# Patient Record
Sex: Male | Born: 1965
Health system: Southern US, Community
[De-identification: ages and names within clinical notes are randomized; demographics above are authoritative.]

## PROBLEM LIST (undated history)

## (undated) DIAGNOSIS — E669 Obesity, unspecified: Secondary | ICD-10-CM

## (undated) DIAGNOSIS — Z720 Tobacco use: Secondary | ICD-10-CM

## (undated) DIAGNOSIS — M1990 Unspecified osteoarthritis, unspecified site: Secondary | ICD-10-CM

## (undated) DIAGNOSIS — E785 Hyperlipidemia, unspecified: Secondary | ICD-10-CM

## (undated) HISTORY — DX: Unspecified osteoarthritis, unspecified site: M19.90

## (undated) HISTORY — PX: BACK SURGERY: SHX140

## (undated) HISTORY — DX: Tobacco use: Z72.0

## (undated) HISTORY — DX: Hyperlipidemia, unspecified: E78.5

## (undated) HISTORY — PX: FINGER SURGERY: SHX640

## (undated) HISTORY — DX: Obesity, unspecified: E66.9

## (undated) HISTORY — PX: KNEE SURGERY: SHX244

---

## 2004-03-27 ENCOUNTER — Other Ambulatory Visit: Payer: Self-pay

## 2004-11-22 ENCOUNTER — Emergency Department: Payer: Self-pay | Admitting: Emergency Medicine

## 2005-02-21 ENCOUNTER — Emergency Department: Payer: Self-pay | Admitting: Emergency Medicine

## 2005-02-21 ENCOUNTER — Other Ambulatory Visit: Payer: Self-pay

## 2006-08-05 ENCOUNTER — Emergency Department: Payer: Self-pay | Admitting: Emergency Medicine

## 2006-09-22 ENCOUNTER — Emergency Department: Payer: Self-pay | Admitting: Emergency Medicine

## 2007-07-27 ENCOUNTER — Emergency Department: Payer: Self-pay | Admitting: Emergency Medicine

## 2010-08-05 ENCOUNTER — Emergency Department: Payer: Self-pay | Admitting: Emergency Medicine

## 2014-12-22 DIAGNOSIS — E669 Obesity, unspecified: Secondary | ICD-10-CM | POA: Insufficient documentation

## 2014-12-22 DIAGNOSIS — E785 Hyperlipidemia, unspecified: Secondary | ICD-10-CM | POA: Insufficient documentation

## 2014-12-22 DIAGNOSIS — Z720 Tobacco use: Secondary | ICD-10-CM | POA: Insufficient documentation

## 2015-08-01 ENCOUNTER — Encounter: Payer: Self-pay | Admitting: Family Medicine

## 2015-08-01 ENCOUNTER — Ambulatory Visit (INDEPENDENT_AMBULATORY_CARE_PROVIDER_SITE_OTHER): Payer: 59 | Admitting: Family Medicine

## 2015-08-01 VITALS — BP 119/78 | HR 73 | Temp 96.7°F | Ht 71.1 in | Wt 269.0 lb

## 2015-08-01 DIAGNOSIS — M545 Low back pain, unspecified: Secondary | ICD-10-CM

## 2015-08-01 MED ORDER — IBUPROFEN 600 MG PO TABS: 600.0000 mg | ORAL_TABLET | Freq: Three times a day (TID) | ORAL | Status: DC | PRN

## 2015-08-01 MED ORDER — CYCLOBENZAPRINE HCL 5 MG PO TABS: 5.0000 mg | ORAL_TABLET | Freq: Three times a day (TID) | ORAL | Status: DC | PRN

## 2015-08-01 NOTE — Patient Instructions (Signed)

## 2015-08-01 NOTE — Progress Notes (Signed)
BP 119/78 mmHg  Pulse 73  Temp(Src) 96.7 F (35.9 C)  Ht 5' 11.1" (1.806 m)  Wt 269 lb (122.018 kg)  BMI 37.41 kg/m2  SpO2 98%   Subjective:    Patient ID: Fred Tran, male    DOB: November 22, 1965, 49 y.o.   MRN: [id number]  HPI: Fred RogueDonald R Nygaard is a 49 y.o. male  Chief Complaint  Patient presents with  . Back Pain    No injury that patient is aware of, it has been hurting for about a month off and on now   BACK PAIN Duration: 3+ weeks Mechanism of injury: unknown Location: midline and low back Onset: sudden Severity: moderate Quality: dull and aching Frequency: waxes and wanes Radiation: none Aggravating factors: lifting, laying and bending Alleviating factors: nothing Status: stable Treatments attempted: rest, heat, APAP, ibuprofen and aleve  Relief with NSAIDs?: moderate Nighttime pain:  no Paresthesias / decreased sensation:  no Bowel / bladder incontinence:  no Fevers:  no Dysuria / urinary frequency:  no  Relevant past medical, surgical, family and social history reviewed and updated as indicated. Interim medical history since our last visit reviewed. Allergies and medications reviewed and updated.  Review of Systems  Constitutional: Negative.   Respiratory: Negative.   Cardiovascular: Negative.   Gastrointestinal: Negative.   Genitourinary: Negative.   Musculoskeletal: Positive for myalgias and back pain. Negative for joint swelling, arthralgias, gait problem, neck pain and neck stiffness.  Neurological: Negative.   Psychiatric/Behavioral: Negative.    Per HPI unless specifically indicated above     Objective:    BP 119/78 mmHg  Pulse 73  Temp(Src) 96.7 F (35.9 C)  Ht 5' 11.1" (1.806 m)  Wt 269 lb (122.018 kg)  BMI 37.41 kg/m2  SpO2 98%  Wt Readings from Last 3 Encounters:  08/01/15 269 lb (122.018 kg)  12/03/14 279 lb (126.554 kg)    Physical Exam  Constitutional: He is oriented to person, place, and time. He appears well-developed  and well-nourished. No distress.  HENT:  Head: Normocephalic and atraumatic.  Right Ear: Hearing normal.  Left Ear: Hearing normal.  Nose: Nose normal.  Eyes: Conjunctivae and lids are normal. Right eye exhibits no discharge. Left eye exhibits no discharge. No scleral icterus.  Cardiovascular: Normal rate, regular rhythm, normal heart sounds and intact distal pulses.  Exam reveals no gallop and no friction rub.   No murmur heard. Pulmonary/Chest: Effort normal and breath sounds normal. No respiratory distress. He has no wheezes. He has no rales. He exhibits no tenderness.  Neurological: He is alert and oriented to person, place, and time.  Skin: Skin is warm, dry and intact. No rash noted. No erythema. No pallor.  Psychiatric: He has a normal mood and affect. His speech is normal and behavior is normal. Judgment and thought content normal. Cognition and memory are normal.  Nursing note and vitals reviewed. Back Exam:    Inspection:  Normal spinal curvature.  No deformity, ecchymosis, erythema, or lesions     Palpation:     Midline spinal tenderness: no none      Paralumbar tenderness: yes Left     Parathoracic tenderness: no      Buttocks tenderness: no     Range of Motion:      Flexion: Fingers to Knees     Extension:Decreased     Lateral bending:Decreased to the L, normal to the R    Rotation:Decreased to the L, normal to the R    Neuro Exam:Lower  extremity DTRs normal & symmetric.  Strength and sensation intact.    Special Tests:      Straight leg raise:negative   No results found for this or any previous visit.    Assessment & Plan:   Problem List Items Addressed This Visit    None    Visit Diagnoses    Left-sided low back pain without sciatica    -  Primary    Flexeril and ibuprofen. Exercises and heat. Check in in 2 weeks if not better.     Relevant Medications    ibuprofen (ADVIL,MOTRIN) 600 MG tablet    cyclobenzaprine (FLEXERIL) 5 MG tablet        Follow up  plan: Return in about 2 weeks (around 08/15/2015) for Follow up back pain.

## 2015-08-16 ENCOUNTER — Ambulatory Visit: Payer: 59 | Admitting: Family Medicine

## 2016-03-05 ENCOUNTER — Encounter: Payer: Self-pay | Admitting: Family Medicine

## 2016-03-05 ENCOUNTER — Ambulatory Visit (INDEPENDENT_AMBULATORY_CARE_PROVIDER_SITE_OTHER): Payer: 59 | Admitting: Family Medicine

## 2016-03-05 VITALS — BP 132/68 | HR 59 | Temp 97.8°F | Ht 71.1 in | Wt 274.0 lb

## 2016-03-05 DIAGNOSIS — Z23 Encounter for immunization: Secondary | ICD-10-CM | POA: Diagnosis not present

## 2016-03-05 DIAGNOSIS — Z1211 Encounter for screening for malignant neoplasm of colon: Secondary | ICD-10-CM

## 2016-03-05 DIAGNOSIS — H66001 Acute suppurative otitis media without spontaneous rupture of ear drum, right ear: Secondary | ICD-10-CM

## 2016-03-05 MED ORDER — AMOXICILLIN 875 MG PO TABS: 875.0000 mg | ORAL_TABLET | Freq: Two times a day (BID) | ORAL | Status: DC

## 2016-03-05 MED ORDER — IBUPROFEN 600 MG PO TABS: 600.0000 mg | ORAL_TABLET | Freq: Three times a day (TID) | ORAL | Status: DC | PRN

## 2016-03-05 NOTE — Progress Notes (Signed)
BP 132/68 mmHg  Pulse 59  Temp(Src) 97.8 F (36.6 C)  Ht 5' 11.1" (1.806 m)  Wt 274 lb (124.286 kg)  BMI 38.11 kg/m2  SpO2 97%   Subjective:    Patient ID: Fred Tran, male    DOB: 05-25-1966, 50 y.o.   MRN: [id number]  HPI: Fred Tran is a 50 y.o. male  Chief Complaint  Patient presents with  . URI   UPPER RESPIRATORY TRACT INFECTION Duration: about a week Worst symptom: clogged ears Fever: no Cough: no Shortness of breath: no Wheezing: no Chest pain: no Chest tightness: no Chest congestion: no Nasal congestion: yes Runny nose: yes Post nasal drip: yes Sneezing: yes Sore throat: no Swollen glands: no Sinus pressure: yes Headache: no Face pain: no Toothache: no Ear pain: yes left Ear pressure: yes bilateral Eyes red/itching:no Eye drainage/crusting: no  Vomiting: no Rash: no Fatigue: yes Sick contacts: yes Strep contacts: no  Context: better Recurrent sinusitis: no Relief with OTC cold/cough medications: no  Treatments attempted: none   Relevant past medical, surgical, family and social history reviewed and updated as indicated. Interim medical history since our last visit reviewed. Allergies and medications reviewed and updated.  Review of Systems  Constitutional: Negative.   HENT: Positive for ear pain. Negative for congestion, dental problem, drooling, ear discharge, facial swelling, hearing loss, mouth sores, nosebleeds, postnasal drip, rhinorrhea, sinus pressure, sneezing, sore throat, tinnitus, trouble swallowing and voice change.   Respiratory: Negative.   Cardiovascular: Negative.   Psychiatric/Behavioral: Negative.     Per HPI unless specifically indicated above     Objective:    BP 132/68 mmHg  Pulse 59  Temp(Src) 97.8 F (36.6 C)  Ht 5' 11.1" (1.806 m)  Wt 274 lb (124.286 kg)  BMI 38.11 kg/m2  SpO2 97%  Wt Readings from Last 3 Encounters:  03/05/16 274 lb (124.286 kg)  08/01/15 269 lb (122.018 kg)  12/03/14 279 lb  (126.554 kg)    Physical Exam  Constitutional: He is oriented to person, place, and time. He appears well-developed and well-nourished. No distress.  HENT:  Head: Normocephalic and atraumatic.  Right Ear: Hearing and external ear normal. Tympanic membrane is erythematous and bulging. A middle ear effusion is present.  Left Ear: Hearing, tympanic membrane, external ear and ear canal normal.  Nose: Nose normal.  Mouth/Throat: Uvula is midline, oropharynx is clear and moist and mucous membranes are normal. No oropharyngeal exudate.  Eyes: Conjunctivae, EOM and lids are normal. Pupils are equal, round, and reactive to light. Right eye exhibits no discharge. Left eye exhibits no discharge. No scleral icterus.  Neck: Normal range of motion. Neck supple. No JVD present. No tracheal deviation present. No thyromegaly present.  Cardiovascular: Normal rate, regular rhythm, normal heart sounds and intact distal pulses.  Exam reveals no gallop and no friction rub.   No murmur heard. Pulmonary/Chest: Effort normal and breath sounds normal. No stridor. No respiratory distress. He has no wheezes. He has no rales. He exhibits no tenderness.  Musculoskeletal: Normal range of motion.  Lymphadenopathy:    He has no cervical adenopathy.  Neurological: He is alert and oriented to person, place, and time.  Skin: Skin is warm, dry and intact. No rash noted. He is not diaphoretic. No erythema. No pallor.  Psychiatric: He has a normal mood and affect. His speech is normal and behavior is normal. Judgment and thought content normal. Cognition and memory are normal.  Nursing note and vitals reviewed.   No  results found for this or any previous visit.    Assessment & Plan:   Problem List Items Addressed This Visit    None    Visit Diagnoses    Acute suppurative otitis media of right ear without spontaneous rupture of tympanic membrane, recurrence not specified    -  Primary    Will treat with amoxicillin. Call  if not getting better or getting worse.     Relevant Medications    amoxicillin (AMOXIL) 875 MG tablet    Immunization due        Tdap given today.    Relevant Orders    Tdap vaccine greater than or equal to 7yo IM (Completed)    Screening for colon cancer        Referral given today.    Relevant Orders    Ambulatory referral to General Surgery        Follow up plan: Return if symptoms worsen or fail to improve.

## 2016-03-05 NOTE — Patient Instructions (Signed)
Tdap Vaccine (Tetanus, Diphtheria and Pertussis): What You Need to Know 1. Why get vaccinated? Tetanus, diphtheria and pertussis are very serious diseases. Tdap vaccine can protect us from these diseases. And, Tdap vaccine given to pregnant women can protect newborn babies against pertussis. TETANUS (Lockjaw) is rare in the [place] today. It causes painful muscle tightening and stiffness, usually all over the body.  It can lead to tightening of muscles in the head and neck so you can't open your mouth, swallow, or sometimes even breathe. Tetanus kills about 1 out of 10 people who are infected even after receiving the best medical care. DIPHTHERIA is also rare in the [place] today. It can cause a thick coating to form in the back of the throat.  It can lead to breathing problems, heart failure, paralysis, and death. PERTUSSIS (Whooping Cough) causes severe coughing spells, which can cause difficulty breathing, vomiting and disturbed sleep.  It can also lead to weight loss, incontinence, and rib fractures. Up to 2 in 100 adolescents and 5 in 100 adults with pertussis are hospitalized or have complications, which could include pneumonia or death. These diseases are caused by bacteria. Diphtheria and pertussis are spread from person to person through secretions from coughing or sneezing. Tetanus enters the body through cuts, scratches, or wounds. Before vaccines, as many as 200,000 cases of diphtheria, 200,000 cases of pertussis, and hundreds of cases of tetanus, were reported in the [place] each year. Since vaccination began, reports of cases for tetanus and diphtheria have dropped by about 99% and for pertussis by about 80%. 2. Tdap vaccine Tdap vaccine can protect adolescents and adults from tetanus, diphtheria, and pertussis. One dose of Tdap is routinely given at age 11 or 12. People who did not get Tdap at that age should get it as soon as possible. Tdap is especially important  for healthcare professionals and anyone having close contact with a baby younger than 12 months. Pregnant women should get a dose of Tdap during every pregnancy, to protect the newborn from pertussis. Infants are most at risk for severe, life-threatening complications from pertussis. Another vaccine, called Td, protects against tetanus and diphtheria, but not pertussis. A Td booster should be given every 10 years. Tdap may be given as one of these boosters if you have never gotten Tdap before. Tdap may also be given after a severe cut or burn to prevent tetanus infection. Your doctor or the person giving you the vaccine can give you more information. Tdap may safely be given at the same time as other vaccines. 3. Some people should not get this vaccine  A person who has ever had a life-threatening allergic reaction after a previous dose of any diphtheria, tetanus or pertussis containing vaccine, OR has a severe allergy to any part of this vaccine, should not get Tdap vaccine. Tell the person giving the vaccine about any severe allergies.  Anyone who had coma or long repeated seizures within 7 days after a childhood dose of DTP or DTaP, or a previous dose of Tdap, should not get Tdap, unless a cause other than the vaccine was found. They can still get Td.  Talk to your doctor if you:  have seizures or another nervous system problem,  had severe pain or swelling after any vaccine containing diphtheria, tetanus or pertussis,  ever had a condition called Guillain-Barr Syndrome (GBS),  aren't feeling well on the day the shot is scheduled. 4. Risks With any medicine, including vaccines, there is   a chance of side effects. These are usually mild and go away on their own. Serious reactions are also possible but are rare. Most people who get Tdap vaccine do not have any problems with it. Mild problems following Tdap (Did not interfere with activities)  Pain where the shot was given (about 3 in 4  adolescents or 2 in 3 adults)  Redness or swelling where the shot was given (about 1 person in 5)  Mild fever of at least 100.4F (up to about 1 in 25 adolescents or 1 in 100 adults)  Headache (about 3 or 4 people in 10)  Tiredness (about 1 person in 3 or 4)  Nausea, vomiting, diarrhea, stomach ache (up to 1 in 4 adolescents or 1 in 10 adults)  Chills, sore joints (about 1 person in 10)  Body aches (about 1 person in 3 or 4)  Rash, swollen glands (uncommon) Moderate problems following Tdap (Interfered with activities, but did not require medical attention)  Pain where the shot was given (up to 1 in 5 or 6)  Redness or swelling where the shot was given (up to about 1 in 16 adolescents or 1 in 12 adults)  Fever over 102F (about 1 in 100 adolescents or 1 in 250 adults)  Headache (about 1 in 7 adolescents or 1 in 10 adults)  Nausea, vomiting, diarrhea, stomach ache (up to 1 or 3 people in 100)  Swelling of the entire arm where the shot was given (up to about 1 in 500). Severe problems following Tdap (Unable to perform usual activities; required medical attention)  Swelling, severe pain, bleeding and redness in the arm where the shot was given (rare). Problems that could happen after any vaccine:  People sometimes faint after a medical procedure, including vaccination. Sitting or lying down for about 15 minutes can help prevent fainting, and injuries caused by a fall. Tell your doctor if you feel dizzy, or have vision changes or ringing in the ears.  Some people get severe pain in the shoulder and have difficulty moving the arm where a shot was given. This happens very rarely.  Any medication can cause a severe allergic reaction. Such reactions from a vaccine are very rare, estimated at fewer than 1 in a million doses, and would happen within a few minutes to a few hours after the vaccination. As with any medicine, there is a very remote chance of a vaccine causing a serious  injury or death. The safety of vaccines is always being monitored. For more information, visit: www.cdc.gov/vaccinesafety/ 5. What if there is a serious problem? What should I look for?  Look for anything that concerns you, such as signs of a severe allergic reaction, very high fever, or unusual behavior.  Signs of a severe allergic reaction can include hives, swelling of the face and throat, difficulty breathing, a fast heartbeat, dizziness, and weakness. These would usually start a few minutes to a few hours after the vaccination. What should I do?  If you think it is a severe allergic reaction or other emergency that can't wait, call 9-1-1 or get the person to the nearest hospital. Otherwise, call your doctor.  Afterward, the reaction should be reported to the Vaccine Adverse Event Reporting System (VAERS). Your doctor might file this report, or you can do it yourself through the VAERS web site at www.vaers.hhs.gov, or by calling 1-800-822-7967. VAERS does not give medical advice.  6. The National Vaccine Injury Compensation Program The National Vaccine Injury Compensation Program (  VICP) is a federal program that was created to compensate people who may have been injured by certain vaccines. Persons who believe they may have been injured by a vaccine can learn about the program and about filing a claim by calling 1-430-786-4900 or visiting the VICP website at SpiritualWord.atwww.hrsa.gov/vaccinecompensation. There is a time limit to file a claim for compensation. 7. How can I learn more?  Ask your doctor. He or she can give you the vaccine package insert or suggest other sources of information.  Call your local or state health department.  Contact the Centers for Disease Control and Prevention (CDC):  Call (825)574-13591-810 551 4897 (1-800-CDC-INFO) or  Visit CDC's website at PicCapture.uywww.cdc.gov/vaccines CDC Tdap Vaccine VIS (11/03/13)   This information is not intended to replace advice given to you by your health care  provider. Make sure you discuss any questions you have with your health care provider.   Document Released: 02/26/2012 Document Revised: 09/17/2014 Document Reviewed: 12/09/2013 Elsevier Interactive Patient Education 2016 Elsevier Inc. Otitis Media, Adult Otitis media is redness, soreness, and puffiness (swelling) in the space just behind your eardrum (middle ear). It may be caused by allergies or infection. It often happens along with a cold. HOME CARE  Take your medicine as told. Finish it even if you start to feel better.  Only take over-the-counter or prescription medicines for pain, discomfort, or fever as told by your doctor.  Follow up with your doctor as told. GET HELP IF:  You have otitis media only in one ear, or bleeding from your nose, or both.  You notice a lump on your neck.  You are not getting better in 3-5 days.  You feel worse instead of better. GET HELP RIGHT AWAY IF:   You have pain that is not helped with medicine.  You have puffiness, redness, or pain around your ear.  You get a stiff neck.  You cannot move part of your face (paralysis).  You notice that the bone behind your ear hurts when you touch it. MAKE SURE YOU:   Understand these instructions.  Will watch your condition.  Will get help right away if you are not doing well or get worse.   This information is not intended to replace advice given to you by your health care provider. Make sure you discuss any questions you have with your health care provider.   Document Released: 02/13/2008 Document Revised: 09/17/2014 Document Reviewed: 03/24/2013 Elsevier Interactive Patient Education [employer]2016 Elsevier Inc.

## 2016-06-19 ENCOUNTER — Other Ambulatory Visit: Payer: Self-pay | Admitting: Family Medicine

## 2016-08-08 ENCOUNTER — Ambulatory Visit (INDEPENDENT_AMBULATORY_CARE_PROVIDER_SITE_OTHER): Payer: 59

## 2016-08-08 DIAGNOSIS — Z23 Encounter for immunization: Secondary | ICD-10-CM | POA: Diagnosis not present

## 2016-10-02 ENCOUNTER — Ambulatory Visit (INDEPENDENT_AMBULATORY_CARE_PROVIDER_SITE_OTHER): Payer: 59 | Admitting: Family Medicine

## 2016-10-02 ENCOUNTER — Encounter: Payer: Self-pay | Admitting: Family Medicine

## 2016-10-02 ENCOUNTER — Other Ambulatory Visit: Payer: Self-pay

## 2016-10-02 VITALS — BP 138/70 | HR 75 | Temp 97.2°F | Wt 281.0 lb

## 2016-10-02 DIAGNOSIS — H1031 Unspecified acute conjunctivitis, right eye: Secondary | ICD-10-CM

## 2016-10-02 MED ORDER — ERYTHROMYCIN 5 MG/GM OP OINT: 1.0000 "application " | TOPICAL_OINTMENT | Freq: Every day | OPHTHALMIC | 0 refills | Status: DC

## 2016-10-02 NOTE — Patient Instructions (Signed)
Follow up as needed

## 2016-10-02 NOTE — Progress Notes (Signed)
   BP 138/70   Pulse 75   Temp 97.2 F (36.2 C)   Wt 281 lb (127.5 kg)   SpO2 97%   BMI 39.08 kg/m    Subjective:    Patient ID: Fred Tran, male    DOB: [birth year], 51 y.o.   MRN: [id number]  HPI: Fred RogueDonald R Bouldin is a 51 y.o. male  Chief Complaint  Patient presents with  . Eye Pain    x 3 days, right eye. swollen, matted up with drainage, no burning, no pain.    Patient presents with 3 day history of right eye swelling, redness, and thick drainage. States eye is crusted over in the mornings. Denies visual disturbance, HA, N/V. Has not been trying anything OTC for sxs. No sick contacts that he is aware of.   Relevant past medical, surgical, family and social history reviewed and updated as indicated. Interim medical history since our last visit reviewed. Allergies and medications reviewed and updated.  Review of Systems  Constitutional: Negative.   HENT: Negative.   Eyes: Positive for discharge and redness.  Respiratory: Negative.   Cardiovascular: Negative.   Gastrointestinal: Negative.   Genitourinary: Negative.   Musculoskeletal: Negative.   Neurological: Negative.   Psychiatric/Behavioral: Negative.     Per HPI unless specifically indicated above     Objective:    BP 138/70   Pulse 75   Temp 97.2 F (36.2 C)   Wt 281 lb (127.5 kg)   SpO2 97%   BMI 39.08 kg/m   Wt Readings from Last 3 Encounters:  10/02/16 281 lb (127.5 kg)  03/05/16 274 lb (124.3 kg)  08/01/15 269 lb (122 kg)    Physical Exam  Constitutional: He is oriented to person, place, and time. He appears well-developed and well-nourished. No distress.  HENT:  Head: Atraumatic.  Right Ear: External ear normal.  Left Ear: External ear normal.  Mouth/Throat: Oropharynx is clear and moist.  Eyes: EOM are normal. Pupils are equal, round, and reactive to light. No scleral icterus.  Right conjunctiva erythematous Some crusting on lashes of right eye Mild lower right lid edema    Cardiovascular: Normal rate.   Pulmonary/Chest: Effort normal. No respiratory distress.  Musculoskeletal: Normal range of motion.  Neurological: He is alert and oriented to person, place, and time.  Skin: Skin is warm and dry.  Psychiatric: He has a normal mood and affect. His behavior is normal.  Nursing note and vitals reviewed.   Visual Acuity: without correction R - 20/20 L - 20/20  No results found for this or any previous visit.    Assessment & Plan:   Problem List Items Addressed This Visit    None    Visit Diagnoses    Acute bacterial conjunctivitis of right eye    -  Primary   Will treat with erythromycin ointment. Discussed compresses prn for comfort. Follow up if worsening or no improvement. Return precautions discussed       Follow up plan: Return if symptoms worsen or fail to improve.

## 2016-10-22 ENCOUNTER — Other Ambulatory Visit: Payer: Self-pay | Admitting: Family Medicine

## 2016-10-30 ENCOUNTER — Other Ambulatory Visit: Payer: Self-pay | Admitting: Family Medicine

## 2016-10-30 ENCOUNTER — Telehealth: Payer: Self-pay | Admitting: Family Medicine

## 2016-10-30 MED ORDER — VARENICLINE TARTRATE 1 MG PO TABS: 1.0000 mg | ORAL_TABLET | Freq: Two times a day (BID) | ORAL | 4 refills | Status: DC

## 2016-10-30 MED ORDER — VARENICLINE TARTRATE 0.5 MG X 11 & 1 MG X 42 PO MISC: ORAL | 0 refills | Status: DC

## 2016-10-30 NOTE — Telephone Encounter (Signed)
Patient would like a script  for Chantix (unsure of sp).  Thanks   [phone]

## 2016-10-31 NOTE — Telephone Encounter (Signed)
Left message on patients designated phone chantix was sent in to [facility].

## 2016-11-08 ENCOUNTER — Ambulatory Visit (INDEPENDENT_AMBULATORY_CARE_PROVIDER_SITE_OTHER): Payer: 59 | Admitting: Family Medicine

## 2016-11-08 ENCOUNTER — Encounter: Payer: Self-pay | Admitting: Family Medicine

## 2016-11-08 ENCOUNTER — Telehealth: Payer: Self-pay

## 2016-11-08 VITALS — BP 146/76 | HR 88 | Temp 99.2°F | Resp 17 | Ht 71.1 in | Wt 278.0 lb

## 2016-11-08 DIAGNOSIS — J0100 Acute maxillary sinusitis, unspecified: Secondary | ICD-10-CM

## 2016-11-08 MED ORDER — AMOXICILLIN-POT CLAVULANATE 875-125 MG PO TABS: 1.0000 | ORAL_TABLET | Freq: Two times a day (BID) | ORAL | 0 refills | Status: DC

## 2016-11-08 NOTE — Telephone Encounter (Signed)
[facility] fax from [facility] about Chantix starting month needing P.A. Rochel BromeP.A. Was initiated via [email or web address] Case ID: [id number]

## 2016-11-08 NOTE — Progress Notes (Signed)
BP (!) 146/76 (BP Location: Right Arm, Patient Position: Sitting, Cuff Size: Large)   Pulse 88   Temp 99.2 F (37.3 C) (Oral)   Resp 17   Ht 5' 11.1" (1.806 m)   Wt 278 lb (126.1 kg)   SpO2 98%   BMI 38.66 kg/m    Subjective:    Patient ID: Fred Tran, male    DOB: 04/23/1966, 51 y.o.   MRN: [id number]  HPI: Fred Tran is a 51 y.o. male  Chief Complaint  Patient presents with  . Sinusitis    Left side onset 3 days   UPPER RESPIRATORY TRACT INFECTION Duration: 3 days Worst symptom: congestion, ear pain- all on the L Fever: yes- subjective Cough: yes Shortness of breath: no Wheezing: no Chest pain: no Chest tightness: no Chest congestion: no Nasal congestion: yes Runny nose: yes Post nasal drip: yes Sneezing: yes Sore throat: no Swollen glands: no Sinus pressure: yes Headache: no Face pain: yes Toothache: no Ear pain: yes left Ear pressure: yes left Eyes red/itching:no Eye drainage/crusting: no  Vomiting: no Rash: no Fatigue: yes Sick contacts: no Strep contacts: no  Context: worse Recurrent sinusitis: no Relief with OTC cold/cough medications: no  Treatments attempted: tylenol   Relevant past medical, surgical, family and social history reviewed and updated as indicated. Interim medical history since our last visit reviewed. Allergies and medications reviewed and updated.  Review of Systems  Constitutional: Positive for fatigue and fever. Negative for activity change, appetite change, chills, diaphoresis and unexpected weight change.  HENT: Positive for congestion, ear pain, postnasal drip, rhinorrhea, sinus pain, sinus pressure and sneezing. Negative for dental problem, drooling, ear discharge, facial swelling, hearing loss, mouth sores, nosebleeds, sore throat, tinnitus, trouble swallowing and voice change.   Respiratory: Negative.   Cardiovascular: Negative.   Psychiatric/Behavioral: Negative.     Per HPI unless specifically  indicated above     Objective:    BP (!) 146/76 (BP Location: Right Arm, Patient Position: Sitting, Cuff Size: Large)   Pulse 88   Temp 99.2 F (37.3 C) (Oral)   Resp 17   Ht 5' 11.1" (1.806 m)   Wt 278 lb (126.1 kg)   SpO2 98%   BMI 38.66 kg/m   Wt Readings from Last 3 Encounters:  11/08/16 278 lb (126.1 kg)  10/02/16 281 lb (127.5 kg)  03/05/16 274 lb (124.3 kg)    Physical Exam  Constitutional: He is oriented to person, place, and time. He appears well-developed and well-nourished. No distress.  HENT:  Head: Normocephalic and atraumatic.  Right Ear: Hearing and external ear normal.  Left Ear: Hearing and external ear normal.  Nose: Mucosal edema, rhinorrhea and sinus tenderness present. No nose lacerations or nasal deformity. Right sinus exhibits no maxillary sinus tenderness. Left sinus exhibits maxillary sinus tenderness. Left sinus exhibits no frontal sinus tenderness.  Mouth/Throat: Oropharynx is clear and moist. No oropharyngeal exudate.  Pus coming out of meatus on the L maxillary sinus  Eyes: Conjunctivae, EOM and lids are normal. Pupils are equal, round, and reactive to light. Right eye exhibits no discharge. Left eye exhibits no discharge. No scleral icterus.  Neck: Normal range of motion. Neck supple. No JVD present. No tracheal deviation present. No thyromegaly present.  Cardiovascular: Normal rate, regular rhythm, normal heart sounds and intact distal pulses.  Exam reveals no gallop and no friction rub.   No murmur heard. Pulmonary/Chest: Effort normal. No stridor. No respiratory distress. He has wheezes. He has  no rales. He exhibits no tenderness.  Musculoskeletal: Normal range of motion.  Lymphadenopathy:    He has cervical adenopathy.  Neurological: He is alert and oriented to person, place, and time.  Skin: Skin is warm, dry and intact. No rash noted. He is not diaphoretic. No erythema. No pallor.  Psychiatric: He has a normal mood and affect. His speech is  normal and behavior is normal. Judgment and thought content normal. Cognition and memory are normal.  Vitals reviewed.   No results found for this or any previous visit.    Assessment & Plan:   Problem List Items Addressed This Visit    None    Visit Diagnoses    Acute non-recurrent maxillary sinusitis    -  Primary   Will treat with augmentin. Call with any concerns or if not getting better or getting worse.    Relevant Medications   amoxicillin-clavulanate (AUGMENTIN) 875-125 MG tablet       Follow up plan: Return if symptoms worsen or fail to improve.

## 2016-11-22 ENCOUNTER — Other Ambulatory Visit: Payer: Self-pay | Admitting: Family Medicine

## 2017-04-08 ENCOUNTER — Other Ambulatory Visit: Payer: Self-pay | Admitting: Family Medicine

## 2017-07-25 ENCOUNTER — Ambulatory Visit (INDEPENDENT_AMBULATORY_CARE_PROVIDER_SITE_OTHER): Payer: Self-pay

## 2017-07-25 DIAGNOSIS — Z23 Encounter for immunization: Secondary | ICD-10-CM

## 2017-11-14 ENCOUNTER — Ambulatory Visit (INDEPENDENT_AMBULATORY_CARE_PROVIDER_SITE_OTHER): Payer: BLUE CROSS/BLUE SHIELD | Admitting: Family Medicine

## 2017-11-14 ENCOUNTER — Encounter: Payer: Self-pay | Admitting: Family Medicine

## 2017-11-14 VITALS — BP 162/75 | HR 78 | Temp 97.8°F | Wt 289.2 lb

## 2017-11-14 DIAGNOSIS — J0120 Acute ethmoidal sinusitis, unspecified: Secondary | ICD-10-CM

## 2017-11-14 MED ORDER — AMOXICILLIN-POT CLAVULANATE 875-125 MG PO TABS: 1.0000 | ORAL_TABLET | Freq: Two times a day (BID) | ORAL | 0 refills | Status: DC

## 2017-11-14 NOTE — Progress Notes (Signed)
   BP (!) 162/75   Pulse 78   Temp 97.8 F (36.6 C) (Oral)   Wt 289 lb 3.2 oz (131.2 kg)   SpO2 97%   BMI 40.22 kg/m    Subjective:    Patient ID: Fred Tran, male    DOB: 06-09-1966, 52 y.o.   MRN: [id number]  HPI: Fred Tran is a 52 y.o. male  Chief Complaint  Patient presents with  . URI    pt states he has had a cough, congestion, and sinus pressure for a few weeks   Congestion, facial pain and pressure worst on the left, headaches, ear pain x 2-3 weeks. Denies fever, chills, CP, SOB, body aches. Not taking anything OTC for sxs. Lots of sick family members lately.   Relevant past medical, surgical, family and social history reviewed and updated as indicated. Interim medical history since our last visit reviewed. Allergies and medications reviewed and updated.  Review of Systems  Per HPI unless specifically indicated above     Objective:    BP (!) 162/75   Pulse 78   Temp 97.8 F (36.6 C) (Oral)   Wt 289 lb 3.2 oz (131.2 kg)   SpO2 97%   BMI 40.22 kg/m   Wt Readings from Last 3 Encounters:  11/14/17 289 lb 3.2 oz (131.2 kg)  11/08/16 278 lb (126.1 kg)  10/02/16 281 lb (127.5 kg)    Physical Exam  Constitutional: He is oriented to person, place, and time. He appears well-developed and well-nourished. No distress.  HENT:  Head: Atraumatic.  B/l middle ear effusion Ethmoidal sinus ttp Oropharynx and nasal mucosa erythematous and edematous  Eyes: Conjunctivae are normal. Pupils are equal, round, and reactive to light. No scleral icterus.  Neck: Normal range of motion. Neck supple.  Cardiovascular: Normal rate and normal heart sounds.  Pulmonary/Chest: Effort normal and breath sounds normal. He has no wheezes.  Musculoskeletal: Normal range of motion.  Neurological: He is alert and oriented to person, place, and time.  Skin: Skin is warm and dry.  Psychiatric: He has a normal mood and affect. His behavior is normal.  Nursing note and vitals  reviewed.   No results found for this or any previous visit.    Assessment & Plan:   Problem List Items Addressed This Visit    None    Visit Diagnoses    Acute ethmoidal sinusitis, recurrence not specified    -  Primary   Will tx with augmentin, take mucinex BID OTC and use humidifier, sinus rinses, flonase. F/u if worsening or no improvement   Relevant Medications   amoxicillin-clavulanate (AUGMENTIN) 875-125 MG tablet       Follow up plan: Return if symptoms worsen or fail to improve.

## 2017-11-17 NOTE — Patient Instructions (Signed)
Follow up as needed

## 2018-02-19 ENCOUNTER — Other Ambulatory Visit: Payer: Self-pay | Admitting: Family Medicine

## 2018-05-02 ENCOUNTER — Encounter: Payer: BLUE CROSS/BLUE SHIELD | Admitting: Unknown Physician Specialty

## 2018-06-19 ENCOUNTER — Encounter: Payer: Self-pay | Admitting: Family Medicine

## 2018-06-19 ENCOUNTER — Ambulatory Visit (INDEPENDENT_AMBULATORY_CARE_PROVIDER_SITE_OTHER): Payer: BLUE CROSS/BLUE SHIELD | Admitting: Family Medicine

## 2018-06-19 ENCOUNTER — Ambulatory Visit
Admission: RE | Admit: 2018-06-19 | Discharge: 2018-06-19 | Disposition: A | Discharge status: Home / Self Care | Payer: BLUE CROSS/BLUE SHIELD | Source: Ambulatory Visit | Attending: Family Medicine | Admitting: Family Medicine

## 2018-06-19 VITALS — BP 167/73 | HR 65 | Temp 98.6°F | Wt 286.2 lb

## 2018-06-19 DIAGNOSIS — M5441 Lumbago with sciatica, right side: Secondary | ICD-10-CM

## 2018-06-19 DIAGNOSIS — M25551 Pain in right hip: Secondary | ICD-10-CM | POA: Diagnosis not present

## 2018-06-19 DIAGNOSIS — Z23 Encounter for immunization: Secondary | ICD-10-CM | POA: Diagnosis not present

## 2018-06-19 DIAGNOSIS — M47816 Spondylosis without myelopathy or radiculopathy, lumbar region: Secondary | ICD-10-CM | POA: Diagnosis not present

## 2018-06-19 DIAGNOSIS — Z1211 Encounter for screening for malignant neoplasm of colon: Secondary | ICD-10-CM | POA: Diagnosis not present

## 2018-06-19 DIAGNOSIS — M419 Scoliosis, unspecified: Secondary | ICD-10-CM | POA: Insufficient documentation

## 2018-06-19 DIAGNOSIS — M48061 Spinal stenosis, lumbar region without neurogenic claudication: Secondary | ICD-10-CM | POA: Diagnosis not present

## 2018-06-19 MED ORDER — TRAMADOL HCL 50 MG PO TABS: 50.0000 mg | ORAL_TABLET | Freq: Three times a day (TID) | ORAL | 0 refills | Status: AC | PRN

## 2018-06-19 NOTE — Progress Notes (Signed)
BP (!) 167/73 (BP Location: Left Arm, Patient Position: Sitting, Cuff Size: Large)   Pulse 65   Temp 98.6 F (37 C)   Wt 286 lb 3 oz (129.8 kg)   SpO2 98%   BMI 39.80 kg/m    Subjective:    Patient ID: Fred Tran, male    DOB: 24-Oct-1965, 52 y.o.   MRN: [id number]  HPI: Fred Tran is a 52 y.o. male  Chief Complaint  Patient presents with  . Hip Pain   HIP PAIN Duration: 2 weeks Involved hip: right  Mechanism of injury: unknown Location: in the joint Onset: sudden  Severity: sitting 3/10, walking is > than that  Quality: burning Frequency: constant Radiation: yes Aggravating factors: weight bearing   Alleviating factors: staying of it   Status: stable Treatments attempted: rest, heat and ibuprofen   Relief with NSAIDs?: no Weakness with weight bearing: yes Weakness with walking: yes Paresthesias / decreased sensation: no Swelling: no Redness:no Fevers: no  Relevant past medical, surgical, family and social history reviewed and updated as indicated. Interim medical history since our last visit reviewed. Allergies and medications reviewed and updated.  Review of Systems  Constitutional: Negative.   Respiratory: Negative.   Cardiovascular: Negative.   Musculoskeletal: Positive for arthralgias, gait problem and myalgias. Negative for back pain, joint swelling, neck pain and neck stiffness.  Skin: Negative.   Psychiatric/Behavioral: Negative.     Per HPI unless specifically indicated above     Objective:    BP (!) 167/73 (BP Location: Left Arm, Patient Position: Sitting, Cuff Size: Large)   Pulse 65   Temp 98.6 F (37 C)   Wt 286 lb 3 oz (129.8 kg)   SpO2 98%   BMI 39.80 kg/m   Wt Readings from Last 3 Encounters:  06/19/18 286 lb 3 oz (129.8 kg)  11/14/17 289 lb 3.2 oz (131.2 kg)  11/08/16 278 lb (126.1 kg)    Physical Exam  Constitutional: He is oriented to person, place, and time. He appears well-developed and well-nourished. No  distress.  HENT:  Head: Normocephalic and atraumatic.  Right Ear: Hearing normal.  Left Ear: Hearing normal.  Nose: Nose normal.  Eyes: Conjunctivae and lids are normal. Right eye exhibits no discharge. Left eye exhibits no discharge. No scleral icterus.  Cardiovascular: Normal rate, regular rhythm, normal heart sounds and intact distal pulses. Exam reveals no gallop and no friction rub.  No murmur heard. Pulmonary/Chest: Effort normal and breath sounds normal. No stridor. No respiratory distress. He has no wheezes. He has no rales. He exhibits no tenderness.  Musculoskeletal: He exhibits tenderness. He exhibits no edema or deformity.  Neurological: He is alert and oriented to person, place, and time.  Skin: Skin is warm, dry and intact. Capillary refill takes less than 2 seconds. No rash noted. He is not diaphoretic. No erythema. No pallor.  Psychiatric: He has a normal mood and affect. His speech is normal and behavior is normal. Judgment and thought content normal. Cognition and memory are normal.  Nursing note and vitals reviewed. Hip Exam: Right     Tenderness to palpation: yes     Greater trochanter: no      Anterior superior iliac spine: no     Anterior hip: yes     Iliac crest: no     Iliac tubercle: no     Pubic tubercle: no     SI joint: yes      Range of Motion:  Flexion: Decreased    Extension: Decreased    Abduction: Decreased    Adduction: Decreased    Internal rotation: Decreased    External rotation: Decreased     Muscle Strength:  5/5 bilaterally  Back Exam:    Inspection:  Normal spinal curvature.  No deformity, ecchymosis, erythema, or lesions      Palpation:     Midline spinal tenderness: no      Paralumbar tenderness: no      Parathoracic tenderness: no      Buttocks tenderness: no     Range of Motion:      Flexion: Fingers to Knees     Extension:Decreased     Lateral bending:Decreased    Rotation:Decreased    Neuro Exam:Lower extremity DTRs  normal & symmetric.  Strength and sensation intact.    Special Tests:      Straight leg raise:negative   No results found for this or any previous visit.    Assessment & Plan:   Problem List Items Addressed This Visit    None    Visit Diagnoses    Right hip pain    -  Primary   In significant discomfort. Will obtain x-rays. Await results. Out of work for the next few days. Rx for tramadol provided. Call with concerns.    Relevant Orders   DG HIP UNILAT WITH PELVIS 2-3 VIEWS RIGHT   Screening for colon cancer       Referral to GI made today.   Relevant Orders   Ambulatory referral to Gastroenterology   Immunization due       Flu shot given today.   Relevant Orders   Flu Vaccine QUAD 6+ mos PF IM (Fluarix Quad PF) (Completed)   Acute right-sided low back pain with right-sided sciatica       n significant discomfort. Will obtain x-rays. Await results. Out of work for the next few days. Rx for tramadol provided. Call with concerns.    Relevant Medications   traMADol (ULTRAM) 50 MG tablet   Other Relevant Orders   DG Lumbar Spine Complete       Follow up plan: Return in about 2 weeks (around 07/03/2018) for Follow up pain.

## 2018-06-20 ENCOUNTER — Telehealth: Payer: Self-pay | Admitting: Family Medicine

## 2018-06-20 DIAGNOSIS — M5440 Lumbago with sciatica, unspecified side: Principal | ICD-10-CM

## 2018-06-20 DIAGNOSIS — M545 Low back pain, unspecified: Secondary | ICD-10-CM

## 2018-06-20 NOTE — Telephone Encounter (Signed)
Please let him know that his x-rays of his hip were normal, but his back shows arthritis. I'd like to get him into some PT to see if we can help with the pain.

## 2018-06-20 NOTE — Telephone Encounter (Signed)
Left message on machine for pt to return call to the office. CRM created.  

## 2018-06-20 NOTE — Telephone Encounter (Signed)
Patient notified of results- he would like to have the referral to PT to see if it would help.

## 2018-06-20 NOTE — Telephone Encounter (Signed)
Referral placed.

## 2018-06-23 ENCOUNTER — Other Ambulatory Visit: Payer: Self-pay

## 2018-06-23 DIAGNOSIS — Z1211 Encounter for screening for malignant neoplasm of colon: Secondary | ICD-10-CM

## 2018-07-07 ENCOUNTER — Ambulatory Visit
Admission: RE | Admit: 2018-07-07 | Payer: BLUE CROSS/BLUE SHIELD | Source: Ambulatory Visit | Admitting: Gastroenterology

## 2018-07-07 ENCOUNTER — Encounter: Payer: Self-pay | Admitting: Registered Nurse

## 2018-07-07 ENCOUNTER — Encounter: Admission: RE | Payer: Self-pay | Source: Ambulatory Visit

## 2018-07-07 DIAGNOSIS — Z1211 Encounter for screening for malignant neoplasm of colon: Secondary | ICD-10-CM

## 2018-07-07 SURGERY — COLONOSCOPY WITH PROPOFOL
Anesthesia: General

## 2018-11-11 ENCOUNTER — Other Ambulatory Visit: Payer: Self-pay | Admitting: Family Medicine

## 2018-11-11 MED ORDER — OSELTAMIVIR PHOSPHATE 75 MG PO CAPS: 75.0000 mg | ORAL_CAPSULE | Freq: Every day | ORAL | 0 refills | Status: DC

## 2019-04-03 ENCOUNTER — Ambulatory Visit (INDEPENDENT_AMBULATORY_CARE_PROVIDER_SITE_OTHER): Payer: Self-pay | Admitting: Family Medicine

## 2019-04-03 ENCOUNTER — Encounter: Payer: Self-pay | Admitting: Family Medicine

## 2019-04-03 ENCOUNTER — Other Ambulatory Visit: Payer: Self-pay

## 2019-04-03 VITALS — Temp 98.5°F

## 2019-04-03 DIAGNOSIS — F4323 Adjustment disorder with mixed anxiety and depressed mood: Secondary | ICD-10-CM | POA: Insufficient documentation

## 2019-04-03 MED ORDER — HYDROXYZINE HCL 25 MG PO TABS: 25.0000 mg | ORAL_TABLET | Freq: Three times a day (TID) | ORAL | 3 refills | Status: DC | PRN

## 2019-04-03 MED ORDER — SERTRALINE HCL 50 MG PO TABS: ORAL_TABLET | ORAL | 3 refills | Status: DC

## 2019-04-03 NOTE — Progress Notes (Signed)
Temp 98.5 F (36.9 C) (Oral) Comment: pt reported   Subjective:    Patient ID: Fred Tran, male    DOB: Nov 30, 1965, 53 y.o.   MRN: [id number]  HPI: Fred Tran is a 53 y.o. male  Chief Complaint  Patient presents with  . Anxiety   ANXIETY/STRESS- new, was changing jobs in February and with everything that's been going on, has been having a lot more anxiety due to the COVID pandemic Duration:5 months Anxious mood: yes  Excessive worrying: yes Irritability: yes  Sweating: no Nausea: no Palpitations:yes Hyperventilation: no Panic attacks: no Agoraphobia: yes  Obscessions/compulsions: yes Depressed mood: yes [facility] 04/03/2019 03/05/2016  Decreased Interest 2 0  Down, Depressed, Hopeless 2 0  PHQ - 2 Score 4 0  Altered sleeping 1 -  Tired, decreased energy 1 -  Change in appetite 2 -  Feeling bad or failure about yourself  2 -  Trouble concentrating 1 -  Moving slowly or fidgety/restless 0 -  Suicidal thoughts 0 -  PHQ-9 Score 11 -  Difficult doing work/chores Extremely dIfficult -   GAD 7 : Generalized Anxiety Score 04/03/2019  Nervous, Anxious, on Edge 1  Control/stop worrying 3  Worry too much - different things 3  Trouble relaxing 1  Restless 1  Easily annoyed or irritable 1  Afraid - awful might happen 3  Total GAD 7 Score 13  Anxiety Difficulty Extremely difficult   Anhedonia: no Weight changes: no Insomnia: yes hard to fall asleep  Hypersomnia: no Fatigue/loss of energy: yes Feelings of worthlessness: no Feelings of guilt: yes Impaired concentration/indecisiveness: no Suicidal ideations: no  Crying spells: yes Recent Stressors/Life Changes: yes   Relationship problems: no   Family stress: yes     Financial stress: yes    Job stress: yes    Recent death/loss: no  Relevant past medical, surgical, family and social history reviewed and updated as indicated. Interim medical history since our last visit reviewed.  Allergies and medications reviewed and updated.  Review of Systems  Constitutional: Negative.   Respiratory: Negative.   Cardiovascular: Negative.   Gastrointestinal: Negative.   Musculoskeletal: Negative.   Skin: Negative.   Neurological: Negative.   Psychiatric/Behavioral: Positive for dysphoric mood and sleep disturbance. Negative for agitation, behavioral problems, confusion, decreased concentration, hallucinations, self-injury and suicidal ideas. The patient is nervous/anxious. The patient is not hyperactive.     Per HPI unless specifically indicated above     Objective:    Temp 98.5 F (36.9 C) (Oral) Comment: pt reported  Wt Readings from Last 3 Encounters:  06/19/18 286 lb 3 oz (129.8 kg)  11/14/17 289 lb 3.2 oz (131.2 kg)  11/08/16 278 lb (126.1 kg)    Physical Exam Vitals signs and nursing note reviewed.  Constitutional:      General: He is not in acute distress.    Appearance: Normal appearance. He is not ill-appearing, toxic-appearing or diaphoretic.  HENT:     Head: Normocephalic and atraumatic.     Right Ear: External ear normal.     Left Ear: External ear normal.     Nose: Nose normal.     Mouth/Throat:     Mouth: Mucous membranes are moist.     Pharynx: Oropharynx is clear.  Eyes:     General: No scleral icterus.       Right eye: No discharge.        Left eye: No discharge.     Conjunctiva/sclera: Conjunctivae  normal.     Pupils: Pupils are equal, round, and reactive to light.  Neck:     Musculoskeletal: Normal range of motion.  Pulmonary:     Effort: Pulmonary effort is normal. No respiratory distress.     Comments: Speaking in full sentences Musculoskeletal: Normal range of motion.  Skin:    Coloration: Skin is not jaundiced or pale.     Findings: No bruising, erythema, lesion or rash.  Neurological:     Mental Status: He is alert and oriented to person, place, and time. Mental status is at baseline.  Psychiatric:        Mood and Affect: Mood  is anxious and depressed.        Behavior: Behavior normal.        Thought Content: Thought content normal.        Judgment: Judgment normal.     No results found for this or any previous visit.    Assessment & Plan:   Problem List Items Addressed This Visit      Other   Adjustment disorder with mixed anxiety and depressed mood - Primary    Due to the COVID pandemic- will treat with zoloft and hydroxyzine. Await results. Call with any concerns. Continue to monitor. Recheck 1 month          Follow up plan: Return in about 4 weeks (around 05/01/2019) for follow up mood.   . This visit was completed via FaceTime due to the restrictions of the COVID-19 pandemic. All issues as above were discussed and addressed. Physical exam was done as above through visual confirmation on FaceTime. If it was felt that the patient should be evaluated in the office, they were directed there. The patient verbally consented to this visit. . Location of the patient: home . Location of the provider: work . Those involved with this call:  . Provider: Park Liter, DO . CMA: Yvonna Alanis, [place] . Front Desk/Registration: Don Perking  . Time spent on call: 15 minutes with patient face to face via video conference. More than 50% of this time was spent in counseling and coordination of care. 23 minutes total spent in review of patient's record and preparation of their chart.

## 2019-04-03 NOTE — Assessment & Plan Note (Signed)
Due to the COVID pandemic- will treat with zoloft and hydroxyzine. Await results. Call with any concerns. Continue to monitor. Recheck 1 month

## 2019-05-11 ENCOUNTER — Encounter: Payer: Self-pay | Admitting: Family Medicine

## 2019-05-11 ENCOUNTER — Ambulatory Visit (INDEPENDENT_AMBULATORY_CARE_PROVIDER_SITE_OTHER): Payer: Self-pay | Admitting: Family Medicine

## 2019-05-11 ENCOUNTER — Other Ambulatory Visit: Payer: Self-pay

## 2019-05-11 VITALS — Temp 98.6°F | Wt 290.0 lb

## 2019-05-11 DIAGNOSIS — F4323 Adjustment disorder with mixed anxiety and depressed mood: Secondary | ICD-10-CM

## 2019-05-11 MED ORDER — SERTRALINE HCL 100 MG PO TABS: 100.0000 mg | ORAL_TABLET | Freq: Every day | ORAL | 3 refills | Status: DC

## 2019-05-11 NOTE — Assessment & Plan Note (Signed)
Not doing great. Less tearful today. Will increase his medicine and recheck 2-4 weeks. Call with any concerns.

## 2019-05-11 NOTE — Progress Notes (Signed)
Temp 98.6 F (37 C) (Oral)   Wt 290 lb (131.5 kg)   BMI 40.33 kg/m    Subjective:    Patient ID: Fred Tran, male    DOB: 10-25-1965, 53 y.o.   MRN: [id number]  HPI: Fred Tran is a 53 y.o. male  Chief Complaint  Patient presents with  . Anxiety  . Depression   ANXIETY/DEPRESSION Duration:uncontrolled Anxious mood: yes  Excessive worrying: yes Irritability: no  Sweating: no Nausea: no Palpitations:no Hyperventilation: no Panic attacks: no Agoraphobia: yes  Obscessions/compulsions: no Depressed mood: no Depression screen [facility] 2/9 05/11/2019 04/03/2019 03/05/2016  Decreased Interest 1 2 0  Down, Depressed, Hopeless 2 2 0  PHQ - 2 Score 3 4 0  Altered sleeping 1 1 -  Tired, decreased energy 1 1 -  Change in appetite 2 2 -  Feeling bad or failure about yourself  1 2 -  Trouble concentrating 1 1 -  Moving slowly or fidgety/restless 0 0 -  Suicidal thoughts 0 0 -  PHQ-9 Score 9 11 -  Difficult doing work/chores Extremely dIfficult Extremely dIfficult -   GAD 7 : Generalized Anxiety Score 05/11/2019 04/03/2019  Nervous, Anxious, on Edge 3 1  Control/stop worrying 0 3  Worry too much - different things 3 3  Trouble relaxing 1 1  Restless 2 1  Easily annoyed or irritable 1 1  Afraid - awful might happen 3 3  Total GAD 7 Score 13 13  Anxiety Difficulty Somewhat difficult Extremely difficult   Anhedonia: no Weight changes: no Insomnia: no   Hypersomnia: no Fatigue/loss of energy: yes Feelings of worthlessness: no Feelings of guilt: no Impaired concentration/indecisiveness: no Suicidal ideations: no  Crying spells: no Recent Stressors/Life Changes: yes   Relationship problems: no   Family stress: yes     Financial stress: no    Job stress: yes    Recent death/loss: no   Relevant past medical, surgical, family and social history reviewed and updated as indicated. Interim medical history since our last visit reviewed. Allergies and medications  reviewed and updated.  Review of Systems  Constitutional: Negative.   HENT: Negative.   Respiratory: Negative.   Cardiovascular: Negative.   Skin: Negative.   Psychiatric/Behavioral: Positive for dysphoric mood. Negative for agitation, behavioral problems, confusion, decreased concentration, hallucinations, self-injury, sleep disturbance and suicidal ideas. The patient is nervous/anxious. The patient is not hyperactive.     Per HPI unless specifically indicated above     Objective:    Temp 98.6 F (37 C) (Oral)   Wt 290 lb (131.5 kg)   BMI 40.33 kg/m   Wt Readings from Last 3 Encounters:  05/11/19 290 lb (131.5 kg)  06/19/18 286 lb 3 oz (129.8 kg)  11/14/17 289 lb 3.2 oz (131.2 kg)    Physical Exam Vitals signs and nursing note reviewed.  Constitutional:      General: He is not in acute distress.    Appearance: Normal appearance. He is not ill-appearing, toxic-appearing or diaphoretic.  HENT:     Head: Normocephalic and atraumatic.     Right Ear: External ear normal.     Left Ear: External ear normal.     Nose: Nose normal.     Mouth/Throat:     Mouth: Mucous membranes are moist.     Pharynx: Oropharynx is clear.  Eyes:     General: No scleral icterus.       Right eye: No discharge.  Left eye: No discharge.     Conjunctiva/sclera: Conjunctivae normal.     Pupils: Pupils are equal, round, and reactive to light.  Neck:     Musculoskeletal: Normal range of motion.  Pulmonary:     Effort: Pulmonary effort is normal. No respiratory distress.     Comments: Speaking in full sentences Musculoskeletal: Normal range of motion.  Skin:    Coloration: Skin is not jaundiced or pale.     Findings: No bruising, erythema, lesion or rash.  Neurological:     Mental Status: He is alert and oriented to person, place, and time. Mental status is at baseline.  Psychiatric:        Mood and Affect: Mood normal.        Behavior: Behavior normal.        Thought Content: Thought  content normal.        Judgment: Judgment normal.     No results found for this or any previous visit.    Assessment & Plan:   Problem List Items Addressed This Visit      Other   Adjustment disorder with mixed anxiety and depressed mood - Primary    Not doing great. Less tearful today. Will increase his medicine and recheck 2-4 weeks. Call with any concerns.           Follow up plan: Return 2-4 weeks follow up mood.   . This visit was completed via FaceTime due to the restrictions of the COVID-19 pandemic. All issues as above were discussed and addressed. Physical exam was done as above through visual confirmation on FaceTime. If it was felt that the patient should be evaluated in the office, they were directed there. The patient verbally consented to this visit. . Location of the patient: home . Location of the provider: work . Those involved with this call:  . Provider: Park Liter, DO . CMA: Lesle Chris, [place] . Front Desk/Registration: Don Perking  . Time spent on call: 15 minutes with patient face to face via video conference. More than 50% of this time was spent in counseling and coordination of care. 23 minutes total spent in review of patient's record and preparation of their chart.

## 2019-09-02 DIAGNOSIS — Z20828 Contact with and (suspected) exposure to other viral communicable diseases: Secondary | ICD-10-CM | POA: Diagnosis not present

## 2019-09-26 DIAGNOSIS — Z20828 Contact with and (suspected) exposure to other viral communicable diseases: Secondary | ICD-10-CM | POA: Diagnosis not present

## 2019-09-29 ENCOUNTER — Telehealth: Payer: Self-pay | Admitting: Family Medicine

## 2019-09-29 ENCOUNTER — Ambulatory Visit: Payer: BC Managed Care – PPO | Attending: Internal Medicine

## 2019-09-29 ENCOUNTER — Other Ambulatory Visit: Payer: Self-pay

## 2019-09-29 DIAGNOSIS — Z20822 Contact with and (suspected) exposure to covid-19: Secondary | ICD-10-CM

## 2019-09-29 NOTE — Telephone Encounter (Signed)
Pt stated that he does not want to schedule an appt at the time.    Copied from CRM [id number]. Topic: General - Other >> Sep 29, 2019 10:18 AM Herby Abraham C wrote: Reason for CRM: pt called in to update provider that he tested positive for covid. Pt isn't currently having any symptoms

## 2019-09-29 NOTE — Telephone Encounter (Signed)
Noted  

## 2019-09-30 LAB — NOVEL CORONAVIRUS, NAA: SARS-CoV-2, NAA: NOT DETECTED

## 2019-10-01 ENCOUNTER — Ambulatory Visit: Payer: BC Managed Care – PPO | Attending: Internal Medicine

## 2019-10-01 DIAGNOSIS — Z20822 Contact with and (suspected) exposure to covid-19: Secondary | ICD-10-CM | POA: Insufficient documentation

## 2019-10-02 LAB — NOVEL CORONAVIRUS, NAA: SARS-CoV-2, NAA: NOT DETECTED

## 2019-10-15 ENCOUNTER — Other Ambulatory Visit: Payer: Self-pay

## 2019-10-17 DIAGNOSIS — Z20828 Contact with and (suspected) exposure to other viral communicable diseases: Secondary | ICD-10-CM | POA: Diagnosis not present

## 2020-01-28 ENCOUNTER — Other Ambulatory Visit: Payer: Self-pay

## 2020-01-28 ENCOUNTER — Emergency Department: Payer: BC Managed Care – PPO

## 2020-01-28 ENCOUNTER — Emergency Department
Admission: EM | Admit: 2020-01-28 | Discharge: 2020-01-28 | Disposition: A | Discharge status: Home / Self Care | Payer: BC Managed Care – PPO | Attending: Emergency Medicine | Admitting: Emergency Medicine

## 2020-01-28 ENCOUNTER — Encounter: Payer: Self-pay | Admitting: Emergency Medicine

## 2020-01-28 DIAGNOSIS — Y9389 Activity, other specified: Secondary | ICD-10-CM | POA: Insufficient documentation

## 2020-01-28 DIAGNOSIS — S99921A Unspecified injury of right foot, initial encounter: Secondary | ICD-10-CM | POA: Diagnosis present

## 2020-01-28 DIAGNOSIS — S93601A Unspecified sprain of right foot, initial encounter: Secondary | ICD-10-CM

## 2020-01-28 DIAGNOSIS — F17210 Nicotine dependence, cigarettes, uncomplicated: Secondary | ICD-10-CM | POA: Diagnosis not present

## 2020-01-28 DIAGNOSIS — M7731 Calcaneal spur, right foot: Secondary | ICD-10-CM | POA: Insufficient documentation

## 2020-01-28 DIAGNOSIS — Y929 Unspecified place or not applicable: Secondary | ICD-10-CM | POA: Diagnosis not present

## 2020-01-28 DIAGNOSIS — X501XXA Overexertion from prolonged static or awkward postures, initial encounter: Secondary | ICD-10-CM | POA: Insufficient documentation

## 2020-01-28 DIAGNOSIS — Z79899 Other long term (current) drug therapy: Secondary | ICD-10-CM | POA: Diagnosis not present

## 2020-01-28 DIAGNOSIS — Y999 Unspecified external cause status: Secondary | ICD-10-CM | POA: Insufficient documentation

## 2020-01-28 MED ORDER — IBUPROFEN 600 MG PO TABS
600.0000 mg | ORAL_TABLET | Freq: Once | ORAL | Status: AC
  Administered 2020-01-28: 600 mg via ORAL
  Filled 2020-01-28: qty 1

## 2020-01-28 MED ORDER — IBUPROFEN 600 MG PO TABS: 600.0000 mg | ORAL_TABLET | Freq: Three times a day (TID) | ORAL | 0 refills | Status: DC | PRN

## 2020-01-28 MED ORDER — TRAMADOL HCL 50 MG PO TABS: 50.0000 mg | ORAL_TABLET | Freq: Four times a day (QID) | ORAL | 0 refills | Status: DC | PRN

## 2020-01-28 MED ORDER — OXYCODONE-ACETAMINOPHEN 5-325 MG PO TABS
1.0000 | ORAL_TABLET | Freq: Once | ORAL | Status: AC
  Administered 2020-01-28: 1 via ORAL
  Filled 2020-01-28: qty 1

## 2020-01-28 NOTE — Discharge Instructions (Signed)
Follow discharge care instruction take medication as directed.  Be advised medication will cause drowsiness.  Ambulate with support for 3 to 5 days.

## 2020-01-28 NOTE — ED Triage Notes (Signed)
Pt to triage with lmping gait, mask in place; Pt reports" turning his right foot" over coming down the steps

## 2020-01-28 NOTE — ED Provider Notes (Signed)
[facility] Emergency Department Provider Note   ____________________________________________   First MD Initiated Contact with Patient 01/28/20 0730     (approximate)  I have reviewed the triage vital signs and the nursing notes.   HISTORY  Chief Complaint Foot Pain    HPI Fred Tran is a 54 y.o. male patient complain of right foot pain secondary to a twisting incident coming downstairs.  Patient denies loss sensation but decreased range of motion with dorsal and plantar flexion.  Patient rates pain 7/10.  Patient described pain as "achy".  States pain increased with weightbearing.  No palliative measure for complaint.         Past Medical History:  Diagnosis Date  . Hyperlipidemia   . Obesity (BMI 35.0-39.9 without comorbidity)   . Tobacco abuse     Patient Active Problem List   Diagnosis Date Noted  . Adjustment disorder with mixed anxiety and depressed mood 04/03/2019  . Obesity (BMI 35.0-39.9 without comorbidity)   . Tobacco abuse   . Hyperlipidemia     Past Surgical History:  Procedure Laterality Date  . FINGER SURGERY Left    ring finger  . KNEE SURGERY Right     Prior to Admission medications   Medication Sig Start Date End Date Taking? Authorizing Provider  hydrOXYzine (ATARAX/VISTARIL) 25 MG tablet Take 1 tablet (25 mg total) by mouth 3 (three) times daily as needed. 04/03/19   Johnson, Megan P, DO  ibuprofen (ADVIL) 600 MG tablet Take 1 tablet (600 mg total) by mouth every 8 (eight) hours as needed. 01/28/20   Joni Reining, PA-C  sertraline (ZOLOFT) 100 MG tablet Take 1 tablet (100 mg total) by mouth daily. Take 1/2 tab for 1 week, then increase 1 tab daily 05/11/19   Johnson, Megan P, DO  traMADol (ULTRAM) 50 MG tablet Take 1 tablet (50 mg total) by mouth every 6 (six) hours as needed. 01/28/20 01/27/21  Joni Reining, PA-C    Allergies Patient has no known allergies.  Family History  Problem Relation Age of Onset   . Arthritis Mother   . Hyperlipidemia Mother   . Hypertension Mother   . Mental illness Mother   . Thyroid disease Mother   . Alcohol abuse Father   . Drug abuse Father     Social History Social History   Tobacco Use  . Smoking status: Current Every Day Smoker    Packs/day: 1.50    Years: 35.00    Pack years: 52.50    Types: Cigarettes  . Smokeless tobacco: Never Used  Substance Use Topics  . Alcohol use: No  . Drug use: No    Review of Systems Constitutional: No fever/chills Eyes: No visual changes. ENT: No sore throat. Cardiovascular: Denies chest pain. Respiratory: Denies shortness of breath. Gastrointestinal: No abdominal pain.  No nausea, no vomiting.  No diarrhea.  No constipation. Genitourinary: Negative for dysuria. Musculoskeletal: Right foot pain  skin: Negative for rash. Neurological: Negative for headaches, focal weakness or numbness. Endocrine: Hyperlipidemia  Hematological/Lymphatic:  Allergic/Immunilogical: **}  ____________________________________________   PHYSICAL EXAM:  VITAL SIGNS: ED Triage Vitals  Enc Vitals Group     BP 01/28/20 0558 138/70     Pulse Rate 01/28/20 0558 80     Resp 01/28/20 0558 18     Temp 01/28/20 0558 98 F (36.7 C)     Temp Source 01/28/20 0558 Oral     SpO2 01/28/20 0558 100 %  Weight 01/28/20 0552 275 lb (124.7 kg)     Height 01/28/20 0552 6' (1.829 m)     Head Circumference --      Peak Flow --      Pain Score 01/28/20 0552 7     Pain Loc --      Pain Edu? --      Excl. in Industry? --     Constitutional: Alert and oriented. Well appearing and in no acute distress. Eyes: Conjunctivae are normal. PERRL. EOMI. Cardiovascular: Normal rate, regular rhythm. Grossly normal heart sounds.  Good peripheral circulation.  Elevated blood pressure Respiratory: Normal respiratory effort.  No retractions. Lungs CTAB. Musculoskeletal: No obvious deformity to the right foot.  Patient has moderate guarding palpation dorsal  aspect of the foot.  Patient has decreased range of motion with lateral movements and dorsal flexion.   Neurologic:  Normal speech and language. No gross focal neurologic deficits are appreciated. No gait instability. Skin:  Skin is warm, dry and intact. No rash noted. Psychiatric: Mood and affect are normal. Speech and behavior are normal.  ____________________________________________   LABS (all labs ordered are listed, but only abnormal results are displayed)  Labs Reviewed - No data to display ____________________________________________  EKG   ____________________________________________  RADIOLOGY  ED MD interpretation:    Official radiology report(s): DG Foot Complete Right  Result Date: 01/28/2020 CLINICAL DATA:  Foot injury and pain. EXAM: RIGHT FOOT COMPLETE - 3+ VIEW COMPARISON:  None. FINDINGS: There is no evidence of fracture or dislocation. Incidental plantar heel spur. Sub fibular ossicle and mild spurring about the ankle. IMPRESSION: No acute fracture. Electronically Signed   By: Monte Fantasia M.D.   On: 01/28/2020 06:27    ____________________________________________   PROCEDURES  Procedure(s) performed (including Critical Care):  Procedures   ____________________________________________   INITIAL IMPRESSION / ASSESSMENT AND PLAN / ED COURSE  As part of my medical decision making, I reviewed the following data within the [place]     Right foot pain secondary to sprain.  Discussed neck x-ray findings with patient.  Patient given discharge care instructions.  Patient placed in a splint and given crutches to assist ambulation.  Advised to draw infection medication.  Patient advised follow-up orthopedic if no improvement in a week.    Fred Tran was evaluated in Emergency Department on 01/28/2020 for the symptoms described in the history of present illness. He was evaluated in the context of the global COVID-19 pandemic, which  necessitated consideration that the patient might be at risk for infection with the SARS-CoV-2 virus that causes COVID-19. Institutional protocols and algorithms that pertain to the evaluation of patients at risk for COVID-19 are in a state of rapid change based on information released by regulatory bodies including the CDC and federal and state organizations. These policies and algorithms were followed during the patient's care in the ED.       ____________________________________________   FINAL CLINICAL IMPRESSION(S) / ED DIAGNOSES  Final diagnoses:  Sprain of right foot, initial encounter  Heel spur, right     ED Discharge Orders         Ordered    traMADol (ULTRAM) 50 MG tablet  Every 6 hours PRN     01/28/20 0738    ibuprofen (ADVIL) 600 MG tablet  Every 8 hours PRN     01/28/20 0738           Note:  This document was prepared using Dragon voice recognition  software and may include unintentional dictation errors.    Joni Reining, PA-C 01/28/20 0745    Shaune Pollack, MD 01/28/20 2038

## 2020-01-28 NOTE — ED Notes (Signed)
Se triage note   States he turned his right foot while going down steps  Min swelling ambulates with a slim limp  Good pulses

## 2020-08-02 ENCOUNTER — Ambulatory Visit (INDEPENDENT_AMBULATORY_CARE_PROVIDER_SITE_OTHER): Payer: BC Managed Care – PPO | Admitting: Family Medicine

## 2020-08-02 ENCOUNTER — Other Ambulatory Visit: Payer: Self-pay

## 2020-08-02 ENCOUNTER — Encounter: Payer: Self-pay | Admitting: Family Medicine

## 2020-08-02 VITALS — BP 135/79 | HR 67 | Temp 97.8°F | Wt 271.4 lb

## 2020-08-02 DIAGNOSIS — M4726 Other spondylosis with radiculopathy, lumbar region: Secondary | ICD-10-CM | POA: Diagnosis not present

## 2020-08-02 MED ORDER — PREDNISONE 10 MG PO TABS: ORAL_TABLET | ORAL | 0 refills | Status: DC

## 2020-08-02 MED ORDER — BACLOFEN 10 MG PO TABS: 10.0000 mg | ORAL_TABLET | Freq: Every day | ORAL | 0 refills | Status: DC

## 2020-08-02 NOTE — Progress Notes (Signed)
BP 135/79   Pulse 67   Temp 97.8 F (36.6 C)   Wt 271 lb 6.4 oz (123.1 kg)   SpO2 98%   BMI 36.81 kg/m    Subjective:    Patient ID: Fred Tran, male    DOB: 1965-10-29, 54 y.o.   MRN: [id number]  HPI: Fred Tran is a 54 y.o. male  Chief Complaint  Patient presents with  . Hip Pain    pt states he is having left hip pain, pt states pain is every day all day. Patient states pain radiates to his knee and back    BACK PAIN Duration: 3 months ago Mechanism of injury: standing on concrete Location: Left and low back Onset: gradual Severity: severe Quality: sharp and aching pain Frequency: intermittent Radiation: into L hip, down L leg to the knee Aggravating factors: walking, working Alleviating factors: sitting, laying down Status: worse Treatments attempted: ice, heat and ibuprofen  Relief with NSAIDs?: mild Nighttime pain:  no Paresthesias / decreased sensation:  no Bowel / bladder incontinence:  no Fevers:  no Dysuria / urinary frequency:  no  Relevant past medical, surgical, family and social history reviewed and updated as indicated. Interim medical history since our last visit reviewed. Allergies and medications reviewed and updated.  Review of Systems  Constitutional: Negative.   Respiratory: Negative.   Cardiovascular: Negative.   Gastrointestinal: Negative.   Musculoskeletal: Positive for arthralgias, back pain and myalgias. Negative for gait problem, joint swelling, neck pain and neck stiffness.  Skin: Negative.   Neurological: Negative.   Psychiatric/Behavioral: Negative.     Per HPI unless specifically indicated above     Objective:    BP 135/79   Pulse 67   Temp 97.8 F (36.6 C)   Wt 271 lb 6.4 oz (123.1 kg)   SpO2 98%   BMI 36.81 kg/m   Wt Readings from Last 3 Encounters:  08/02/20 271 lb 6.4 oz (123.1 kg)  01/28/20 275 lb (124.7 kg)  05/11/19 290 lb (131.5 kg)    Physical Exam Vitals and nursing note reviewed.   Constitutional:      General: He is not in acute distress.    Appearance: Normal appearance. He is not ill-appearing, toxic-appearing or diaphoretic.  HENT:     Head: Normocephalic and atraumatic.     Right Ear: External ear normal.     Left Ear: External ear normal.     Nose: Nose normal.     Mouth/Throat:     Mouth: Mucous membranes are moist.     Pharynx: Oropharynx is clear.  Eyes:     General: No scleral icterus.       Right eye: No discharge.        Left eye: No discharge.     Extraocular Movements: Extraocular movements intact.     Conjunctiva/sclera: Conjunctivae normal.     Pupils: Pupils are equal, round, and reactive to light.  Cardiovascular:     Rate and Rhythm: Normal rate and regular rhythm.     Pulses: Normal pulses.     Heart sounds: Normal heart sounds. No murmur heard.  No friction rub. No gallop.   Pulmonary:     Effort: Pulmonary effort is normal. No respiratory distress.     Breath sounds: Normal breath sounds. No stridor. No wheezing, rhonchi or rales.  Chest:     Chest wall: No tenderness.  Musculoskeletal:        General: Normal range of motion.  Cervical back: Normal range of motion and neck supple.  Skin:    General: Skin is warm and dry.     Capillary Refill: Capillary refill takes less than 2 seconds.     Coloration: Skin is not jaundiced or pale.     Findings: No bruising, erythema, lesion or rash.  Neurological:     General: No focal deficit present.     Mental Status: He is alert and oriented to person, place, and time. Mental status is at baseline.  Psychiatric:        Mood and Affect: Mood normal.        Behavior: Behavior normal.        Thought Content: Thought content normal.        Judgment: Judgment normal.     Results for orders placed or performed in visit on 10/01/19  Novel Coronavirus, NAA ([facility])   Specimen: Nasopharyngeal(NP) swabs in vial transport medium   NASOPHARYNGE  TESTING  Result Value Ref Range    SARS-CoV-2, NAA Not Detected Not Detected      Assessment & Plan:   Problem List Items Addressed This Visit    None    Visit Diagnoses    Osteoarthritis of spine with radiculopathy, lumbar region    -  Primary   Acting up again. Will treat with baclofen and prednisone and stretches. Call with any concerns. If not improving consider PT vs MRI.    Relevant Medications   predniSONE (DELTASONE) 10 MG tablet   baclofen (LIORESAL) 10 MG tablet       Follow up plan: Return 3-4 weeks.

## 2020-08-02 NOTE — Patient Instructions (Signed)
Sciatica Rehab Ask your health care provider which exercises are safe for you. Do exercises exactly as told by your health care provider and adjust them as directed. It is normal to feel mild stretching, pulling, tightness, or discomfort as you do these exercises. Stop right away if you feel sudden pain or your pain gets worse. Do not begin these exercises until told by your health care provider. Stretching and range-of-motion exercises These exercises warm up your muscles and joints and improve the movement and flexibility of your hips and back. These exercises also help to relieve pain, numbness, and tingling. Sciatic nerve glide 1. Sit in a chair with your head facing down toward your chest. Place your hands behind your back. Let your shoulders slump forward. 2. Slowly straighten one of your legs while you tilt your head back as if you are looking toward the ceiling. Only straighten your leg as far as you can without making your symptoms worse. 3. Hold this position for __________ seconds. 4. Slowly return to the starting position. 5. Repeat with your other leg. Repeat __________ times. Complete this exercise __________ times a day. Knee to chest with hip adduction and internal rotation  1. Lie on your back on a firm surface with both legs straight. 2. Bend one of your knees and move it up toward your chest until you feel a gentle stretch in your lower back and buttock. Then, move your knee toward the shoulder that is on the opposite side from your leg. This is hip adduction and internal rotation. ? Hold your leg in this position by holding on to the front of your knee. 3. Hold this position for __________ seconds. 4. Slowly return to the starting position. 5. Repeat with your other leg. Repeat __________ times. Complete this exercise __________ times a day. Prone extension on elbows  1. Lie on your abdomen on a firm surface. A bed may be too soft for this exercise. 2. Prop yourself up on  your elbows. 3. Use your arms to help lift your chest up until you feel a gentle stretch in your abdomen and your lower back. ? This will place some of your body weight on your elbows. If this is uncomfortable, try stacking pillows under your chest. ? Your hips should stay down, against the surface that you are lying on. Keep your hip and back muscles relaxed. 4. Hold this position for __________ seconds. 5. Slowly relax your upper body and return to the starting position. Repeat __________ times. Complete this exercise __________ times a day. Strengthening exercises These exercises build strength and endurance in your back. Endurance is the ability to use your muscles for a long time, even after they get tired. Pelvic tilt This exercise strengthens the muscles that lie deep in the abdomen. 1. Lie on your back on a firm surface. Bend your knees and keep your feet flat on the floor. 2. Tense your abdominal muscles. Tip your pelvis up toward the ceiling and flatten your lower back into the floor. ? To help with this exercise, you may place a small towel under your lower back and try to push your back into the towel. 3. Hold this position for __________ seconds. 4. Let your muscles relax completely before you repeat this exercise. Repeat __________ times. Complete this exercise __________ times a day. Alternating arm and leg raises  1. Get on your hands and knees on a firm surface. If you are on a hard floor, you may want to use   padding, such as an exercise mat, to cushion your knees. 2. Line up your arms and legs. Your hands should be directly below your shoulders, and your knees should be directly below your hips. 3. Lift your left leg behind you. At the same time, raise your right arm and straighten it in front of you. ? Do not lift your leg higher than your hip. ? Do not lift your arm higher than your shoulder. ? Keep your abdominal and back muscles tight. ? Keep your hips facing the  ground. ? Do not arch your back. ? Keep your balance carefully, and do not hold your breath. 4. Hold this position for __________ seconds. 5. Slowly return to the starting position. 6. Repeat with your right leg and your left arm. Repeat __________ times. Complete this exercise __________ times a day. Posture and body mechanics Good posture and healthy body mechanics can help to relieve stress in your body's tissues and joints. Body mechanics refers to the movements and positions of your body while you do your daily activities. Posture is part of body mechanics. Good posture means:  Your spine is in its natural S-curve position (neutral).  Your shoulders are pulled back slightly.  Your head is not tipped forward. Follow these guidelines to improve your posture and body mechanics in your everyday activities. Standing   When standing, keep your spine neutral and your feet about hip width apart. Keep a slight bend in your knees. Your ears, shoulders, and hips should line up.  When you do a task in which you stand in one place for a long time, place one foot up on a stable object that is 2-4 inches (5-10 cm) high, such as a footstool. This helps keep your spine neutral. Sitting   When sitting, keep your spine neutral and keep your feet flat on the floor. Use a footrest, if necessary, and keep your thighs parallel to the floor. Avoid rounding your shoulders, and avoid tilting your head forward.  When working at a desk or a computer, keep your desk at a height where your hands are slightly lower than your elbows. Slide your chair under your desk so you are close enough to maintain good posture.  When working at a computer, place your monitor at a height where you are looking straight ahead and you do not have to tilt your head forward or downward to look at the screen. Resting  When lying down and resting, avoid positions that are most painful for you.  If you have pain with activities  such as sitting, bending, stooping, or squatting, lie in a position in which your body does not bend very much. For example, avoid curling up on your side with your arms and knees near your chest (fetal position).  If you have pain with activities such as standing for a long time or reaching with your arms, lie with your spine in a neutral position and bend your knees slightly. Try the following positions: ? Lying on your side with a pillow between your knees. ? Lying on your back with a pillow under your knees. Lifting   When lifting objects, keep your feet at least shoulder width apart and tighten your abdominal muscles.  Bend your knees and hips and keep your spine neutral. It is important to lift using the strength of your legs, not your back. Do not lock your knees straight out.  Always ask for help to lift heavy or awkward objects. This information is not   intended to replace advice given to you by your health care provider. Make sure you discuss any questions you have with your health care provider. Document Revised: 12/19/2018 Document Reviewed: 09/18/2018 Elsevier Patient Education  2020 [employer]  

## 2020-09-01 ENCOUNTER — Other Ambulatory Visit: Payer: Self-pay | Admitting: Family Medicine

## 2020-09-01 NOTE — Telephone Encounter (Signed)
Requested medication (s) are due for refill today: yes   Requested medication (s) are on the active medication list:yes   Last refill:  08/02/2020  Future visit scheduled: no  Notes to clinic:  this refill cannot be delegated    Requested Prescriptions  Pending Prescriptions Disp Refills   baclofen (LIORESAL) 10 MG tablet [Pharmacy Med Name: BACLOFEN 10MG  TABLETS] 30 tablet     Sig: TAKE 1 TABLET(10 MG) BY MOUTH AT BEDTIME      Not Delegated - Analgesics:  Muscle Relaxants Failed - 09/01/2020 11:25 AM      Failed - This refill cannot be delegated      Passed - Valid encounter within last 6 months    Recent Outpatient Visits           1 month ago Osteoarthritis of spine with radiculopathy, lumbar region   [facility], Megan P, DO   1 year ago Adjustment disorder with mixed anxiety and depressed mood   [facility], Megan P, DO   1 year ago Adjustment disorder with mixed anxiety and depressed mood   [facility] [place], [place], DO   2 years ago Right hip pain   [facility] [place], Megan P, DO   2 years ago Acute ethmoidal sinusitis, recurrence not specified   [facility] [place], [place]

## 2020-09-01 NOTE — Telephone Encounter (Signed)
Can this be refilled? 

## 2020-10-19 ENCOUNTER — Encounter: Payer: Self-pay | Admitting: Family Medicine

## 2020-10-26 ENCOUNTER — Ambulatory Visit: Payer: BC Managed Care – PPO | Admitting: Family Medicine

## 2020-10-26 ENCOUNTER — Other Ambulatory Visit: Payer: Self-pay

## 2020-10-26 ENCOUNTER — Encounter: Payer: Self-pay | Admitting: Family Medicine

## 2020-10-26 DIAGNOSIS — M5136 Other intervertebral disc degeneration, lumbar region: Secondary | ICD-10-CM

## 2020-10-26 DIAGNOSIS — M51369 Other intervertebral disc degeneration, lumbar region without mention of lumbar back pain or lower extremity pain: Secondary | ICD-10-CM | POA: Insufficient documentation

## 2020-10-26 MED ORDER — PREDNISONE 10 MG PO TABS: ORAL_TABLET | ORAL | 0 refills | Status: DC

## 2020-10-26 MED ORDER — BACLOFEN 10 MG PO TABS: ORAL_TABLET | ORAL | 1 refills | Status: DC

## 2020-10-26 NOTE — Progress Notes (Signed)
BP 138/73   Pulse 69   Temp 97.8 F (36.6 C) (Oral)   Wt 274 lb 6.4 oz (124.5 kg)   SpO2 98%   BMI 37.22 kg/m    Subjective:    Patient ID: Fred Tran, male    DOB: 1966/07/30, 55 y.o.   MRN: [id number]  HPI: Fred Tran is a 55 y.o. male  Chief Complaint  Patient presents with  . Arthritis    Pt states he his pain is the same and has not gotten better, but it has worsen since it first started years ago. Pt states his leg pain is where it seems to burn when he touches or rubs his leg. Pt states the leg pain is a come and go type of pain, the pain varies from sharp, stabbing and burning pains.    BACK PAIN Duration: chronic, worse in the past couple of weeks Mechanism of injury: lifting Location: Left and low back Onset: gradual Severity: severe Quality: aching and sore Frequency: intermittent Radiation: none, buttocks and L leg below the knee Aggravating factors: work, movement, going up and down stairs Alleviating factors: sitting, laying down Status: worse Treatments attempted: rest, ice, heat, APAP, ibuprofen, aleve and HEP  Relief with NSAIDs?: mild Nighttime pain:  no Paresthesias / decreased sensation:  no Bowel / bladder incontinence:  no Fevers:  no Dysuria / urinary frequency:  no  Relevant past medical, surgical, family and social history reviewed and updated as indicated. Interim medical history since our last visit reviewed. Allergies and medications reviewed and updated.  Review of Systems  Constitutional: Negative.   Respiratory: Negative.   Cardiovascular: Negative.   Gastrointestinal: Negative.   Musculoskeletal: Positive for arthralgias, back pain and myalgias. Negative for gait problem, joint swelling, neck pain and neck stiffness.  Skin: Negative.   Neurological: Positive for weakness and numbness. Negative for dizziness, tremors, seizures, syncope, facial asymmetry, speech difficulty, light-headedness and headaches.   Psychiatric/Behavioral: Negative.     Per HPI unless specifically indicated above     Objective:    BP 138/73   Pulse 69   Temp 97.8 F (36.6 C) (Oral)   Wt 274 lb 6.4 oz (124.5 kg)   SpO2 98%   BMI 37.22 kg/m   Wt Readings from Last 3 Encounters:  10/26/20 274 lb 6.4 oz (124.5 kg)  08/02/20 271 lb 6.4 oz (123.1 kg)  01/28/20 275 lb (124.7 kg)    Physical Exam Vitals and nursing note reviewed.  Constitutional:      General: He is not in acute distress.    Appearance: Normal appearance. He is not ill-appearing, toxic-appearing or diaphoretic.  HENT:     Head: Normocephalic and atraumatic.     Right Ear: External ear normal.     Left Ear: External ear normal.     Nose: Nose normal.     Mouth/Throat:     Mouth: Mucous membranes are moist.     Pharynx: Oropharynx is clear.  Eyes:     General: No scleral icterus.       Right eye: No discharge.        Left eye: No discharge.     Extraocular Movements: Extraocular movements intact.     Conjunctiva/sclera: Conjunctivae normal.     Pupils: Pupils are equal, round, and reactive to light.  Cardiovascular:     Rate and Rhythm: Normal rate and regular rhythm.     Pulses: Normal pulses.     Heart sounds: Normal heart  sounds. No murmur heard. No friction rub. No gallop.   Pulmonary:     Effort: Pulmonary effort is normal. No respiratory distress.     Breath sounds: Normal breath sounds. No stridor. No wheezing, rhonchi or rales.  Chest:     Chest wall: No tenderness.  Musculoskeletal:        General: Normal range of motion.     Cervical back: Normal range of motion and neck supple.  Skin:    General: Skin is warm and dry.     Capillary Refill: Capillary refill takes less than 2 seconds.     Coloration: Skin is not jaundiced or pale.     Findings: No bruising, erythema, lesion or rash.  Neurological:     General: No focal deficit present.     Mental Status: He is alert and oriented to person, place, and time. Mental  status is at baseline.  Psychiatric:        Mood and Affect: Mood normal.        Behavior: Behavior normal.        Thought Content: Thought content normal.        Judgment: Judgment normal.     Results for orders placed or performed in visit on 10/01/19  Novel Coronavirus, NAA ([facility])   Specimen: Nasopharyngeal(NP) swabs in vial transport medium   NASOPHARYNGE  TESTING  Result Value Ref Range   SARS-CoV-2, NAA Not Detected Not Detected      Assessment & Plan:   Problem List Items Addressed This Visit      Musculoskeletal and Integument   Other intervertebral disc degeneration, lumbar region    In acute exacerbation. Will get him set up for MRI and PT and treat with prednisone and baclofen. Call with any concerns. Continue to monitor.       Relevant Medications   baclofen (LIORESAL) 10 MG tablet   predniSONE (DELTASONE) 10 MG tablet   Other Relevant Orders   MR Lumbar Spine Wo Contrast   Ambulatory referral to Physical Therapy       Follow up plan: Return in about 2 weeks (around 11/09/2020) for physical .

## 2020-10-26 NOTE — Assessment & Plan Note (Signed)
In acute exacerbation. Will get him set up for MRI and PT and treat with prednisone and baclofen. Call with any concerns. Continue to monitor.

## 2020-10-26 NOTE — Patient Instructions (Addendum)
[facility] is located in the [place]. [address] [place], [place] [address] [phone] 

## 2020-10-28 DIAGNOSIS — M5136 Other intervertebral disc degeneration, lumbar region: Secondary | ICD-10-CM | POA: Diagnosis not present

## 2020-11-09 ENCOUNTER — Other Ambulatory Visit: Payer: Self-pay

## 2020-11-09 ENCOUNTER — Ambulatory Visit (INDEPENDENT_AMBULATORY_CARE_PROVIDER_SITE_OTHER): Payer: BC Managed Care – PPO | Admitting: Family Medicine

## 2020-11-09 ENCOUNTER — Encounter: Payer: Self-pay | Admitting: Family Medicine

## 2020-11-09 VITALS — BP 137/74 | HR 64 | Temp 97.6°F | Ht 72.0 in | Wt 277.0 lb

## 2020-11-09 DIAGNOSIS — Z0000 Encounter for general adult medical examination without abnormal findings: Secondary | ICD-10-CM

## 2020-11-09 DIAGNOSIS — R030 Elevated blood-pressure reading, without diagnosis of hypertension: Secondary | ICD-10-CM

## 2020-11-09 DIAGNOSIS — Z136 Encounter for screening for cardiovascular disorders: Secondary | ICD-10-CM | POA: Diagnosis not present

## 2020-11-09 DIAGNOSIS — H538 Other visual disturbances: Secondary | ICD-10-CM | POA: Diagnosis not present

## 2020-11-09 DIAGNOSIS — Z1211 Encounter for screening for malignant neoplasm of colon: Secondary | ICD-10-CM | POA: Diagnosis not present

## 2020-11-09 LAB — URINALYSIS, ROUTINE W REFLEX MICROSCOPIC
Bilirubin, UA: NEGATIVE
Glucose, UA: NEGATIVE
Ketones, UA: NEGATIVE
Leukocytes,UA: NEGATIVE
Nitrite, UA: NEGATIVE
Protein,UA: NEGATIVE
RBC, UA: NEGATIVE
Specific Gravity, UA: 1.015 (ref 1.005–1.030)
Urobilinogen, Ur: 0.2 mg/dL (ref 0.2–1.0)
pH, UA: 7 (ref 5.0–7.5)

## 2020-11-09 LAB — MICROALBUMIN, URINE WAIVED
Creatinine, Urine Waived: 100 mg/dL (ref 10–300)
Microalb, Ur Waived: 10 mg/L (ref 0–19)
Microalb/Creat Ratio: <30 mg/g (ref <30)

## 2020-11-09 LAB — BAYER DCA HB A1C WAIVED: HB A1C (BAYER DCA - WAIVED): 5.7 % (ref <7.0)

## 2020-11-09 NOTE — Progress Notes (Signed)
BP 137/74   Pulse 64   Temp 97.6 F (36.4 C)   Ht 6' (1.829 m)   Wt 277 lb (125.6 kg)   SpO2 97%   BMI 37.57 kg/m    Subjective:    Patient ID: Fred Tran, male    DOB: 1966/07/10, 55 y.o.   MRN: [id number]  HPI: Fred Tran is a 55 y.o. male presenting on 11/09/2020 for comprehensive medical examination. Current medical complaints include:  Back has been continuing to hurt. He has started PT but has not gone more than 1x. His MRI was denied.   He currently lives with: wife Interim Problems from his last visit: no  Depression Screen done today and results listed below:  Depression screen [facility] 2/9 11/09/2020 08/02/2020 05/11/2019 04/03/2019 03/05/2016  Decreased Interest 0 0 1 2 0  Down, Depressed, Hopeless 0 0 2 2 0  PHQ - 2 Score 0 0 3 4 0  Altered sleeping - - 1 1 -  Tired, decreased energy - - 1 1 -  Change in appetite - - 2 2 -  Feeling bad or failure about yourself  - - 1 2 -  Trouble concentrating - - 1 1 -  Moving slowly or fidgety/restless - - 0 0 -  Suicidal thoughts - - 0 0 -  PHQ-9 Score - - 9 11 -  Difficult doing work/chores - - Extremely dIfficult Extremely dIfficult -     Past Medical History:  Past Medical History:  Diagnosis Date  . Arthritis   . Hyperlipidemia   . Obesity (BMI 35.0-39.9 without comorbidity)   . Tobacco abuse     Surgical History:  Past Surgical History:  Procedure Laterality Date  . FINGER SURGERY Left    ring finger  . KNEE SURGERY Right     Medications:  Current Outpatient Medications on File Prior to Visit  Medication Sig  . baclofen (LIORESAL) 10 MG tablet TAKE 1 TABLET(10 MG) BY MOUTH AT BEDTIME   No current facility-administered medications on file prior to visit.    Allergies:  No Known Allergies  Social History:  Social History   Socioeconomic History  . Marital status: Married    Spouse name: Not on file  . Number of children: Not on file  . Years of education: Not on file  . Highest  education level: Not on file  Occupational History  . Not on file  Tobacco Use  . Smoking status: Current Every Day Smoker    Packs/day: 1.50    Years: 35.00    Pack years: 52.50    Types: Cigarettes  . Smokeless tobacco: Never Used  Vaping Use  . Vaping Use: Never used  Substance and Sexual Activity  . Alcohol use: No  . Drug use: No  . Sexual activity: Yes  Other Topics Concern  . Not on file  Social History Narrative  . Not on file   Social Determinants of Health   Financial Resource Strain: Not on file  Food Insecurity: Not on file  Transportation Needs: Not on file  Physical Activity: Not on file  Stress: Not on file  Social Connections: Not on file  Intimate Partner Violence: Not on file   Social History   Tobacco Use  Smoking Status Current Every Day Smoker  . Packs/day: 1.50  . Years: 35.00  . Pack years: 52.50  . Types: Cigarettes  Smokeless Tobacco Never Used   Social History   Substance and Sexual Activity  Alcohol Use No    Family History:  Family History  Problem Relation Age of Onset  . Arthritis Mother   . Hyperlipidemia Mother   . Hypertension Mother   . Mental illness Mother   . Thyroid disease Mother   . Alcohol abuse Father   . Drug abuse Father     Past medical history, surgical history, medications, allergies, family history and social history reviewed with patient today and changes made to appropriate areas of the chart.   Review of Systems  Constitutional: Negative.   HENT: Negative.   Eyes: Positive for blurred vision. Negative for double vision, photophobia, pain, discharge and redness.  Respiratory: Positive for cough. Negative for hemoptysis, sputum production, shortness of breath and wheezing.   Cardiovascular: Negative.   Gastrointestinal: Negative.   Genitourinary: Negative.   Musculoskeletal: Negative.   Skin: Negative.   Neurological: Negative.   Endo/Heme/Allergies: Negative.   Psychiatric/Behavioral: Negative.     All other ROS negative except what is listed above and in the HPI.      Objective:    BP 137/74   Pulse 64   Temp 97.6 F (36.4 C)   Ht 6' (1.829 m)   Wt 277 lb (125.6 kg)   SpO2 97%   BMI 37.57 kg/m   Wt Readings from Last 3 Encounters:  11/09/20 277 lb (125.6 kg)  10/26/20 274 lb 6.4 oz (124.5 kg)  08/02/20 271 lb 6.4 oz (123.1 kg)    Physical Exam Vitals and nursing note reviewed.  Constitutional:      General: He is not in acute distress.    Appearance: Normal appearance. He is obese. He is not ill-appearing, toxic-appearing or diaphoretic.  HENT:     Head: Normocephalic and atraumatic.     Right Ear: Tympanic membrane, ear canal and external ear normal. There is no impacted cerumen.     Left Ear: Tympanic membrane, ear canal and external ear normal. There is no impacted cerumen.     Nose: Nose normal. No congestion or rhinorrhea.     Mouth/Throat:     Mouth: Mucous membranes are moist.     Pharynx: Oropharynx is clear. No oropharyngeal exudate or posterior oropharyngeal erythema.  Eyes:     General: No scleral icterus.       Right eye: No discharge.        Left eye: No discharge.     Extraocular Movements: Extraocular movements intact.     Conjunctiva/sclera: Conjunctivae normal.     Pupils: Pupils are equal, round, and reactive to light.  Neck:     Vascular: No carotid bruit.  Cardiovascular:     Rate and Rhythm: Normal rate and regular rhythm.     Pulses: Normal pulses.     Heart sounds: No murmur heard. No friction rub. No gallop.   Pulmonary:     Effort: Pulmonary effort is normal. No respiratory distress.     Breath sounds: Normal breath sounds. No stridor. No wheezing, rhonchi or rales.  Chest:     Chest wall: No tenderness.  Abdominal:     General: Abdomen is flat. Bowel sounds are normal. There is no distension.     Palpations: Abdomen is soft. There is no mass.     Tenderness: There is no abdominal tenderness. There is no right CVA tenderness,  left CVA tenderness, guarding or rebound.     Hernia: No hernia is present.  Genitourinary:    Comments: Genital exam deferred with shared decision making Musculoskeletal:  General: No swelling, tenderness, deformity or signs of injury.     Cervical back: Normal range of motion and neck supple. No rigidity. No muscular tenderness.     Right lower leg: No edema.     Left lower leg: No edema.  Lymphadenopathy:     Cervical: No cervical adenopathy.  Skin:    General: Skin is warm and dry.     Capillary Refill: Capillary refill takes less than 2 seconds.     Coloration: Skin is not jaundiced or pale.     Findings: No bruising, erythema, lesion or rash.  Neurological:     General: No focal deficit present.     Mental Status: He is alert and oriented to person, place, and time.     Cranial Nerves: No cranial nerve deficit.     Sensory: No sensory deficit.     Motor: No weakness.     Coordination: Coordination normal.     Gait: Gait normal.     Deep Tendon Reflexes: Reflexes normal.  Psychiatric:        Mood and Affect: Mood normal.        Behavior: Behavior normal.        Thought Content: Thought content normal.        Judgment: Judgment normal.     Results for orders placed or performed in visit on 11/09/20  Microalbumin, Urine Waived  Result Value Ref Range   Microalb, Ur Waived 10 0 - 19 mg/L   Creatinine, Urine Waived 100 10 - 300 mg/dL   Microalb/Creat Ratio <30 <30 mg/g  Urinalysis, Routine w reflex microscopic  Result Value Ref Range   Specific Gravity, UA 1.015 1.005 - 1.030   pH, UA 7.0 5.0 - 7.5   Color, UA Yellow Yellow   Appearance Ur Clear Clear   Leukocytes,UA Negative Negative   Protein,UA Negative Negative/Trace   Glucose, UA Negative Negative   Ketones, UA Negative Negative   RBC, UA Negative Negative   Bilirubin, UA Negative Negative   Urobilinogen, Ur 0.2 0.2 - 1.0 mg/dL   Nitrite, UA Negative Negative  Bayer DCA Hb A1c Waived  Result Value Ref  Range   HB A1C (BAYER DCA - WAIVED) 5.7 <7.0 %      Assessment & Plan:   Problem List Items Addressed This Visit   None   Visit Diagnoses    Routine general medical examination at a health care facility    -  Primary   Vaccines up to date. Screening labs checked today. Cologuard ordered today. Continue diet and exercise. Call with any concerns. Continue to monitor.    Relevant Orders   Comprehensive metabolic panel   CBC with Differential/Platelet   Lipid Panel w/o Chol/HDL Ratio   PSA   TSH   Urinalysis, Routine w reflex microscopic (Completed)   Hepatitis C Antibody   HIV Antibody (routine testing w rflx)   Elevated blood pressure reading       Better on recheck. Continue to monitor. Call with any concerns.    Relevant Orders   Microalbumin, Urine Waived (Completed)   Blurred vision       Will see his eye doctor. Checking labs today. Await results.    Relevant Orders   Bayer DCA Hb A1c Waived (Completed)   Screening for colon cancer       Cologuard ordered today.    Relevant Orders   Cologuard       Discussed aspirin prophylaxis for myocardial infarction prevention  and decision was it was not indicated  LABORATORY TESTING:  Health maintenance labs ordered today as discussed above.   The natural history of prostate cancer and ongoing controversy regarding screening and potential treatment outcomes of prostate cancer has been discussed with the patient. The meaning of a false positive PSA and a false negative PSA has been discussed. He indicates understanding of the limitations of this screening test and wishes to proceed with screening PSA testing.   IMMUNIZATIONS:   - Tdap: Tetanus vaccination status reviewed: last tetanus booster within 10 years. - Influenza: Up to date - Pneumovax: Up to date - COVID: Up to date  SCREENING: - Colonoscopy: Cologuard ordered today.   Discussed with patient purpose of the colonoscopy is to detect colon cancer at curable  precancerous or early stages   PATIENT COUNSELING:    Sexuality: Discussed sexually transmitted diseases, partner selection, use of condoms, avoidance of unintended pregnancy  and contraceptive alternatives.   Advised to avoid cigarette smoking.  I discussed with the patient that most people either abstain from alcohol or drink within safe limits (<=14/week and <=4 drinks/occasion for males, <=7/weeks and <= 3 drinks/occasion for females) and that the risk for alcohol disorders and other health effects rises proportionally with the number of drinks per week and how often a drinker exceeds daily limits.  Discussed cessation/primary prevention of drug use and availability of treatment for abuse.   Diet: Encouraged to adjust caloric intake to maintain  or achieve ideal body weight, to reduce intake of dietary saturated fat and total fat, to limit sodium intake by avoiding high sodium foods and not adding table salt, and to maintain adequate dietary potassium and calcium preferably from fresh fruits, vegetables, and low-fat dairy products.    stressed the importance of regular exercise  Injury prevention: Discussed safety belts, safety helmets, smoke detector, smoking near bedding or upholstery.   Dental health: Discussed importance of regular tooth brushing, flossing, and dental visits.   Follow up plan: NEXT PREVENTATIVE PHYSICAL DUE IN 1 YEAR. Return in about 6 weeks (around 12/21/2020).

## 2020-11-10 LAB — COMPREHENSIVE METABOLIC PANEL
Albumin: 4.6 g/dL (ref 3.8–4.9)
Alkaline Phosphatase: 86 IU/L (ref 44–121)
BUN: 9 mg/dL (ref 6–24)
Bilirubin Total: <0.2 mg/dL (ref 0.0–1.2)
Calcium: 9.5 mg/dL (ref 8.7–10.2)
Chloride: 104 mmol/L (ref 96–106)
Globulin, Total: 2.5 g/dL (ref 1.5–4.5)
Total Protein: 7.1 g/dL (ref 6.0–8.5)

## 2020-11-10 LAB — LIPID PANEL W/O CHOL/HDL RATIO
Cholesterol, Total: 218 mg/dL — ABNORMAL HIGH (ref 100–199)
Triglycerides: 71 mg/dL (ref 0–149)

## 2020-11-10 LAB — CBC WITH DIFFERENTIAL/PLATELET
Basophils Absolute: 0 10*3/uL (ref 0.0–0.2)
MCHC: 33.9 g/dL (ref 31.5–35.7)
MCV: 94 fL (ref 79–97)
Neutrophils: 55 %

## 2020-11-11 LAB — CBC WITH DIFFERENTIAL/PLATELET
Basos: 1 %
EOS (ABSOLUTE): 0.3 10*3/uL (ref 0.0–0.4)
Eos: 3 %
Hematocrit: 47.8 % (ref 37.5–51.0)
Hemoglobin: 16.2 g/dL (ref 13.0–17.7)
Immature Grans (Abs): 0 10*3/uL (ref 0.0–0.1)
Immature Granulocytes: 0 %
Lymphocytes Absolute: 2.4 10*3/uL (ref 0.7–3.1)
Lymphs: 31 %
MCH: 31.8 pg (ref 26.6–33.0)
Monocytes Absolute: 0.8 10*3/uL (ref 0.1–0.9)
Monocytes: 10 %
Neutrophils Absolute: 4.4 10*3/uL (ref 1.4–7.0)
Platelets: 272 10*3/uL (ref 150–450)
RBC: 5.1 x10E6/uL (ref 4.14–5.80)
RDW: 12.6 % (ref 11.6–15.4)
WBC: 7.9 10*3/uL (ref 3.4–10.8)

## 2020-11-11 LAB — COMPREHENSIVE METABOLIC PANEL
ALT: 25 IU/L (ref 0–44)
AST: 26 IU/L (ref 0–40)
Albumin/Globulin Ratio: 1.8 (ref 1.2–2.2)
BUN/Creatinine Ratio: 12 (ref 9–20)
CO2: 19 mmol/L — ABNORMAL LOW (ref 20–29)
Creatinine, Ser: 0.73 mg/dL — ABNORMAL LOW (ref 0.76–1.27)
Glucose: 98 mg/dL (ref 65–99)
Potassium: 4.5 mmol/L (ref 3.5–5.2)
Sodium: 142 mmol/L (ref 134–144)
eGFR: 107 mL/min/{1.73_m2} (ref >59)

## 2020-11-11 LAB — PSA: Prostate Specific Ag, Serum: 0.7 ng/mL (ref 0.0–4.0)

## 2020-11-11 LAB — TSH: TSH: 2.93 u[IU]/mL (ref 0.450–4.500)

## 2020-11-11 LAB — LIPID PANEL W/O CHOL/HDL RATIO
HDL: 51 mg/dL (ref >39)
LDL Chol Calc (NIH): 154 mg/dL — ABNORMAL HIGH (ref 0–99)
VLDL Cholesterol Cal: 13 mg/dL (ref 5–40)

## 2020-11-11 LAB — HIV ANTIBODY (ROUTINE TESTING W REFLEX): HIV Screen 4th Generation wRfx: NONREACTIVE

## 2020-11-11 LAB — HEPATITIS C ANTIBODY: Hep C Virus Ab: <0.1 s/co ratio (ref 0.0–0.9)

## 2020-11-30 DIAGNOSIS — M5136 Other intervertebral disc degeneration, lumbar region: Secondary | ICD-10-CM | POA: Diagnosis not present

## 2020-12-02 DIAGNOSIS — M5136 Other intervertebral disc degeneration, lumbar region: Secondary | ICD-10-CM | POA: Diagnosis not present

## 2020-12-13 DIAGNOSIS — H5213 Myopia, bilateral: Secondary | ICD-10-CM | POA: Diagnosis not present

## 2020-12-21 DIAGNOSIS — M5116 Intervertebral disc disorders with radiculopathy, lumbar region: Secondary | ICD-10-CM | POA: Diagnosis not present

## 2020-12-21 DIAGNOSIS — M2578 Osteophyte, vertebrae: Secondary | ICD-10-CM | POA: Diagnosis not present

## 2020-12-21 DIAGNOSIS — M47816 Spondylosis without myelopathy or radiculopathy, lumbar region: Secondary | ICD-10-CM | POA: Diagnosis not present

## 2020-12-21 DIAGNOSIS — M438X6 Other specified deforming dorsopathies, lumbar region: Secondary | ICD-10-CM | POA: Diagnosis not present

## 2020-12-21 DIAGNOSIS — M5136 Other intervertebral disc degeneration, lumbar region: Secondary | ICD-10-CM | POA: Diagnosis not present

## 2020-12-21 DIAGNOSIS — M5416 Radiculopathy, lumbar region: Secondary | ICD-10-CM | POA: Diagnosis not present

## 2021-01-02 DIAGNOSIS — M5416 Radiculopathy, lumbar region: Secondary | ICD-10-CM | POA: Diagnosis not present

## 2021-01-02 DIAGNOSIS — M4807 Spinal stenosis, lumbosacral region: Secondary | ICD-10-CM | POA: Diagnosis not present

## 2021-01-02 DIAGNOSIS — M48061 Spinal stenosis, lumbar region without neurogenic claudication: Secondary | ICD-10-CM | POA: Diagnosis not present

## 2021-01-02 DIAGNOSIS — M5116 Intervertebral disc disorders with radiculopathy, lumbar region: Secondary | ICD-10-CM | POA: Diagnosis not present

## 2021-01-03 DIAGNOSIS — M5416 Radiculopathy, lumbar region: Secondary | ICD-10-CM | POA: Diagnosis not present

## 2021-01-12 DIAGNOSIS — M5416 Radiculopathy, lumbar region: Secondary | ICD-10-CM | POA: Diagnosis not present

## 2021-01-12 DIAGNOSIS — M4726 Other spondylosis with radiculopathy, lumbar region: Secondary | ICD-10-CM | POA: Diagnosis not present

## 2021-01-12 DIAGNOSIS — F17210 Nicotine dependence, cigarettes, uncomplicated: Secondary | ICD-10-CM | POA: Diagnosis not present

## 2021-01-12 DIAGNOSIS — F17220 Nicotine dependence, chewing tobacco, uncomplicated: Secondary | ICD-10-CM | POA: Diagnosis not present

## 2021-01-12 DIAGNOSIS — M48061 Spinal stenosis, lumbar region without neurogenic claudication: Secondary | ICD-10-CM | POA: Diagnosis not present

## 2021-01-12 DIAGNOSIS — M5126 Other intervertebral disc displacement, lumbar region: Secondary | ICD-10-CM | POA: Diagnosis not present

## 2021-01-24 DIAGNOSIS — M5116 Intervertebral disc disorders with radiculopathy, lumbar region: Secondary | ICD-10-CM | POA: Diagnosis not present

## 2021-01-24 DIAGNOSIS — F17210 Nicotine dependence, cigarettes, uncomplicated: Secondary | ICD-10-CM | POA: Diagnosis not present

## 2021-01-24 DIAGNOSIS — M5416 Radiculopathy, lumbar region: Secondary | ICD-10-CM | POA: Diagnosis not present

## 2021-02-21 DIAGNOSIS — M5416 Radiculopathy, lumbar region: Secondary | ICD-10-CM | POA: Diagnosis not present

## 2021-02-28 ENCOUNTER — Ambulatory Visit: Payer: BC Managed Care – PPO | Admitting: Family Medicine

## 2021-02-28 ENCOUNTER — Other Ambulatory Visit: Payer: Self-pay

## 2021-02-28 ENCOUNTER — Telehealth: Payer: Self-pay

## 2021-02-28 ENCOUNTER — Encounter: Payer: Self-pay | Admitting: Family Medicine

## 2021-02-28 VITALS — BP 130/74 | HR 74 | Temp 97.9°F | Ht 72.5 in | Wt 279.6 lb

## 2021-02-28 DIAGNOSIS — Z01818 Encounter for other preprocedural examination: Secondary | ICD-10-CM | POA: Diagnosis not present

## 2021-02-28 LAB — COAGUCHEK XS/INR WAIVED
INR: 1 (ref 0.9–1.1)
Prothrombin Time: 11.8 s

## 2021-02-28 NOTE — Telephone Encounter (Signed)
Surgery clearance form in signature folder to be completed.

## 2021-02-28 NOTE — Progress Notes (Signed)
Interpreted by me on 02/28/21. Bradycardic at 59bpm, No ST segment changes.

## 2021-02-28 NOTE — Progress Notes (Signed)
BP 130/74   Pulse 74   Temp 97.9 F (36.6 C)   Ht 6' 0.5" (1.842 m)   Wt 279 lb 9.6 oz (126.8 kg)   SpO2 97%   BMI 37.40 kg/m    Subjective:    Patient ID: Fred Tran, male    DOB: 06-12-1966, 55 y.o.   MRN: [id number]  HPI: Fred Tran is a 55 y.o. male  Chief Complaint  Patient presents with   surgery clearance   Bruno presents today for surgical clearance for L2-3 laminectomy fusion to be done once clearance is received. Michel has had surgery in the past. His last surgery on his knee was a few years ago and he did well with it. He has been under anesthesia before. He has never had any problems with anesthesia. No family history of problems with anesthesia- brother was resistant to anesthesia with a colonoscopy. No family history of problems with anesthesia. No problems with post-op n/v. No family history of malignant hyperthermia. He notes that he always went home when he was supposed to. No problems with extubation. He notes that besides his back really hurting, he has been feeling well. He has no CP, No SOB. Able to walk up 3 flights of stairs without SOB- but can't because of his back. No other concerns or complaints at this time.   Active Ambulatory Problems    Diagnosis Date Noted   Obesity (BMI 35.0-39.9 without comorbidity)    Tobacco abuse    Hyperlipidemia    Adjustment disorder with mixed anxiety and depressed mood 04/03/2019   Other intervertebral disc degeneration, lumbar region 10/26/2020   Resolved Ambulatory Problems    Diagnosis Date Noted   No Resolved Ambulatory Problems   Past Medical History:  Diagnosis Date   Arthritis    Past Surgical History:  Procedure Laterality Date   FINGER SURGERY Left    ring finger   KNEE SURGERY Right    Outpatient Encounter Medications as of 02/28/2021  Medication Sig   baclofen (LIORESAL) 10 MG tablet TAKE 1 TABLET(10 MG) BY MOUTH AT BEDTIME   No facility-administered encounter medications on file as  of 02/28/2021.   No Known Allergies Social History   Socioeconomic History   Marital status: Married    Spouse name: Not on file   Number of children: Not on file   Years of education: Not on file   Highest education level: Not on file  Occupational History   Not on file  Tobacco Use   Smoking status: Every Day    Packs/day: 1.50    Years: 35.00    Pack years: 52.50    Types: Cigarettes   Smokeless tobacco: Never  Vaping Use   Vaping Use: Never used  Substance and Sexual Activity   Alcohol use: No   Drug use: No   Sexual activity: Yes  Other Topics Concern   Not on file  Social History Narrative   Not on file   Social Determinants of Health   Financial Resource Strain: Not on file  Food Insecurity: Not on file  Transportation Needs: Not on file  Physical Activity: Not on file  Stress: Not on file  Social Connections: Not on file   Family History  Problem Relation Age of Onset   Arthritis Mother    Hyperlipidemia Mother    Hypertension Mother    Mental illness Mother    Thyroid disease Mother    Alcohol abuse Father    Drug  abuse Father     Review of Systems  Constitutional: Negative.   HENT: Negative.    Eyes: Negative.   Respiratory: Negative.    Cardiovascular: Negative.   Gastrointestinal: Negative.   Endocrine: Negative.   Genitourinary: Negative.   Musculoskeletal:  Positive for back pain. Negative for arthralgias, gait problem, joint swelling, myalgias, neck pain and neck stiffness.  Skin: Negative.   Allergic/Immunologic: Negative.   Neurological: Negative.   Psychiatric/Behavioral: Negative.     Per HPI unless specifically indicated above     Objective:    BP 130/74   Pulse 74   Temp 97.9 F (36.6 C)   Ht 6' 0.5" (1.842 m)   Wt 279 lb 9.6 oz (126.8 kg)   SpO2 97%   BMI 37.40 kg/m   Wt Readings from Last 3 Encounters:  02/28/21 279 lb 9.6 oz (126.8 kg)  11/09/20 277 lb (125.6 kg)  10/26/20 274 lb 6.4 oz (124.5 kg)    Physical  Exam Vitals and nursing note reviewed.  Constitutional:      General: He is not in acute distress.    Appearance: Normal appearance. He is obese. He is not ill-appearing, toxic-appearing or diaphoretic.  HENT:     Head: Normocephalic and atraumatic.     Comments: Poor dentition    Right Ear: External ear normal.     Left Ear: External ear normal.     Nose: Nose normal.     Mouth/Throat:     Mouth: Mucous membranes are moist.     Pharynx: Oropharynx is clear.  Eyes:     General: No scleral icterus.       Right eye: No discharge.        Left eye: No discharge.     Extraocular Movements: Extraocular movements intact.     Conjunctiva/sclera: Conjunctivae normal.     Pupils: Pupils are equal, round, and reactive to light.  Cardiovascular:     Rate and Rhythm: Normal rate and regular rhythm.     Pulses: Normal pulses.     Heart sounds: Normal heart sounds. No murmur heard.   No friction rub. No gallop.  Pulmonary:     Effort: Pulmonary effort is normal. No respiratory distress.     Breath sounds: Normal breath sounds. No stridor. No wheezing, rhonchi or rales.  Chest:     Chest wall: No tenderness.  Musculoskeletal:        General: Normal range of motion.     Cervical back: Normal range of motion and neck supple.  Skin:    General: Skin is warm and dry.     Capillary Refill: Capillary refill takes less than 2 seconds.     Coloration: Skin is not jaundiced or pale.     Findings: No bruising, erythema, lesion or rash.  Neurological:     General: No focal deficit present.     Mental Status: He is alert and oriented to person, place, and time. Mental status is at baseline.  Psychiatric:        Mood and Affect: Mood normal.        Behavior: Behavior normal.        Thought Content: Thought content normal.        Judgment: Judgment normal.    Results for orders placed or performed in visit on 11/09/20  Microalbumin, Urine Waived  Result Value Ref Range   Microalb, Ur Waived 10  0 - 19 mg/L   Creatinine, Urine Waived 100 10 -  300 mg/dL   Microalb/Creat Ratio <30 <30 mg/g  Comprehensive metabolic panel  Result Value Ref Range   Glucose 98 65 - 99 mg/dL   BUN 9 6 - 24 mg/dL   Creatinine, Ser 0.73 (L) 0.76 - 1.27 mg/dL   eGFR 107 >59 mL/min/1.73   BUN/Creatinine Ratio 12 9 - 20   Sodium 142 134 - 144 mmol/L   Potassium 4.5 3.5 - 5.2 mmol/L   Chloride 104 96 - 106 mmol/L   CO2 19 (L) 20 - 29 mmol/L   Calcium 9.5 8.7 - 10.2 mg/dL   Total Protein 7.1 6.0 - 8.5 g/dL   Albumin 4.6 3.8 - 4.9 g/dL   Globulin, Total 2.5 1.5 - 4.5 g/dL   Albumin/Globulin Ratio 1.8 1.2 - 2.2   Bilirubin Total <0.2 0.0 - 1.2 mg/dL   Alkaline Phosphatase 86 44 - 121 IU/L   AST 26 0 - 40 IU/L   ALT 25 0 - 44 IU/L  CBC with Differential/Platelet  Result Value Ref Range   WBC 7.9 3.4 - 10.8 x10E3/uL   RBC 5.10 4.14 - 5.80 x10E6/uL   Hemoglobin 16.2 13.0 - 17.7 g/dL   Hematocrit 47.8 37.5 - 51.0 %   MCV 94 79 - 97 fL   MCH 31.8 26.6 - 33.0 pg   MCHC 33.9 31.5 - 35.7 g/dL   RDW 12.6 11.6 - 15.4 %   Platelets 272 150 - 450 x10E3/uL   Neutrophils 55 Not Estab. %   Lymphs 31 Not Estab. %   Monocytes 10 Not Estab. %   Eos 3 Not Estab. %   Basos 1 Not Estab. %   Neutrophils Absolute 4.4 1.4 - 7.0 x10E3/uL   Lymphocytes Absolute 2.4 0.7 - 3.1 x10E3/uL   Monocytes Absolute 0.8 0.1 - 0.9 x10E3/uL   EOS (ABSOLUTE) 0.3 0.0 - 0.4 x10E3/uL   Basophils Absolute 0.0 0.0 - 0.2 x10E3/uL   Immature Granulocytes 0 Not Estab. %   Immature Grans (Abs) 0.0 0.0 - 0.1 x10E3/uL  Lipid Panel w/o Chol/HDL Ratio  Result Value Ref Range   Cholesterol, Total 218 (H) 100 - 199 mg/dL   Triglycerides 71 0 - 149 mg/dL   HDL 51 >39 mg/dL   VLDL Cholesterol Cal 13 5 - 40 mg/dL   LDL Chol Calc (NIH) 154 (H) 0 - 99 mg/dL  PSA  Result Value Ref Range   Prostate Specific Ag, Serum 0.7 0.0 - 4.0 ng/mL  TSH  Result Value Ref Range   TSH 2.930 0.450 - 4.500 uIU/mL  Urinalysis, Routine w reflex microscopic   Result Value Ref Range   Specific Gravity, UA 1.015 1.005 - 1.030   pH, UA 7.0 5.0 - 7.5   Color, UA Yellow Yellow   Appearance Ur Clear Clear   Leukocytes,UA Negative Negative   Protein,UA Negative Negative/Trace   Glucose, UA Negative Negative   Ketones, UA Negative Negative   RBC, UA Negative Negative   Bilirubin, UA Negative Negative   Urobilinogen, Ur 0.2 0.2 - 1.0 mg/dL   Nitrite, UA Negative Negative  Bayer DCA Hb A1c Waived  Result Value Ref Range   HB A1C (BAYER DCA - WAIVED) 5.7 <7.0 %  HIV Antibody (routine testing w rflx)  Result Value Ref Range   HIV Screen 4th Generation wRfx Non Reactive Non Reactive  Hepatitis C antibody  Result Value Ref Range   Hep C Virus Ab <0.1 0.0 - 0.9 s/co ratio      Assessment & Plan:  Problem List Items Addressed This Visit   None Visit Diagnoses     Pre-op examination    -  Primary   Feeling well. EKG normal. Will check labs. Assuming labs are normal, should be cleared for surgery.   Relevant Orders   Comprehensive metabolic panel   CBC with Differential/Platelet   CoaguChek XS/INR Waived   EKG 12-Lead (Completed)        Follow up plan: Return if symptoms worsen or fail to improve.

## 2021-03-01 LAB — CBC WITH DIFFERENTIAL/PLATELET
Basophils Absolute: 0.1 10*3/uL (ref 0.0–0.2)
Basos: 1 %
EOS (ABSOLUTE): 0.3 10*3/uL (ref 0.0–0.4)
Eos: 3 %
Hematocrit: 45 % (ref 37.5–51.0)
Hemoglobin: 15.9 g/dL (ref 13.0–17.7)
Immature Grans (Abs): 0 10*3/uL (ref 0.0–0.1)
Immature Granulocytes: 0 %
Lymphocytes Absolute: 2.7 10*3/uL (ref 0.7–3.1)
Lymphs: 27 %
MCH: 33.1 pg — ABNORMAL HIGH (ref 26.6–33.0)
MCHC: 35.3 g/dL (ref 31.5–35.7)
MCV: 94 fL (ref 79–97)
Monocytes Absolute: 0.9 10*3/uL (ref 0.1–0.9)
Monocytes: 9 %
Neutrophils Absolute: 6.1 10*3/uL (ref 1.4–7.0)
Neutrophils: 60 %
Platelets: 254 10*3/uL (ref 150–450)
RBC: 4.81 x10E6/uL (ref 4.14–5.80)
RDW: 12.9 % (ref 11.6–15.4)
WBC: 10 10*3/uL (ref 3.4–10.8)

## 2021-03-01 LAB — COMPREHENSIVE METABOLIC PANEL
ALT: 21 IU/L (ref 0–44)
AST: 19 IU/L (ref 0–40)
Albumin/Globulin Ratio: 1.9 (ref 1.2–2.2)
Albumin: 4.5 g/dL (ref 3.8–4.9)
Alkaline Phosphatase: 81 IU/L (ref 44–121)
BUN/Creatinine Ratio: 14 (ref 9–20)
BUN: 11 mg/dL (ref 6–24)
Bilirubin Total: 0.3 mg/dL (ref 0.0–1.2)
CO2: 24 mmol/L (ref 20–29)
Calcium: 9.2 mg/dL (ref 8.7–10.2)
Chloride: 104 mmol/L (ref 96–106)
Creatinine, Ser: 0.81 mg/dL (ref 0.76–1.27)
Globulin, Total: 2.4 g/dL (ref 1.5–4.5)
Glucose: 111 mg/dL — ABNORMAL HIGH (ref 65–99)
Potassium: 4.4 mmol/L (ref 3.5–5.2)
Sodium: 143 mmol/L (ref 134–144)
Total Protein: 6.9 g/dL (ref 6.0–8.5)
eGFR: 103 mL/min/{1.73_m2} (ref >59)

## 2021-03-01 NOTE — Progress Notes (Signed)
Patient notified of results  by mychart  Labs look nice and normal. Cleared for surgery.   Please send copy of note to his neurosurgeon. Thanks!

## 2021-03-08 ENCOUNTER — Encounter: Payer: Self-pay | Admitting: Family Medicine

## 2021-03-09 ENCOUNTER — Encounter: Payer: Self-pay | Admitting: Family Medicine

## 2021-03-09 ENCOUNTER — Telehealth: Payer: BC Managed Care – PPO | Admitting: Family Medicine

## 2021-03-09 DIAGNOSIS — M5136 Other intervertebral disc degeneration, lumbar region: Secondary | ICD-10-CM

## 2021-03-09 MED ORDER — GABAPENTIN 100 MG PO CAPS: 100.0000 mg | ORAL_CAPSULE | Freq: Three times a day (TID) | ORAL | 3 refills | Status: DC

## 2021-03-09 MED ORDER — HYDROCODONE-ACETAMINOPHEN 10-325 MG PO TABS: 1.0000 | ORAL_TABLET | Freq: Three times a day (TID) | ORAL | 0 refills | Status: AC | PRN

## 2021-03-09 NOTE — Progress Notes (Signed)
There were no vitals taken for this visit.   Subjective:    Patient ID: Fred Tran, male    DOB: 20-Mar-1966, 55 y.o.   MRN: [id number]  HPI: Fred Tran is a 55 y.o. male  Chief Complaint  Patient presents with   Back Pain    Patient states he is getting surgery for his back August 8, is taking baclofen but states it does not work. Patient states his surgeon told him to reach out to his PCP for medication   BACK PAIN Duration: months Mechanism of injury: unknown Location: bilateral and low back Onset: gradual Severity: severe Quality: shooting, aching Frequency: constant- better with sitting Radiation: L leg below the knee Aggravating factors: lifting, movement, and walking Alleviating factors: rest, ice, heat, laying, NSAIDs, APAP, and muscle relaxer Status: worse Treatments attempted: rest, ice, heat, APAP, ibuprofen, aleve, physical therapy, and HEP  Relief with NSAIDs?: mild Nighttime pain:  yes Paresthesias / decreased sensation:  yes Bowel / bladder incontinence:  no Fevers:  no Dysuria / urinary frequency:  no  Relevant past medical, surgical, family and social history reviewed and updated as indicated. Interim medical history since our last visit reviewed. Allergies and medications reviewed and updated.  Review of Systems  Constitutional: Negative.   Respiratory: Negative.    Cardiovascular: Negative.   Musculoskeletal:  Positive for back pain, gait problem and myalgias. Negative for arthralgias, joint swelling, neck pain and neck stiffness.  Skin: Negative.   Neurological:  Positive for weakness and numbness. Negative for dizziness, tremors, seizures, syncope, facial asymmetry, speech difficulty, light-headedness and headaches.  Psychiatric/Behavioral: Negative.     Per HPI unless specifically indicated above     Objective:    There were no vitals taken for this visit.  Wt Readings from Last 3 Encounters:  02/28/21 279 lb 9.6 oz (126.8 kg)   11/09/20 277 lb (125.6 kg)  10/26/20 274 lb 6.4 oz (124.5 kg)    Physical Exam Vitals and nursing note reviewed.  Constitutional:      General: He is not in acute distress.    Appearance: Normal appearance. He is not ill-appearing, toxic-appearing or diaphoretic.  HENT:     Head: Normocephalic and atraumatic.     Right Ear: External ear normal.     Left Ear: External ear normal.     Nose: Nose normal.     Mouth/Throat:     Mouth: Mucous membranes are moist.     Pharynx: Oropharynx is clear.  Eyes:     General: No scleral icterus.       Right eye: No discharge.        Left eye: No discharge.     Extraocular Movements: Extraocular movements intact.     Conjunctiva/sclera: Conjunctivae normal.     Pupils: Pupils are equal, round, and reactive to light.  Cardiovascular:     Rate and Rhythm: Normal rate and regular rhythm.     Pulses: Normal pulses.     Heart sounds: Normal heart sounds. No murmur heard.   No friction rub. No gallop.  Pulmonary:     Effort: Pulmonary effort is normal. No respiratory distress.     Breath sounds: Normal breath sounds. No stridor. No wheezing, rhonchi or rales.  Chest:     Chest wall: No tenderness.  Musculoskeletal:        General: Normal range of motion.     Cervical back: Normal range of motion and neck supple.  Skin:  General: Skin is warm and dry.     Capillary Refill: Capillary refill takes less than 2 seconds.     Coloration: Skin is not jaundiced or pale.     Findings: No bruising, erythema, lesion or rash.  Neurological:     General: No focal deficit present.     Mental Status: He is alert and oriented to person, place, and time. Mental status is at baseline.  Psychiatric:        Mood and Affect: Mood normal.        Behavior: Behavior normal.        Thought Content: Thought content normal.        Judgment: Judgment normal.    Results for orders placed or performed in visit on 02/28/21  Comprehensive metabolic panel  Result  Value Ref Range   Glucose 111 (H) 65 - 99 mg/dL   BUN 11 6 - 24 mg/dL   Creatinine, Ser 0.81 0.76 - 1.27 mg/dL   eGFR 103 >59 mL/min/1.73   BUN/Creatinine Ratio 14 9 - 20   Sodium 143 134 - 144 mmol/L   Potassium 4.4 3.5 - 5.2 mmol/L   Chloride 104 96 - 106 mmol/L   CO2 24 20 - 29 mmol/L   Calcium 9.2 8.7 - 10.2 mg/dL   Total Protein 6.9 6.0 - 8.5 g/dL   Albumin 4.5 3.8 - 4.9 g/dL   Globulin, Total 2.4 1.5 - 4.5 g/dL   Albumin/Globulin Ratio 1.9 1.2 - 2.2   Bilirubin Total 0.3 0.0 - 1.2 mg/dL   Alkaline Phosphatase 81 44 - 121 IU/L   AST 19 0 - 40 IU/L   ALT 21 0 - 44 IU/L  CBC with Differential/Platelet  Result Value Ref Range   WBC 10.0 3.4 - 10.8 x10E3/uL   RBC 4.81 4.14 - 5.80 x10E6/uL   Hemoglobin 15.9 13.0 - 17.7 g/dL   Hematocrit 45.0 37.5 - 51.0 %   MCV 94 79 - 97 fL   MCH 33.1 (H) 26.6 - 33.0 pg   MCHC 35.3 31.5 - 35.7 g/dL   RDW 12.9 11.6 - 15.4 %   Platelets 254 150 - 450 x10E3/uL   Neutrophils 60 Not Estab. %   Lymphs 27 Not Estab. %   Monocytes 9 Not Estab. %   Eos 3 Not Estab. %   Basos 1 Not Estab. %   Neutrophils Absolute 6.1 1.4 - 7.0 x10E3/uL   Lymphocytes Absolute 2.7 0.7 - 3.1 x10E3/uL   Monocytes Absolute 0.9 0.1 - 0.9 x10E3/uL   EOS (ABSOLUTE) 0.3 0.0 - 0.4 x10E3/uL   Basophils Absolute 0.1 0.0 - 0.2 x10E3/uL   Immature Granulocytes 0 Not Estab. %   Immature Grans (Abs) 0.0 0.0 - 0.1 x10E3/uL  CoaguChek XS/INR Waived  Result Value Ref Range   INR 1.0 0.9 - 1.1   Prothrombin Time 11.8 sec      Assessment & Plan:   Problem List Items Addressed This Visit       Musculoskeletal and Integument   Other intervertebral disc degeneration, lumbar region    Having surgery 04/17/21. Not on any medicine. Will give short course pf hydrocodone and start gabapentin to help until surgery. Call with any concerns.        Relevant Medications   HYDROcodone-acetaminophen (NORCO) 10-325 MG tablet     Follow up plan: Return Next week video visit  OK.   This visit was completed via video visit through [facility] due to the restrictions of the COVID-19  pandemic. All issues as above were discussed and addressed. Physical exam was done as above through visual confirmation on video through MyChart. If it was felt that the patient should be evaluated in the office, they were directed there. The patient verbally consented to this visit. Location of the patient: home Location of the provider: work Those involved with this call:  Provider: Park Liter, DO CMA: Louanna Raw, [place] Desk/Registration: Jill Side  Time spent on call:  15 minutes with patient face to face via video conference. More than 50% of this time was spent in counseling and coordination of care. 23 minutes total spent in review of patient's record and preparation of their chart.

## 2021-03-09 NOTE — Assessment & Plan Note (Signed)
Having surgery 04/17/21. Not on any medicine. Will give short course pf hydrocodone and start gabapentin to help until surgery. Call with any concerns.

## 2021-03-17 ENCOUNTER — Encounter: Payer: Self-pay | Admitting: Internal Medicine

## 2021-03-17 ENCOUNTER — Other Ambulatory Visit: Payer: Self-pay

## 2021-03-17 ENCOUNTER — Telehealth: Payer: Self-pay

## 2021-03-17 ENCOUNTER — Telehealth: Payer: BC Managed Care – PPO | Admitting: Family Medicine

## 2021-03-17 ENCOUNTER — Ambulatory Visit (INDEPENDENT_AMBULATORY_CARE_PROVIDER_SITE_OTHER): Payer: BC Managed Care – PPO | Admitting: Internal Medicine

## 2021-03-17 VITALS — BP 134/74 | HR 62 | Temp 98.9°F | Ht 72.52 in | Wt 278.4 lb

## 2021-03-17 DIAGNOSIS — K0889 Other specified disorders of teeth and supporting structures: Secondary | ICD-10-CM | POA: Diagnosis not present

## 2021-03-17 DIAGNOSIS — M549 Dorsalgia, unspecified: Secondary | ICD-10-CM

## 2021-03-17 DIAGNOSIS — Z720 Tobacco use: Secondary | ICD-10-CM | POA: Diagnosis not present

## 2021-03-17 MED ORDER — TRAMADOL HCL 50 MG PO TABS: 50.0000 mg | ORAL_TABLET | Freq: Three times a day (TID) | ORAL | 0 refills | Status: AC | PRN

## 2021-03-17 MED ORDER — AMOXICILLIN-POT CLAVULANATE 500-125 MG PO TABS: 1.0000 | ORAL_TABLET | Freq: Two times a day (BID) | ORAL | 0 refills | Status: AC

## 2021-03-17 NOTE — Progress Notes (Signed)
BP 134/74   Pulse 62   Temp 98.9 F (37.2 C) (Oral)   Ht 6' 0.52" (1.842 m)   Wt 278 lb 6.4 oz (126.3 kg)   SpO2 98%   BMI 37.22 kg/m    Subjective:    Patient ID: Fred Tran, male    DOB: 1965-12-26, 55 y.o.   MRN: [id number]  Chief Complaint  Patient presents with   Abcess of tooth    Broke tooth off last week, states he now has an abcess   Medication review    Here to talk about his Gabapentin    HPI: Fred Tran is a 55 y.o. male  Pt is here for a tooth abcess   Oral Pain  This is a new (pain started x 3 days ago, doesnt have a dentist but will make appt with wifes dentist. says his tooth broke off last week while he was chewing a hard candy out of the fridge says he is a chronic smoker and is about to quit.) problem. The current episode started 1 to 4 weeks ago.  Back Pain This is a chronic problem. The problem has been gradually improving since onset. The pain is present in the lumbar spine. The pain is at a severity of 5/10. The pain is moderate. Pertinent negatives include no paresthesias, pelvic pain, perianal numbness, tingling or weakness.   Chief Complaint  Patient presents with   Abcess of tooth    Broke tooth off last week, states he now has an abcess   Medication review    Here to talk about his Gabapentin    Relevant past medical, surgical, family and social history reviewed and updated as indicated. Interim medical history since our last visit reviewed. Allergies and medications reviewed and updated.  Review of Systems  Genitourinary:  Negative for pelvic pain.  Musculoskeletal:  Positive for back pain.  Neurological:  Negative for tingling, weakness and paresthesias.   Per HPI unless specifically indicated above     Objective:    BP 134/74   Pulse 62   Temp 98.9 F (37.2 C) (Oral)   Ht 6' 0.52" (1.842 m)   Wt 278 lb 6.4 oz (126.3 kg)   SpO2 98%   BMI 37.22 kg/m   Wt Readings from Last 3 Encounters:  03/17/21 278 lb 6.4 oz  (126.3 kg)  02/28/21 279 lb 9.6 oz (126.8 kg)  11/09/20 277 lb (125.6 kg)    Physical Exam Vitals and nursing note reviewed.  Constitutional:      General: He is not in acute distress.    Appearance: Normal appearance. He is not ill-appearing or diaphoretic.  HENT:     Head: Normocephalic and atraumatic.     Right Ear: Tympanic membrane and external ear normal. There is no impacted cerumen.     Left Ear: External ear normal.     Nose: No congestion or rhinorrhea.     Mouth/Throat:     Pharynx: No oropharyngeal exudate or posterior oropharyngeal erythema.  Eyes:     Conjunctiva/sclera: Conjunctivae normal.     Pupils: Pupils are equal, round, and reactive to light.  Cardiovascular:     Heart sounds: No murmur heard.   No friction rub. No gallop.  Pulmonary:     Effort: No respiratory distress.     Breath sounds: No stridor. No wheezing or rhonchi.  Chest:     Chest wall: No tenderness.  Abdominal:     Palpations: There is no mass.  Tenderness: There is no abdominal tenderness.  Musculoskeletal:     Cervical back: No rigidity or tenderness.     Left lower leg: No edema.  Neurological:     Mental Status: He is alert.         Current Outpatient Medications:    gabapentin (NEURONTIN) 100 MG capsule, Take 1 capsule (100 mg total) by mouth 3 (three) times daily., Disp: 90 capsule, Rfl: 3    Assessment & Plan:  Tooth abcess  Will start pt on augemntin x 7 days Will need ibubrufen q 8 hrly as needed for pain control and to reduce inflammation.  Is on gabapentin for back pain - to fu with ortho for back pain - to have surgery.  Will send off tramadol for pain in tooth   Back pain chronic, to have back surgeyr for such aug 01/22. adviced streches for back.  Will send off tramadol for 5 days. Pt to call ortho for a follow up on pain mx.    Tobacco abuse.  Smoking cessation advised. Pt will quit on his own.      Problem List Items Addressed This Visit   None     No orders of the defined types were placed in this encounter.    Meds ordered this encounter  Medications   amoxicillin-clavulanate (AUGMENTIN) 500-125 MG tablet    Sig: Take 1 tablet (500 mg total) by mouth 2 (two) times daily for 5 days.    Dispense:  10 tablet    Refill:  0   traMADol (ULTRAM) 50 MG tablet    Sig: Take 1 tablet (50 mg total) by mouth every 8 (eight) hours as needed for up to 5 days.    Dispense:  15 tablet    Refill:  0     Follow up plan: No follow-ups on file.

## 2021-03-17 NOTE — Telephone Encounter (Signed)
Called pt in regards to today's appt. Provider can not see and abscess virtually. Needs to be in office. No answer left vm to call back

## 2021-03-20 NOTE — Telephone Encounter (Signed)
Pt came into office.

## 2021-04-02 ENCOUNTER — Encounter: Payer: Self-pay | Admitting: Family Medicine

## 2021-04-04 ENCOUNTER — Encounter: Payer: Self-pay | Admitting: Family Medicine

## 2021-04-04 DIAGNOSIS — M48061 Spinal stenosis, lumbar region without neurogenic claudication: Secondary | ICD-10-CM | POA: Diagnosis not present

## 2021-04-04 DIAGNOSIS — M5416 Radiculopathy, lumbar region: Secondary | ICD-10-CM | POA: Diagnosis not present

## 2021-04-04 DIAGNOSIS — F17220 Nicotine dependence, chewing tobacco, uncomplicated: Secondary | ICD-10-CM | POA: Diagnosis not present

## 2021-04-04 DIAGNOSIS — Z01818 Encounter for other preprocedural examination: Secondary | ICD-10-CM | POA: Diagnosis not present

## 2021-04-04 DIAGNOSIS — M4726 Other spondylosis with radiculopathy, lumbar region: Secondary | ICD-10-CM | POA: Diagnosis not present

## 2021-04-04 DIAGNOSIS — F17210 Nicotine dependence, cigarettes, uncomplicated: Secondary | ICD-10-CM | POA: Diagnosis not present

## 2021-04-04 DIAGNOSIS — Z6838 Body mass index (BMI) 38.0-38.9, adult: Secondary | ICD-10-CM | POA: Diagnosis not present

## 2021-04-04 DIAGNOSIS — G919 Hydrocephalus, unspecified: Secondary | ICD-10-CM | POA: Diagnosis not present

## 2021-04-04 DIAGNOSIS — M5116 Intervertebral disc disorders with radiculopathy, lumbar region: Secondary | ICD-10-CM | POA: Diagnosis not present

## 2021-04-04 DIAGNOSIS — M438X6 Other specified deforming dorsopathies, lumbar region: Secondary | ICD-10-CM | POA: Diagnosis not present

## 2021-04-13 DIAGNOSIS — M48061 Spinal stenosis, lumbar region without neurogenic claudication: Secondary | ICD-10-CM | POA: Insufficient documentation

## 2021-04-14 ENCOUNTER — Telehealth: Payer: Self-pay | Admitting: *Deleted

## 2021-04-14 DIAGNOSIS — M5416 Radiculopathy, lumbar region: Secondary | ICD-10-CM | POA: Diagnosis not present

## 2021-04-14 NOTE — Telephone Encounter (Signed)
Pt brought in Disability form to be completed by Dr. Laural Benes Placed in folder for Dr. Laural Benes  [phone]

## 2021-04-17 DIAGNOSIS — M47816 Spondylosis without myelopathy or radiculopathy, lumbar region: Secondary | ICD-10-CM | POA: Diagnosis not present

## 2021-04-17 DIAGNOSIS — Z818 Family history of other mental and behavioral disorders: Secondary | ICD-10-CM | POA: Diagnosis not present

## 2021-04-17 DIAGNOSIS — Z981 Arthrodesis status: Secondary | ICD-10-CM | POA: Diagnosis not present

## 2021-04-17 DIAGNOSIS — M5416 Radiculopathy, lumbar region: Secondary | ICD-10-CM | POA: Diagnosis not present

## 2021-04-17 DIAGNOSIS — Z9889 Other specified postprocedural states: Secondary | ICD-10-CM | POA: Diagnosis not present

## 2021-04-17 DIAGNOSIS — F17210 Nicotine dependence, cigarettes, uncomplicated: Secondary | ICD-10-CM | POA: Diagnosis not present

## 2021-04-17 DIAGNOSIS — M4316 Spondylolisthesis, lumbar region: Secondary | ICD-10-CM | POA: Diagnosis not present

## 2021-04-17 DIAGNOSIS — M1990 Unspecified osteoarthritis, unspecified site: Secondary | ICD-10-CM | POA: Diagnosis not present

## 2021-04-17 DIAGNOSIS — M4726 Other spondylosis with radiculopathy, lumbar region: Secondary | ICD-10-CM | POA: Diagnosis not present

## 2021-04-17 DIAGNOSIS — M48061 Spinal stenosis, lumbar region without neurogenic claudication: Secondary | ICD-10-CM | POA: Diagnosis not present

## 2021-04-17 NOTE — Telephone Encounter (Signed)
We don't do disability in this office- can someone please look at form?

## 2021-04-18 DIAGNOSIS — F17200 Nicotine dependence, unspecified, uncomplicated: Secondary | ICD-10-CM | POA: Insufficient documentation

## 2021-04-19 NOTE — Telephone Encounter (Signed)
Fred Tran has the paperwork, she is waiting on a return call from the patient.

## 2021-04-24 ENCOUNTER — Telehealth: Payer: Self-pay

## 2021-04-24 NOTE — Telephone Encounter (Signed)
Patient informed paperwork is ready for pick-up.

## 2021-04-24 NOTE — Telephone Encounter (Signed)
Copied from CRM [id number]. Topic: General - Inquiry >> Apr 24, 2021 11:24 AM Aretta Nip wrote: CRM # [phone] Owner: None Status: Resolved Priority: Routine Created on: 04/21/2021 10:56 AM By: Gaetana Michaelis A Pls see above CRM. Pt wants update on forms and completion FU at [phone] pls advice pt of status over the short disability >> Apr 24, 2021  3:23 PM Pablo Ledger, CMA wrote: Carollee Herter did you speak to this patient today regarding this?

## 2021-04-25 ENCOUNTER — Telehealth: Payer: Self-pay | Admitting: Family Medicine

## 2021-04-25 NOTE — Telephone Encounter (Signed)
Pt came in and dropped off a [employer] (Short Term Disability Form) to be completed by the provider.  Upon completion the pt would like to pick it up.  Form placed in the providers folder.

## 2021-04-25 NOTE — Telephone Encounter (Signed)
I filled one of these out yesterday or Friday- is it the same form?

## 2021-04-26 ENCOUNTER — Encounter: Payer: Self-pay | Admitting: Family Medicine

## 2021-05-01 NOTE — Telephone Encounter (Signed)
Pt wants to know if the disability papers have been filled out yet.  If not maybe when?  CB#  [phone]

## 2021-05-02 NOTE — Telephone Encounter (Signed)
Patient checking on the status of [employer] short term disability forms faxed over on 04/06/2021 and patient dropped off hard copy last week. Patient would like a follow up call today regarding the status Best # [phone]

## 2021-06-01 DIAGNOSIS — M5116 Intervertebral disc disorders with radiculopathy, lumbar region: Secondary | ICD-10-CM | POA: Diagnosis not present

## 2021-06-01 DIAGNOSIS — M5416 Radiculopathy, lumbar region: Secondary | ICD-10-CM | POA: Diagnosis not present

## 2021-06-09 ENCOUNTER — Telehealth (INDEPENDENT_AMBULATORY_CARE_PROVIDER_SITE_OTHER): Payer: BC Managed Care – PPO | Admitting: Family Medicine

## 2021-06-09 ENCOUNTER — Other Ambulatory Visit: Payer: Self-pay

## 2021-06-09 ENCOUNTER — Encounter: Payer: Self-pay | Admitting: Family Medicine

## 2021-06-09 DIAGNOSIS — S025XXA Fracture of tooth (traumatic), initial encounter for closed fracture: Secondary | ICD-10-CM

## 2021-06-09 MED ORDER — AMOXICILLIN-POT CLAVULANATE 875-125 MG PO TABS: 1.0000 | ORAL_TABLET | Freq: Two times a day (BID) | ORAL | 0 refills | Status: DC

## 2021-06-09 NOTE — Progress Notes (Signed)
There were no vitals taken for this visit.   Subjective:    Patient ID: Fred Tran, male    DOB: 11/25/1965, 55 y.o.   MRN: [id number]  HPI: Fred Tran is a 55 y.o. male  Chief Complaint  Patient presents with   Dental Pain   DENTAL PAIN Duration: 2 days Involved teeth: right and upper Dentist evaluation: no Mechanism of injury:  unknown Onset: sudden Aggravating factors: cold, heat, hard foods, and chewing Alleviating factors: nothing Status: worse Treatments attempted: NSAIDs Relief with NSAIDs?: mild Fevers: no Swelling: yes Redness: yes Paresthesias / decreased sensation: no Sinus pressure: no  Relevant past medical, surgical, family and social history reviewed and updated as indicated. Interim medical history since our last visit reviewed. Allergies and medications reviewed and updated.  Review of Systems  Constitutional: Negative.   HENT:  Positive for dental problem. Negative for congestion, drooling, ear discharge, ear pain, facial swelling, hearing loss, mouth sores, nosebleeds, postnasal drip, rhinorrhea, sinus pressure, sinus pain, sneezing, sore throat, tinnitus, trouble swallowing and voice change.   Respiratory: Negative.    Cardiovascular: Negative.   Gastrointestinal: Negative.   Skin: Negative.   Psychiatric/Behavioral: Negative.     Per HPI unless specifically indicated above     Objective:    There were no vitals taken for this visit.  Wt Readings from Last 3 Encounters:  03/17/21 278 lb 6.4 oz (126.3 kg)  02/28/21 279 lb 9.6 oz (126.8 kg)  11/09/20 277 lb (125.6 kg)    Physical Exam Vitals and nursing note reviewed.  Pulmonary:     Effort: Pulmonary effort is normal. No respiratory distress.     Comments: Speaking in full sentences Neurological:     Mental Status: He is alert.  Psychiatric:        Mood and Affect: Mood normal.        Behavior: Behavior normal.        Thought Content: Thought content normal.         Judgment: Judgment normal.    Results for orders placed or performed in visit on 02/28/21  Comprehensive metabolic panel  Result Value Ref Range   Glucose 111 (H) 65 - 99 mg/dL   BUN 11 6 - 24 mg/dL   Creatinine, Ser 0.81 0.76 - 1.27 mg/dL   eGFR 103 >59 mL/min/1.73   BUN/Creatinine Ratio 14 9 - 20   Sodium 143 134 - 144 mmol/L   Potassium 4.4 3.5 - 5.2 mmol/L   Chloride 104 96 - 106 mmol/L   CO2 24 20 - 29 mmol/L   Calcium 9.2 8.7 - 10.2 mg/dL   Total Protein 6.9 6.0 - 8.5 g/dL   Albumin 4.5 3.8 - 4.9 g/dL   Globulin, Total 2.4 1.5 - 4.5 g/dL   Albumin/Globulin Ratio 1.9 1.2 - 2.2   Bilirubin Total 0.3 0.0 - 1.2 mg/dL   Alkaline Phosphatase 81 44 - 121 IU/L   AST 19 0 - 40 IU/L   ALT 21 0 - 44 IU/L  CBC with Differential/Platelet  Result Value Ref Range   WBC 10.0 3.4 - 10.8 x10E3/uL   RBC 4.81 4.14 - 5.80 x10E6/uL   Hemoglobin 15.9 13.0 - 17.7 g/dL   Hematocrit 45.0 37.5 - 51.0 %   MCV 94 79 - 97 fL   MCH 33.1 (H) 26.6 - 33.0 pg   MCHC 35.3 31.5 - 35.7 g/dL   RDW 12.9 11.6 - 15.4 %   Platelets 254 150 - 450  x10E3/uL   Neutrophils 60 Not Estab. %   Lymphs 27 Not Estab. %   Monocytes 9 Not Estab. %   Eos 3 Not Estab. %   Basos 1 Not Estab. %   Neutrophils Absolute 6.1 1.4 - 7.0 x10E3/uL   Lymphocytes Absolute 2.7 0.7 - 3.1 x10E3/uL   Monocytes Absolute 0.9 0.1 - 0.9 x10E3/uL   EOS (ABSOLUTE) 0.3 0.0 - 0.4 x10E3/uL   Basophils Absolute 0.1 0.0 - 0.2 x10E3/uL   Immature Granulocytes 0 Not Estab. %   Immature Grans (Abs) 0.0 0.0 - 0.1 x10E3/uL  CoaguChek XS/INR Waived  Result Value Ref Range   INR 1.0 0.9 - 1.1   Prothrombin Time 11.8 sec      Assessment & Plan:   Problem List Items Addressed This Visit   None Visit Diagnoses     Closed fracture of tooth, initial encounter    -  Primary   Will treat with augmentin. Advised follow up with dentistry. Call with any concerns. Continue to monitor.         Follow up plan: Return if symptoms worsen or fail to  improve.    This visit was completed via telephone due to the restrictions of the COVID-19 pandemic. All issues as above were discussed and addressed but no physical exam was performed. If it was felt that the patient should be evaluated in the office, they were directed there. The patient verbally consented to this visit. Patient was unable to complete an audio/visual visit due to Technical difficulties. Due to the catastrophic nature of the COVID-19 pandemic, this visit was done through audio contact only. Location of the patient: home Location of the provider: home Those involved with this call:  Provider: Park Liter, DO CMA:  Marnette Burgess, CMA Front Desk/Registration: [employer]  Time spent on call:  15 minutes on the phone discussing health concerns. 23 minutes total spent in review of patient's record and preparation of their chart.

## 2021-07-05 ENCOUNTER — Ambulatory Visit (INDEPENDENT_AMBULATORY_CARE_PROVIDER_SITE_OTHER): Payer: BC Managed Care – PPO | Admitting: Internal Medicine

## 2021-07-05 ENCOUNTER — Encounter: Payer: Self-pay | Admitting: Internal Medicine

## 2021-07-05 ENCOUNTER — Other Ambulatory Visit: Payer: Self-pay

## 2021-07-05 VITALS — BP 138/76 | HR 78 | Temp 97.9°F | Ht 72.44 in | Wt 269.0 lb

## 2021-07-05 DIAGNOSIS — Z23 Encounter for immunization: Secondary | ICD-10-CM | POA: Diagnosis not present

## 2021-07-05 DIAGNOSIS — R42 Dizziness and giddiness: Secondary | ICD-10-CM | POA: Insufficient documentation

## 2021-07-05 LAB — URINALYSIS, ROUTINE W REFLEX MICROSCOPIC
Bilirubin, UA: NEGATIVE
Glucose, UA: NEGATIVE
Ketones, UA: NEGATIVE
Leukocytes,UA: NEGATIVE
Nitrite, UA: NEGATIVE
RBC, UA: NEGATIVE
Specific Gravity, UA: 1.02 (ref 1.005–1.030)
Urobilinogen, Ur: 0.2 mg/dL (ref 0.2–1.0)
pH, UA: 6.5 (ref 5.0–7.5)

## 2021-07-05 LAB — CBC WITH DIFFERENTIAL/PLATELET
Hematocrit: 43.8 % (ref 37.5–51.0)
Hemoglobin: 15.7 g/dL (ref 13.0–17.7)
Lymphocytes Absolute: 2.1 10*3/uL (ref 0.7–3.1)
Lymphs: 25 %
MCH: 33.2 pg — ABNORMAL HIGH (ref 26.6–33.0)
MCHC: 35.8 g/dL — ABNORMAL HIGH (ref 31.5–35.7)
MCV: 93 fL (ref 79–97)
MID (Absolute): 0.3 10*3/uL (ref 0.1–1.6)
MID: 4 %
Neutrophils Absolute: 6.1 10*3/uL (ref 1.4–7.0)
Neutrophils: 71 %
Platelets: 294 10*3/uL (ref 150–450)
RBC: 4.73 x10E6/uL (ref 4.14–5.80)
RDW: 13.9 % (ref 11.6–15.4)
WBC: 8.5 10*3/uL (ref 3.4–10.8)

## 2021-07-05 LAB — BAYER DCA HB A1C WAIVED: HB A1C (BAYER DCA - WAIVED): 5.5 % (ref 4.8–5.6)

## 2021-07-05 MED ORDER — FEXOFENADINE HCL 180 MG PO TABS: 180.0000 mg | ORAL_TABLET | Freq: Every day | ORAL | 1 refills | Status: AC

## 2021-07-05 MED ORDER — AMOXICILLIN-POT CLAVULANATE 500-125 MG PO TABS: 1.0000 | ORAL_TABLET | Freq: Two times a day (BID) | ORAL | 0 refills | Status: AC

## 2021-07-05 NOTE — Progress Notes (Signed)
BP 138/76   Pulse 78   Temp 97.9 F (36.6 C) (Oral)   Ht 6' 0.44" (1.84 m)   Wt 269 lb (122 kg)   SpO2 98%   BMI 36.04 kg/m    Subjective:    Patient ID: Fred Tran, male    DOB: 09-16-1965, 55 y.o.   MRN: [id number]  Chief Complaint  Patient presents with  . Dizziness    Patient states that after he ate this morning he felt light headed, felt chills, and also super tired.     HPI: Fred Tran is a 55 y.o. male  Pt was at work this moring 8 : 10 am felt dizzy and felt clammy and stopped and went to the office and had a bottle of orange juice and felt better. Hadnt had his breakfast. Aug 8th had back surg and doenst want to fall  No bleeding from any orifice, no ho DM, no melena/ no BRBPR.  No urinary symptoms   Dizziness This is a new (felt the area around him was spinning.) problem. The current episode started today. Associated symptoms include chills. Pertinent negatives include no abdominal pain, anorexia, arthralgias, change in bowel habit, chest pain, congestion, coughing, diaphoresis, fatigue, fever, headaches, joint swelling, myalgias, nausea, neck pain, numbness, rash, sore throat, swollen glands, urinary symptoms, visual change, vomiting or weakness.   Chief Complaint  Patient presents with  . Dizziness    Patient states that after he ate this morning he felt light headed, felt chills, and also super tired.     Relevant past medical, surgical, family and social history reviewed and updated as indicated. Interim medical history since our last visit reviewed. Allergies and medications reviewed and updated.  Review of Systems  Constitutional:  Positive for chills. Negative for diaphoresis, fatigue and fever.  HENT:  Negative for congestion and sore throat.   Respiratory:  Negative for cough.   Cardiovascular:  Negative for chest pain.  Gastrointestinal:  Negative for abdominal pain, anorexia, change in bowel habit, nausea and vomiting.   Musculoskeletal:  Negative for arthralgias, joint swelling, myalgias and neck pain.  Skin:  Negative for rash.  Neurological:  Positive for dizziness. Negative for weakness, numbness and headaches.   Per HPI unless specifically indicated above     Objective:    BP 138/76   Pulse 78   Temp 97.9 F (36.6 C) (Oral)   Ht 6' 0.44" (1.84 m)   Wt 269 lb (122 kg)   SpO2 98%   BMI 36.04 kg/m   Wt Readings from Last 3 Encounters:  07/05/21 269 lb (122 kg)  03/17/21 278 lb 6.4 oz (126.3 kg)  02/28/21 279 lb 9.6 oz (126.8 kg)    Physical Exam Vitals and nursing note reviewed.  Constitutional:      General: He is not in acute distress.    Appearance: Normal appearance. He is not ill-appearing or diaphoretic.  HENT:     Head: Normocephalic and atraumatic.     Right Ear: Tympanic membrane and external ear normal. There is no impacted cerumen.     Left Ear: External ear normal.     Nose: No congestion or rhinorrhea.     Mouth/Throat:     Pharynx: No oropharyngeal exudate or posterior oropharyngeal erythema.  Eyes:     Conjunctiva/sclera: Conjunctivae normal.     Pupils: Pupils are equal, round, and reactive to light.  Cardiovascular:     Rate and Rhythm: Normal rate and regular rhythm.  Heart sounds: No murmur heard.   No friction rub. No gallop.  Pulmonary:     Effort: No respiratory distress.     Breath sounds: No stridor. No wheezing or rhonchi.  Chest:     Chest wall: No tenderness.  Abdominal:     General: Abdomen is flat. Bowel sounds are normal.     Palpations: Abdomen is soft. There is no mass.     Tenderness: There is no abdominal tenderness.  Musculoskeletal:     Cervical back: Normal range of motion and neck supple. No rigidity or tenderness.     Left lower leg: No edema.  Skin:    General: Skin is warm and dry.  Neurological:     Mental Status: He is alert.    Results for orders placed or performed in visit on 02/28/21  Comprehensive metabolic panel   Result Value Ref Range   Glucose 111 (H) 65 - 99 mg/dL   BUN 11 6 - 24 mg/dL   Creatinine, Ser 0.81 0.76 - 1.27 mg/dL   eGFR 103 >59 mL/min/1.73   BUN/Creatinine Ratio 14 9 - 20   Sodium 143 134 - 144 mmol/L   Potassium 4.4 3.5 - 5.2 mmol/L   Chloride 104 96 - 106 mmol/L   CO2 24 20 - 29 mmol/L   Calcium 9.2 8.7 - 10.2 mg/dL   Total Protein 6.9 6.0 - 8.5 g/dL   Albumin 4.5 3.8 - 4.9 g/dL   Globulin, Total 2.4 1.5 - 4.5 g/dL   Albumin/Globulin Ratio 1.9 1.2 - 2.2   Bilirubin Total 0.3 0.0 - 1.2 mg/dL   Alkaline Phosphatase 81 44 - 121 IU/L   AST 19 0 - 40 IU/L   ALT 21 0 - 44 IU/L  CBC with Differential/Platelet  Result Value Ref Range   WBC 10.0 3.4 - 10.8 x10E3/uL   RBC 4.81 4.14 - 5.80 x10E6/uL   Hemoglobin 15.9 13.0 - 17.7 g/dL   Hematocrit 45.0 37.5 - 51.0 %   MCV 94 79 - 97 fL   MCH 33.1 (H) 26.6 - 33.0 pg   MCHC 35.3 31.5 - 35.7 g/dL   RDW 12.9 11.6 - 15.4 %   Platelets 254 150 - 450 x10E3/uL   Neutrophils 60 Not Estab. %   Lymphs 27 Not Estab. %   Monocytes 9 Not Estab. %   Eos 3 Not Estab. %   Basos 1 Not Estab. %   Neutrophils Absolute 6.1 1.4 - 7.0 x10E3/uL   Lymphocytes Absolute 2.7 0.7 - 3.1 x10E3/uL   Monocytes Absolute 0.9 0.1 - 0.9 x10E3/uL   EOS (ABSOLUTE) 0.3 0.0 - 0.4 x10E3/uL   Basophils Absolute 0.1 0.0 - 0.2 x10E3/uL   Immature Granulocytes 0 Not Estab. %   Immature Grans (Abs) 0.0 0.0 - 0.1 x10E3/uL  CoaguChek XS/INR Waived  Result Value Ref Range   INR 1.0 0.9 - 1.1   Prothrombin Time 11.8 sec       No current outpatient medications on file.    Assessment & Plan:  Dizzines sec to OM will start pt on augmentin / allegra To use otc NSAIDs Check UA/ CBC/ A1c No ho diabetes.  Will need to fu with pcp if recurs.   Problem List Items Addressed This Visit   None Visit Diagnoses     Need for influenza vaccination    -  Primary        No orders of the defined types were placed in this encounter.  No orders of the defined types  were placed in this encounter.    Follow up plan: No follow-ups on file.  Total time spent with patient was > 30 mins including charting and cordinating care.

## 2021-07-06 LAB — COMPREHENSIVE METABOLIC PANEL
ALT: 15 IU/L (ref 0–44)
AST: 17 IU/L (ref 0–40)
Albumin/Globulin Ratio: 2.2 (ref 1.2–2.2)
Albumin: 4.6 g/dL (ref 3.8–4.9)
Alkaline Phosphatase: 78 IU/L (ref 44–121)
BUN/Creatinine Ratio: 14 (ref 9–20)
BUN: 11 mg/dL (ref 6–24)
Bilirubin Total: 0.3 mg/dL (ref 0.0–1.2)
CO2: 24 mmol/L (ref 20–29)
Calcium: 9.7 mg/dL (ref 8.7–10.2)
Chloride: 100 mmol/L (ref 96–106)
Creatinine, Ser: 0.77 mg/dL (ref 0.76–1.27)
Globulin, Total: 2.1 g/dL (ref 1.5–4.5)
Glucose: 161 mg/dL — ABNORMAL HIGH (ref 70–99)
Potassium: 4.7 mmol/L (ref 3.5–5.2)
Sodium: 140 mmol/L (ref 134–144)
Total Protein: 6.7 g/dL (ref 6.0–8.5)
eGFR: 105 mL/min/{1.73_m2} (ref >59)

## 2021-07-06 LAB — CBC WITH DIFFERENTIAL/PLATELET
Basophils Absolute: 0 10*3/uL (ref 0.0–0.2)
Basos: 1 %
EOS (ABSOLUTE): 0.2 10*3/uL (ref 0.0–0.4)
Eos: 2 %
Hematocrit: 45.5 % (ref 37.5–51.0)
Hemoglobin: 15.2 g/dL (ref 13.0–17.7)
Immature Grans (Abs): 0 10*3/uL (ref 0.0–0.1)
Immature Granulocytes: 0 %
Lymphocytes Absolute: 1.9 10*3/uL (ref 0.7–3.1)
Lymphs: 24 %
MCH: 31.6 pg (ref 26.6–33.0)
MCHC: 33.4 g/dL (ref 31.5–35.7)
MCV: 95 fL (ref 79–97)
Monocytes Absolute: 0.7 10*3/uL (ref 0.1–0.9)
Monocytes: 8 %
Neutrophils Absolute: 5.3 10*3/uL (ref 1.4–7.0)
Neutrophils: 65 %
Platelets: 291 10*3/uL (ref 150–450)
RBC: 4.81 x10E6/uL (ref 4.14–5.80)
RDW: 12.7 % (ref 11.6–15.4)
WBC: 8.1 10*3/uL (ref 3.4–10.8)

## 2021-08-02 ENCOUNTER — Ambulatory Visit (INDEPENDENT_AMBULATORY_CARE_PROVIDER_SITE_OTHER): Payer: BC Managed Care – PPO

## 2021-08-02 ENCOUNTER — Other Ambulatory Visit: Payer: Self-pay

## 2021-08-02 DIAGNOSIS — Z23 Encounter for immunization: Secondary | ICD-10-CM

## 2021-09-29 ENCOUNTER — Ambulatory Visit: Payer: Self-pay | Admitting: *Deleted

## 2021-09-29 DIAGNOSIS — M5442 Lumbago with sciatica, left side: Secondary | ICD-10-CM | POA: Diagnosis not present

## 2021-09-29 DIAGNOSIS — E785 Hyperlipidemia, unspecified: Secondary | ICD-10-CM | POA: Insufficient documentation

## 2021-09-29 DIAGNOSIS — M5136 Other intervertebral disc degeneration, lumbar region: Secondary | ICD-10-CM | POA: Diagnosis not present

## 2021-09-29 DIAGNOSIS — E669 Obesity, unspecified: Secondary | ICD-10-CM | POA: Insufficient documentation

## 2021-09-29 NOTE — Telephone Encounter (Signed)
Patient will need an appt

## 2021-09-29 NOTE — Telephone Encounter (Signed)
Summary: back pain   Pt called to be seen today for back pain/ no appts until Monday so pt didn't schedule / please advise        Chief Complaint: low back pain requesting medication until seeing surgeon on Tuesday 10/03/21 Symptoms: low back pain, "feels like water running down left leg"  Frequency: worsening since surgery  Pertinent Negatives: Patient denies inability to walk ,no numbness , weakness,  Disposition: [] ED /[x] Urgent Care (no appt availability in office) / [x] Appointment(In office/virtual)/ [x]  [facility]/ [] Home Care/ [x] Refused Recommended Disposition /[] [facility]/ []  Follow-up with PCP Additional Notes:  Please advise if any available VV today . Patient will be seen by surgeon on 10/03/21.  Recommended [place] e visit due to patient would like to be seen today .       Reason for Disposition  [1] MODERATE back pain (e.g., interferes with normal activities) AND [2] present > 3 days  Answer Assessment - Initial Assessment Questions 1. ONSET: "When did the pain begin?"      Getting worse since surgery  2. LOCATION: "Where does it hurt?" (upper, mid or lower back)     Low back  3. SEVERITY: "How bad is the pain?"  (e.g., Scale 1-10; mild, moderate, or severe)   - MILD (1-3): doesn't interfere with normal activities    - MODERATE (4-7): interferes with normal activities or awakens from sleep    - SEVERE (8-10): excruciating pain, unable to do any normal activities      Getting worse  4. PATTERN: "Is the pain constant?" (e.g., yes, no; constant, intermittent)      Constant  5. RADIATION: "Does the pain shoot into your legs or elsewhere?"     Shoots down left leg "feels like water running down my leg"  6. CAUSE:  "What do you think is causing the back pain?"      S/p surgery 7. BACK OVERUSE:  "Any recent lifting of heavy objects, strenuous work or exercise?"     Noted pain worsening at work 8. MEDICATIONS: "What have you taken so far for  the pain?" (e.g., nothing, acetaminophen, NSAIDS)     na 9. NEUROLOGIC SYMPTOMS: "Do you have any weakness, numbness, or problems with bowel/bladder control?"     no 10. OTHER SYMPTOMS: "Do you have any other symptoms?" (e.g., fever, abdominal pain, burning with urination, blood in urine)       Low back pain  11. PREGNANCY: "Is there any chance you are pregnant?" (e.g., yes, no; LMP)       na  Protocols used: Back Pain-A-AH

## 2021-10-02 NOTE — Telephone Encounter (Signed)
Lmom asking pt to call back to schedule an appt. °

## 2021-10-03 DIAGNOSIS — M5116 Intervertebral disc disorders with radiculopathy, lumbar region: Secondary | ICD-10-CM | POA: Diagnosis not present

## 2021-10-03 DIAGNOSIS — M25551 Pain in right hip: Secondary | ICD-10-CM | POA: Diagnosis not present

## 2021-10-03 DIAGNOSIS — M25552 Pain in left hip: Secondary | ICD-10-CM | POA: Diagnosis not present

## 2021-10-03 DIAGNOSIS — M545 Low back pain, unspecified: Secondary | ICD-10-CM | POA: Diagnosis not present

## 2021-10-03 DIAGNOSIS — M5416 Radiculopathy, lumbar region: Secondary | ICD-10-CM | POA: Diagnosis not present

## 2021-10-21 DIAGNOSIS — Z03818 Encounter for observation for suspected exposure to other biological agents ruled out: Secondary | ICD-10-CM | POA: Diagnosis not present

## 2021-10-21 DIAGNOSIS — J209 Acute bronchitis, unspecified: Secondary | ICD-10-CM | POA: Diagnosis not present

## 2021-10-21 DIAGNOSIS — U071 COVID-19: Secondary | ICD-10-CM | POA: Diagnosis not present

## 2021-10-21 DIAGNOSIS — J019 Acute sinusitis, unspecified: Secondary | ICD-10-CM | POA: Diagnosis not present

## 2021-10-31 DIAGNOSIS — R202 Paresthesia of skin: Secondary | ICD-10-CM | POA: Diagnosis not present

## 2021-10-31 DIAGNOSIS — Z981 Arthrodesis status: Secondary | ICD-10-CM | POA: Diagnosis not present

## 2021-10-31 DIAGNOSIS — M5416 Radiculopathy, lumbar region: Secondary | ICD-10-CM | POA: Diagnosis not present

## 2021-10-31 DIAGNOSIS — Z09 Encounter for follow-up examination after completed treatment for conditions other than malignant neoplasm: Secondary | ICD-10-CM | POA: Diagnosis not present

## 2021-10-31 DIAGNOSIS — M4726 Other spondylosis with radiculopathy, lumbar region: Secondary | ICD-10-CM | POA: Diagnosis not present

## 2021-10-31 DIAGNOSIS — Z9889 Other specified postprocedural states: Secondary | ICD-10-CM | POA: Diagnosis not present

## 2021-10-31 DIAGNOSIS — M47816 Spondylosis without myelopathy or radiculopathy, lumbar region: Secondary | ICD-10-CM | POA: Diagnosis not present

## 2021-10-31 DIAGNOSIS — M4807 Spinal stenosis, lumbosacral region: Secondary | ICD-10-CM | POA: Diagnosis not present

## 2021-10-31 DIAGNOSIS — Z6836 Body mass index (BMI) 36.0-36.9, adult: Secondary | ICD-10-CM | POA: Diagnosis not present

## 2021-10-31 DIAGNOSIS — M25552 Pain in left hip: Secondary | ICD-10-CM | POA: Diagnosis not present

## 2021-10-31 DIAGNOSIS — M48061 Spinal stenosis, lumbar region without neurogenic claudication: Secondary | ICD-10-CM | POA: Diagnosis not present

## 2021-11-03 DIAGNOSIS — M5416 Radiculopathy, lumbar region: Secondary | ICD-10-CM | POA: Diagnosis not present

## 2021-11-24 DIAGNOSIS — M5416 Radiculopathy, lumbar region: Secondary | ICD-10-CM | POA: Diagnosis not present

## 2021-11-24 DIAGNOSIS — M5417 Radiculopathy, lumbosacral region: Secondary | ICD-10-CM | POA: Diagnosis not present

## 2021-12-01 DIAGNOSIS — M5416 Radiculopathy, lumbar region: Secondary | ICD-10-CM | POA: Diagnosis not present

## 2021-12-08 ENCOUNTER — Ambulatory Visit (INDEPENDENT_AMBULATORY_CARE_PROVIDER_SITE_OTHER): Payer: BC Managed Care – PPO | Admitting: Family Medicine

## 2021-12-08 ENCOUNTER — Encounter: Payer: Self-pay | Admitting: Family Medicine

## 2021-12-08 VITALS — BP 145/80 | HR 74 | Temp 97.5°F | Wt 279.8 lb

## 2021-12-08 DIAGNOSIS — Z1211 Encounter for screening for malignant neoplasm of colon: Secondary | ICD-10-CM

## 2021-12-08 DIAGNOSIS — Z0000 Encounter for general adult medical examination without abnormal findings: Secondary | ICD-10-CM | POA: Diagnosis not present

## 2021-12-08 DIAGNOSIS — Z136 Encounter for screening for cardiovascular disorders: Secondary | ICD-10-CM | POA: Diagnosis not present

## 2021-12-08 DIAGNOSIS — M5416 Radiculopathy, lumbar region: Secondary | ICD-10-CM

## 2021-12-08 LAB — URINALYSIS, ROUTINE W REFLEX MICROSCOPIC
Bilirubin, UA: NEGATIVE
Glucose, UA: NEGATIVE
Ketones, UA: NEGATIVE
Leukocytes,UA: NEGATIVE
Nitrite, UA: NEGATIVE
Protein,UA: NEGATIVE
RBC, UA: NEGATIVE
Specific Gravity, UA: 1.015 (ref 1.005–1.030)
Urobilinogen, Ur: 0.2 mg/dL (ref 0.2–1.0)
pH, UA: 8 — ABNORMAL HIGH (ref 5.0–7.5)

## 2021-12-08 MED ORDER — PREGABALIN 75 MG PO CAPS: ORAL_CAPSULE | ORAL | 2 refills | Status: DC

## 2021-12-08 NOTE — Progress Notes (Signed)
? ?BP (!) 145/80   Pulse 74   Temp (!) 97.5 ?F (36.4 ?C)   Wt 279 lb 12.8 oz (126.9 kg)   SpO2 99%   BMI 37.49 kg/m?   ? ?Subjective:  ? ? Patient ID: Fred Tran, male    DOB: Apr 10, 1966, 56 y.o.   MRN: [id number] ? ?HPI: ?Fred Tran is a 56 y.o. male presenting on 12/08/2021 for comprehensive medical examination. Current medical complaints include: ? ?BACK PAIN ?Duration: 2 months ?Mechanism of injury: no trauma ?Location: Left and low back ?Onset: sudden ?Severity: severe ?Quality: shooting, aching ?Frequency: constant ?Radiation: L leg and low back ?Aggravating factors: lifting, movement, walking, laying, bending, and prolonged sitting ?Alleviating factors: nothing ?Status: worse ?Treatments attempted: rest, ice, heat, APAP, ibuprofen, aleve, physical therapy, and HEP  ?Relief with NSAIDs?: no ?Nighttime pain:  yes ?Paresthesias / decreased sensation:  yes ?Bowel / bladder incontinence:  no ?Fevers:  no ?Dysuria / urinary frequency:  no ? ?He currently lives with: wife and kids ?Interim Problems from his last visit: no ? ?Depression Screen done today and results listed below:  ? ?  12/08/2021  ?  1:38 PM 07/05/2021  ?  8:56 AM 03/17/2021  ?  9:44 AM 11/09/2020  ?  8:59 AM 08/02/2020  ?  1:27 PM  ?Depression screen PHQ 2/9  ?Decreased Interest 1 0 0 0 0  ?Down, Depressed, Hopeless 0 0 0 0 0  ?PHQ - 2 Score 1 0 0 0 0  ?Altered sleeping 0 0     ?Tired, decreased energy 1 1     ?Change in appetite 0 0     ?Feeling bad or failure about yourself  0 0     ?Trouble concentrating 0 0     ?Moving slowly or fidgety/restless 0 0     ?Suicidal thoughts 0 0     ?PHQ-9 Score 2 1     ?Difficult doing work/chores  Not difficult at all     ? ? ?Past Medical History:  ?Past Medical History:  ?Diagnosis Date  ? Arthritis   ? Hyperlipidemia   ? Obesity (BMI 35.0-39.9 without comorbidity)   ? Tobacco abuse   ? ? ?Surgical History:  ?Past Surgical History:  ?Procedure Laterality Date  ? FINGER SURGERY Left   ? ring finger   ? KNEE SURGERY Right   ? ? ?Medications:  ?Current Outpatient Medications on File Prior to Visit  ?Medication Sig  ? fexofenadine (ALLEGRA ALLERGY) 180 MG tablet Take 1 tablet (180 mg total) by mouth daily.  ? ?No current facility-administered medications on file prior to visit.  ? ? ?Allergies:  ?No Known Allergies ? ?Social History:  ?Social History  ? ?Socioeconomic History  ? Marital status: Married  ?  Spouse name: Not on file  ? Number of children: Not on file  ? Years of education: Not on file  ? Highest education level: Not on file  ?Occupational History  ? Not on file  ?Tobacco Use  ? Smoking status: Every Day  ?  Packs/day: 1.00  ?  Years: 35.00  ?  Pack years: 35.00  ?  Types: Cigarettes  ? Smokeless tobacco: Never  ?Vaping Use  ? Vaping Use: Never used  ?Substance and Sexual Activity  ? Alcohol use: No  ? Drug use: No  ? Sexual activity: Yes  ?Other Topics Concern  ? Not on file  ?Social History Narrative  ? Not on file  ? ?Social Determinants of  Health  ? ?Financial Resource Strain: Not on file  ?Food Insecurity: Not on file  ?Transportation Needs: Not on file  ?Physical Activity: Not on file  ?Stress: Not on file  ?Social Connections: Not on file  ?Intimate Partner Violence: Not on file  ? ?Social History  ? ?Tobacco Use  ?Smoking Status Every Day  ? Packs/day: 1.00  ? Years: 35.00  ? Pack years: 35.00  ? Types: Cigarettes  ?Smokeless Tobacco Never  ? ?Social History  ? ?Substance and Sexual Activity  ?Alcohol Use No  ? ? ?Family History:  ?Family History  ?Problem Relation Age of Onset  ? Arthritis Mother   ? Hyperlipidemia Mother   ? Hypertension Mother   ? Mental illness Mother   ? Thyroid disease Mother   ? Alcohol abuse Father   ? Drug abuse Father   ? ? ?Past medical history, surgical history, medications, allergies, family history and social history reviewed with patient today and changes made to appropriate areas of the chart.  ? ?Review of Systems  ?Constitutional: Negative.   ?HENT:  Negative.    ?Eyes: Negative.   ?Respiratory: Negative.    ?Cardiovascular: Negative.   ?Gastrointestinal:  Positive for heartburn. Negative for abdominal pain, blood in stool, constipation, diarrhea, melena, nausea and vomiting.  ?Genitourinary: Negative.   ?Musculoskeletal:  Positive for back pain and myalgias. Negative for falls, joint pain and neck pain.  ?Skin: Negative.   ?Neurological:  Positive for tingling. Negative for dizziness, tremors, sensory change, speech change, focal weakness, seizures, loss of consciousness, weakness and headaches.  ?Endo/Heme/Allergies: Negative.   ?Psychiatric/Behavioral: Negative.    ?All other ROS negative except what is listed above and in the HPI.  ? ?   ?Objective:  ?  ?BP (!) 145/80   Pulse 74   Temp (!) 97.5 ?F (36.4 ?C)   Wt 279 lb 12.8 oz (126.9 kg)   SpO2 99%   BMI 37.49 kg/m?   ?Wt Readings from Last 3 Encounters:  ?12/08/21 279 lb 12.8 oz (126.9 kg)  ?07/05/21 269 lb (122 kg)  ?03/17/21 278 lb 6.4 oz (126.3 kg)  ?  ?Physical Exam ?Vitals and nursing note reviewed.  ?Constitutional:   ?   General: He is not in acute distress. ?   Appearance: Normal appearance. He is obese. He is not ill-appearing, toxic-appearing or diaphoretic.  ?HENT:  ?   Head: Normocephalic and atraumatic.  ?   Right Ear: Tympanic membrane, ear canal and external ear normal. There is no impacted cerumen.  ?   Left Ear: Tympanic membrane, ear canal and external ear normal. There is no impacted cerumen.  ?   Nose: Nose normal. No congestion or rhinorrhea.  ?   Mouth/Throat:  ?   Mouth: Mucous membranes are moist.  ?   Pharynx: Oropharynx is clear. No oropharyngeal exudate or posterior oropharyngeal erythema.  ?Eyes:  ?   General: No scleral icterus.    ?   Right eye: No discharge.     ?   Left eye: No discharge.  ?   Extraocular Movements: Extraocular movements intact.  ?   Conjunctiva/sclera: Conjunctivae normal.  ?   Pupils: Pupils are equal, round, and reactive to light.  ?Neck:  ?    Vascular: No carotid bruit.  ?Cardiovascular:  ?   Rate and Rhythm: Normal rate and regular rhythm.  ?   Pulses: Normal pulses.  ?   Heart sounds: No murmur heard. ?  No friction rub. No gallop.  ?  Pulmonary:  ?   Effort: Pulmonary effort is normal. No respiratory distress.  ?   Breath sounds: Normal breath sounds. No stridor. No wheezing, rhonchi or rales.  ?Chest:  ?   Chest wall: No tenderness.  ?Abdominal:  ?   General: Abdomen is flat. Bowel sounds are normal. There is no distension.  ?   Palpations: Abdomen is soft. There is no mass.  ?   Tenderness: There is no abdominal tenderness. There is no right CVA tenderness, left CVA tenderness, guarding or rebound.  ?   Hernia: No hernia is present.  ?Genitourinary: ?   Comments: Genital exam deferred with shared decision making ?Musculoskeletal:     ?   General: Tenderness present. No swelling, deformity or signs of injury.  ?   Cervical back: Normal range of motion and neck supple. No rigidity. No muscular tenderness.  ?   Right lower leg: No edema.  ?   Left lower leg: No edema.  ?Lymphadenopathy:  ?   Cervical: No cervical adenopathy.  ?Skin: ?   General: Skin is warm and dry.  ?   Capillary Refill: Capillary refill takes less than 2 seconds.  ?   Coloration: Skin is not jaundiced or pale.  ?   Findings: No bruising, erythema, lesion or rash.  ?Neurological:  ?   General: No focal deficit present.  ?   Mental Status: He is alert and oriented to person, place, and time.  ?   Cranial Nerves: No cranial nerve deficit.  ?   Sensory: No sensory deficit.  ?   Motor: No weakness.  ?   Coordination: Coordination normal.  ?   Gait: Gait normal.  ?   Deep Tendon Reflexes: Reflexes normal.  ?Psychiatric:     ?   Mood and Affect: Mood normal.     ?   Behavior: Behavior normal.     ?   Thought Content: Thought content normal.     ?   Judgment: Judgment normal.  ? ? ?Results for orders placed or performed in visit on 07/05/21  ?CBC with Differential/Platelet  ?Result Value Ref  Range  ? WBC 8.1 3.4 - 10.8 x10E3/uL  ? RBC 4.81 4.14 - 5.80 x10E6/uL  ? Hemoglobin 15.2 13.0 - 17.7 g/dL  ? Hematocrit 45.5 37.5 - 51.0 %  ? MCV 95 79 - 97 fL  ? MCH 31.6 26.6 - 33.0 pg  ? MCHC 33.4 31.5 - 35.7 g/dL  ? RDW 12

## 2021-12-08 NOTE — Assessment & Plan Note (Signed)
S/p surgery with worsened symptoms. Has failed gabapentin and PT. Seeing neurosurgery on Tuesday. Will keep him out of work until they decide what they want to do. Will start lyrica. Rx sent to his pharmacy. Recheck 1 month. Call with any concerns.  ?

## 2021-12-09 LAB — COMPREHENSIVE METABOLIC PANEL
ALT: 20 IU/L (ref 0–44)
AST: 21 IU/L (ref 0–40)
Albumin/Globulin Ratio: 1.6 (ref 1.2–2.2)
Albumin: 4.2 g/dL (ref 3.8–4.9)
Alkaline Phosphatase: 95 IU/L (ref 44–121)
BUN/Creatinine Ratio: 12 (ref 9–20)
BUN: 9 mg/dL (ref 6–24)
Bilirubin Total: <0.2 mg/dL (ref 0.0–1.2)
CO2: 22 mmol/L (ref 20–29)
Calcium: 9.4 mg/dL (ref 8.7–10.2)
Chloride: 103 mmol/L (ref 96–106)
Creatinine, Ser: 0.78 mg/dL (ref 0.76–1.27)
Globulin, Total: 2.6 g/dL (ref 1.5–4.5)
Glucose: 106 mg/dL — ABNORMAL HIGH (ref 70–99)
Potassium: 4.5 mmol/L (ref 3.5–5.2)
Sodium: 140 mmol/L (ref 134–144)
Total Protein: 6.8 g/dL (ref 6.0–8.5)
eGFR: 105 mL/min/{1.73_m2} (ref >59)

## 2021-12-09 LAB — LIPID PANEL W/O CHOL/HDL RATIO
Cholesterol, Total: 233 mg/dL — ABNORMAL HIGH (ref 100–199)
HDL: 43 mg/dL (ref >39)
LDL Chol Calc (NIH): 161 mg/dL — ABNORMAL HIGH (ref 0–99)
Triglycerides: 156 mg/dL — ABNORMAL HIGH (ref 0–149)
VLDL Cholesterol Cal: 29 mg/dL (ref 5–40)

## 2021-12-09 LAB — CBC WITH DIFFERENTIAL/PLATELET
Basophils Absolute: 0 10*3/uL (ref 0.0–0.2)
Basos: 0 %
EOS (ABSOLUTE): 0.3 10*3/uL (ref 0.0–0.4)
Eos: 3 %
Hematocrit: 45.3 % (ref 37.5–51.0)
Hemoglobin: 15.4 g/dL (ref 13.0–17.7)
Immature Grans (Abs): 0 10*3/uL (ref 0.0–0.1)
Immature Granulocytes: 0 %
Lymphocytes Absolute: 2.7 10*3/uL (ref 0.7–3.1)
Lymphs: 29 %
MCH: 31.9 pg (ref 26.6–33.0)
MCHC: 34 g/dL (ref 31.5–35.7)
MCV: 94 fL (ref 79–97)
Monocytes Absolute: 0.7 10*3/uL (ref 0.1–0.9)
Monocytes: 8 %
Neutrophils Absolute: 5.7 10*3/uL (ref 1.4–7.0)
Neutrophils: 60 %
Platelets: 294 10*3/uL (ref 150–450)
RBC: 4.83 x10E6/uL (ref 4.14–5.80)
RDW: 13.2 % (ref 11.6–15.4)
WBC: 9.5 10*3/uL (ref 3.4–10.8)

## 2021-12-09 LAB — TSH: TSH: 2.13 u[IU]/mL (ref 0.450–4.500)

## 2021-12-09 LAB — PSA: Prostate Specific Ag, Serum: 0.8 ng/mL (ref 0.0–4.0)

## 2021-12-12 DIAGNOSIS — M5416 Radiculopathy, lumbar region: Secondary | ICD-10-CM | POA: Diagnosis not present

## 2021-12-12 DIAGNOSIS — Z6838 Body mass index (BMI) 38.0-38.9, adult: Secondary | ICD-10-CM | POA: Diagnosis not present

## 2021-12-17 IMAGING — CR DG FOOT COMPLETE 3+V*R*
3 series · 3 of 3 positions shown · non-contrast
Comparison: None.

CLINICAL DATA: Foot injury and pain.

EXAM:
RIGHT FOOT COMPLETE - 3+ VIEW

[foot ap]
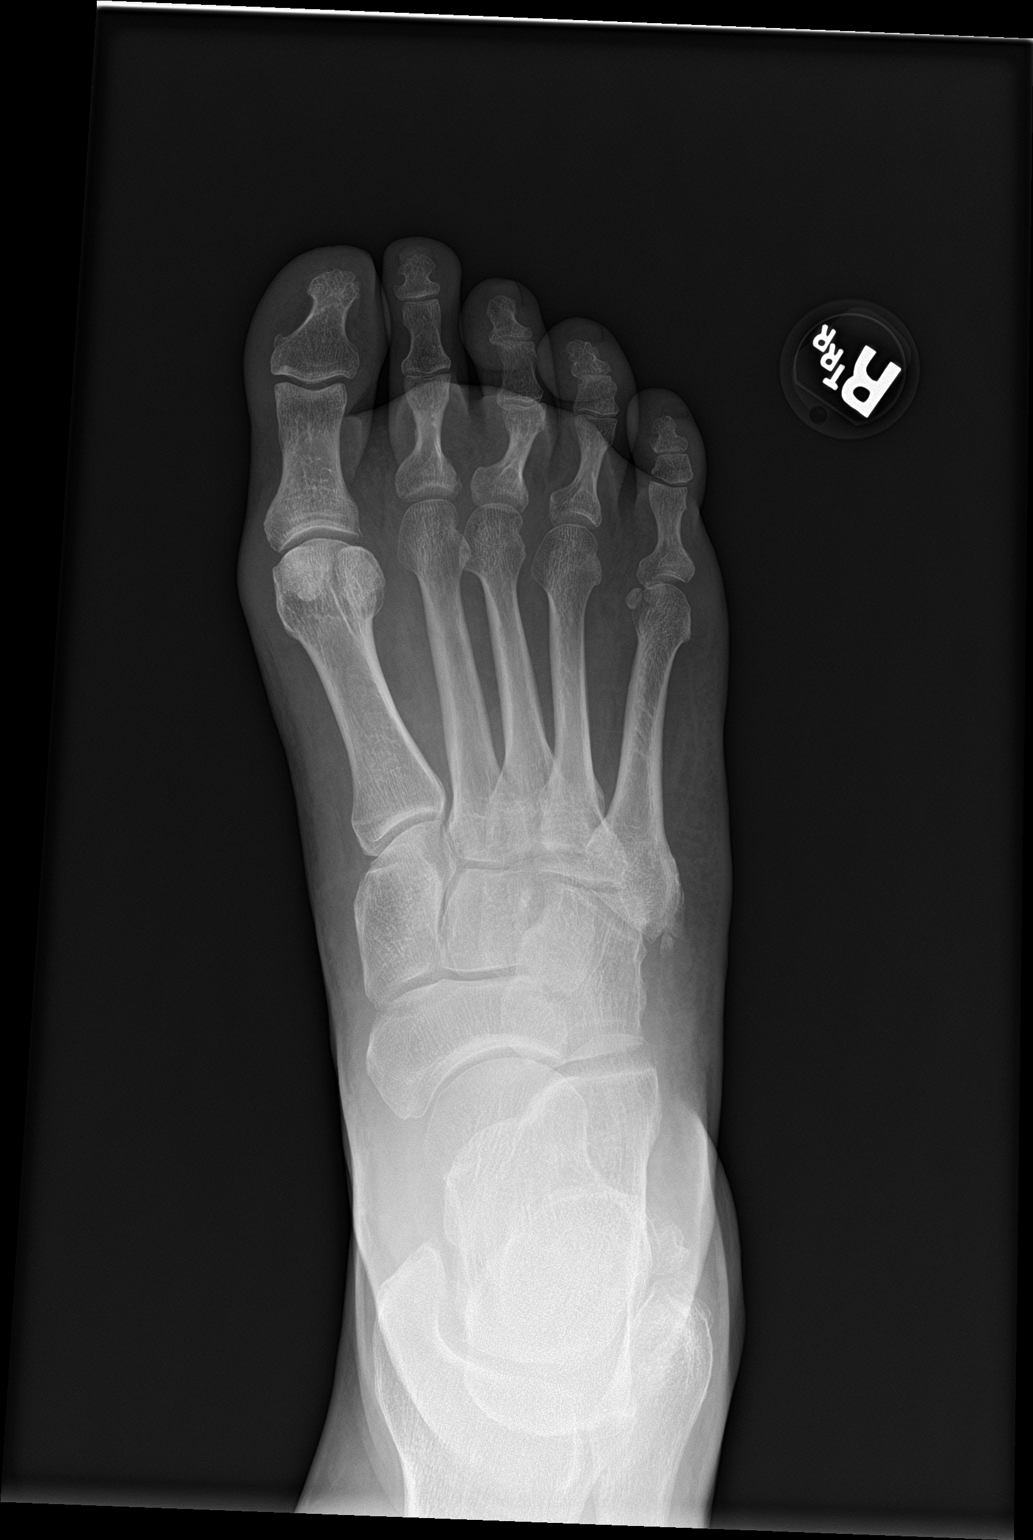

[foot obl]
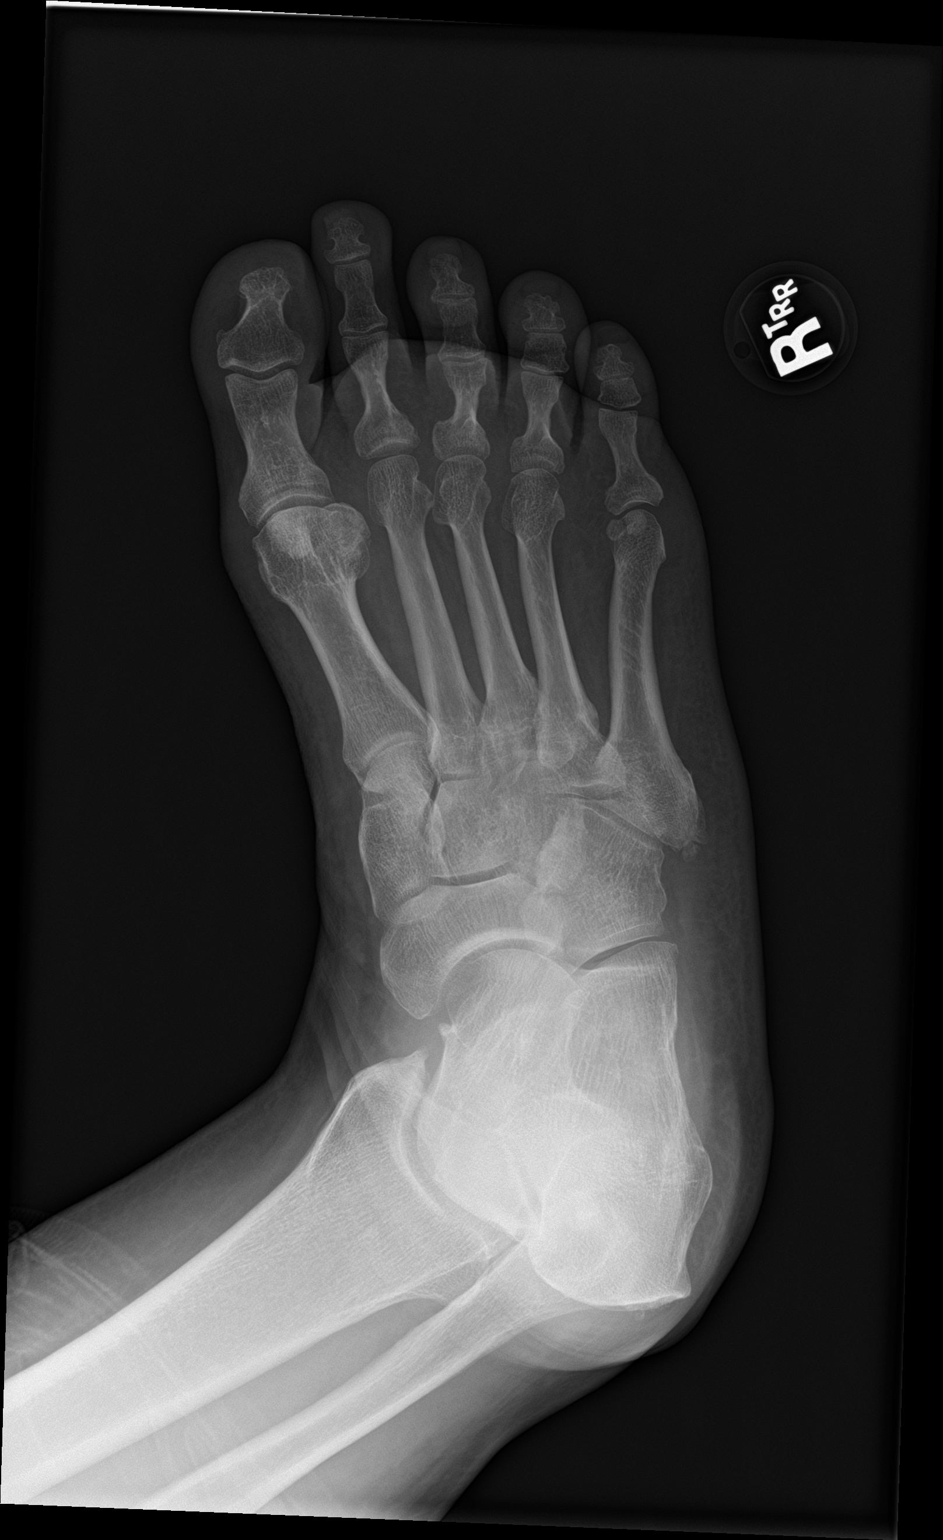

[foot lat]
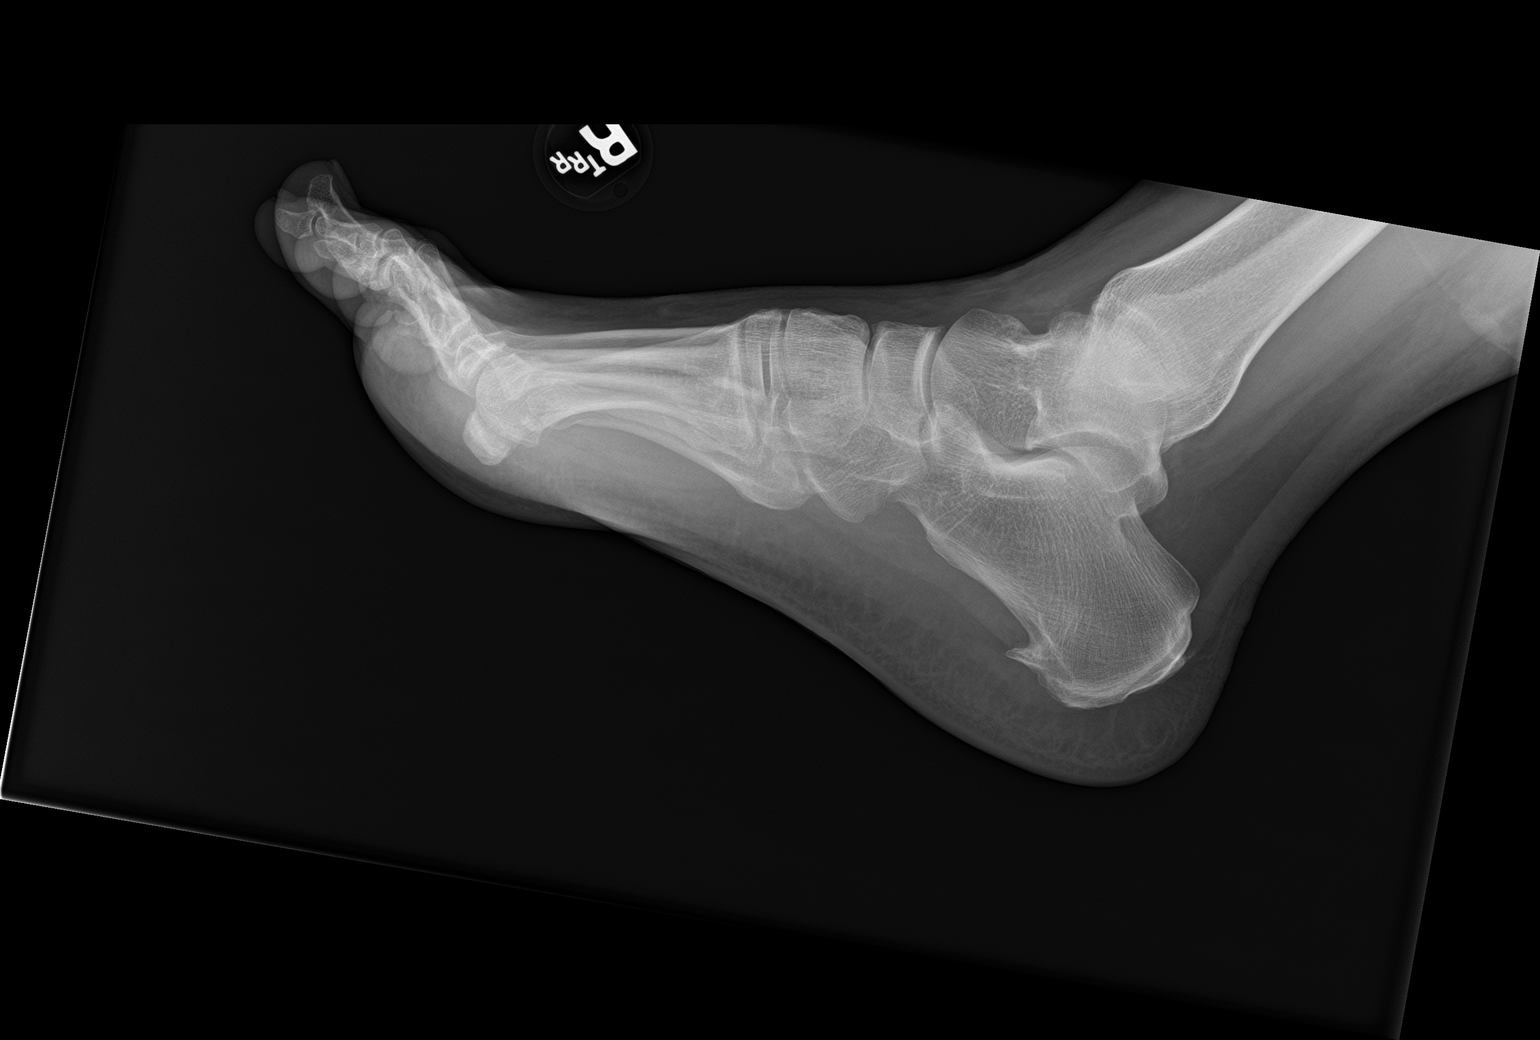

[3 of 3 positions shown; findings below may reference images not displayed]

FINDINGS: There is no evidence of fracture or dislocation. Incidental plantar
heel spur. Sub fibular ossicle and mild spurring about the ankle.
IMPRESSION: No acute fracture.

## 2021-12-20 ENCOUNTER — Encounter: Payer: Self-pay | Admitting: Family Medicine

## 2021-12-22 ENCOUNTER — Encounter: Payer: Self-pay | Admitting: Family Medicine

## 2021-12-22 ENCOUNTER — Telehealth: Payer: Self-pay | Admitting: Family Medicine

## 2021-12-22 NOTE — Telephone Encounter (Signed)
Patient made aware of results and verbalized understanding.  

## 2021-12-22 NOTE — Telephone Encounter (Signed)
This has already been done. It;s on his mychart.  ?

## 2021-12-22 NOTE — Telephone Encounter (Signed)
Pt is calling to ask can Dr. Laural Benes extend his work not until Jan 29, 2022. Afterwards the surgeon can take over. Pt has appt scheduled for follow up. ?CB- [phone] ?

## 2021-12-25 ENCOUNTER — Ambulatory Visit: Payer: BC Managed Care – PPO | Admitting: Family Medicine

## 2021-12-25 ENCOUNTER — Encounter: Payer: Self-pay | Admitting: Family Medicine

## 2021-12-25 VITALS — BP 122/70 | HR 81 | Temp 97.7°F | Wt 283.0 lb

## 2021-12-25 DIAGNOSIS — J069 Acute upper respiratory infection, unspecified: Secondary | ICD-10-CM | POA: Diagnosis not present

## 2021-12-25 MED ORDER — PREDNISONE 50 MG PO TABS
50.0000 mg | ORAL_TABLET | Freq: Every day | ORAL | 0 refills | Status: DC
Start: 2021-12-25 — End: 2022-01-04

## 2021-12-25 NOTE — Progress Notes (Signed)
? ?BP 122/70   Pulse 81   Temp 97.7 ?F (36.5 ?C)   Wt 283 lb (128.4 kg)   SpO2 98%   BMI 37.92 kg/m?   ? ?Subjective:  ? ? Patient ID: Fred Tran, male    DOB: [birth year], 56 y.o.   MRN: [id number] ? ?HPI: ?Fred Tran is a 56 y.o. male ? ?Chief Complaint  ?Patient presents with  ? URI  ?  Patient states he has been coughing for a week, and runny nose at times. Patient states he has facial pressure. Patient has not tried any medication otc.   ? ?UPPER RESPIRATORY TRACT INFECTION ?Duration: 1 week ?Worst symptom: congestion ?Fever: no ?Cough: no ?Shortness of breath: no ?Wheezing: no ?Chest pain: no ?Chest tightness: no ?Chest congestion: no ?Nasal congestion: yes ?Runny nose: yes ?Post nasal drip: yes ?Sneezing: yes ?Sore throat: yes ?Swollen glands: no ?Sinus pressure: yes ?Headache: no ?Face pain: no ?Toothache: no ?Ear pain: no  ?Ear pressure: no  ?Eyes red/itching:no ?Eye drainage/crusting: no  ?Vomiting: no ?Rash: no ?Fatigue: no ?Sick contacts: no ?Strep contacts: no  ?Context: better ?Recurrent sinusitis: no ?Relief with OTC cold/cough medications: no  ?Treatments attempted: none  ? ?Relevant past medical, surgical, family and social history reviewed and updated as indicated. Interim medical history since our last visit reviewed. ?Allergies and medications reviewed and updated. ? ?Review of Systems  ?Constitutional: Negative.   ?HENT:  Positive for congestion, postnasal drip, sinus pressure and sore throat. Negative for dental problem, drooling, ear discharge, ear pain, facial swelling, hearing loss, mouth sores, nosebleeds, rhinorrhea, sinus pain, sneezing, tinnitus, trouble swallowing and voice change.   ?Respiratory: Negative.    ?Cardiovascular: Negative.   ?Psychiatric/Behavioral: Negative.    ? ?Per HPI unless specifically indicated above ? ?   ?Objective:  ?  ?BP 122/70   Pulse 81   Temp 97.7 ?F (36.5 ?C)   Wt 283 lb (128.4 kg)   SpO2 98%   BMI 37.92 kg/m?   ?Wt Readings from Last  3 Encounters:  ?12/25/21 283 lb (128.4 kg)  ?12/08/21 279 lb 12.8 oz (126.9 kg)  ?07/05/21 269 lb (122 kg)  ?  ?Physical Exam ?Vitals and nursing note reviewed.  ?Constitutional:   ?   General: He is not in acute distress. ?   Appearance: Normal appearance. He is obese. He is not ill-appearing, toxic-appearing or diaphoretic.  ?HENT:  ?   Head: Normocephalic and atraumatic.  ?   Right Ear: Tympanic membrane, ear canal and external ear normal.  ?   Left Ear: Tympanic membrane, ear canal and external ear normal.  ?   Nose: Rhinorrhea present. No congestion.  ?   Mouth/Throat:  ?   Mouth: Mucous membranes are moist.  ?   Pharynx: Oropharynx is clear. No oropharyngeal exudate or posterior oropharyngeal erythema.  ?Eyes:  ?   General: No scleral icterus.    ?   Right eye: No discharge.     ?   Left eye: No discharge.  ?   Extraocular Movements: Extraocular movements intact.  ?   Conjunctiva/sclera: Conjunctivae normal.  ?   Pupils: Pupils are equal, round, and reactive to light.  ?Cardiovascular:  ?   Rate and Rhythm: Normal rate and regular rhythm.  ?   Pulses: Normal pulses.  ?   Heart sounds: Normal heart sounds. No murmur heard. ?  No friction rub. No gallop.  ?Pulmonary:  ?   Effort: Pulmonary effort is normal. No respiratory  distress.  ?   Breath sounds: Normal breath sounds. No stridor. No wheezing, rhonchi or rales.  ?Chest:  ?   Chest wall: No tenderness.  ?Musculoskeletal:     ?   General: Normal range of motion.  ?   Cervical back: Normal range of motion and neck supple.  ?Skin: ?   General: Skin is warm and dry.  ?   Capillary Refill: Capillary refill takes less than 2 seconds.  ?   Coloration: Skin is not jaundiced or pale.  ?   Findings: No bruising, erythema, lesion or rash.  ?Neurological:  ?   General: No focal deficit present.  ?   Mental Status: He is alert and oriented to person, place, and time. Mental status is at baseline.  ?Psychiatric:     ?   Mood and Affect: Mood normal.     ?   Behavior:  Behavior normal.     ?   Thought Content: Thought content normal.     ?   Judgment: Judgment normal.  ? ? ?Results for orders placed or performed in visit on 12/08/21  ?Comprehensive metabolic panel  ?Result Value Ref Range  ? Glucose 106 (H) 70 - 99 mg/dL  ? BUN 9 6 - 24 mg/dL  ? Creatinine, Ser 0.78 0.76 - 1.27 mg/dL  ? eGFR 105 >59 mL/min/1.73  ? BUN/Creatinine Ratio 12 9 - 20  ? Sodium 140 134 - 144 mmol/L  ? Potassium 4.5 3.5 - 5.2 mmol/L  ? Chloride 103 96 - 106 mmol/L  ? CO2 22 20 - 29 mmol/L  ? Calcium 9.4 8.7 - 10.2 mg/dL  ? Total Protein 6.8 6.0 - 8.5 g/dL  ? Albumin 4.2 3.8 - 4.9 g/dL  ? Globulin, Total 2.6 1.5 - 4.5 g/dL  ? Albumin/Globulin Ratio 1.6 1.2 - 2.2  ? Bilirubin Total <0.2 0.0 - 1.2 mg/dL  ? Alkaline Phosphatase 95 44 - 121 IU/L  ? AST 21 0 - 40 IU/L  ? ALT 20 0 - 44 IU/L  ?CBC with Differential/Platelet  ?Result Value Ref Range  ? WBC 9.5 3.4 - 10.8 x10E3/uL  ? RBC 4.83 4.14 - 5.80 x10E6/uL  ? Hemoglobin 15.4 13.0 - 17.7 g/dL  ? Hematocrit 45.3 37.5 - 51.0 %  ? MCV 94 79 - 97 fL  ? MCH 31.9 26.6 - 33.0 pg  ? MCHC 34.0 31.5 - 35.7 g/dL  ? RDW 13.2 11.6 - 15.4 %  ? Platelets 294 150 - 450 x10E3/uL  ? Neutrophils 60 Not Estab. %  ? Lymphs 29 Not Estab. %  ? Monocytes 8 Not Estab. %  ? Eos 3 Not Estab. %  ? Basos 0 Not Estab. %  ? Neutrophils Absolute 5.7 1.4 - 7.0 x10E3/uL  ? Lymphocytes Absolute 2.7 0.7 - 3.1 x10E3/uL  ? Monocytes Absolute 0.7 0.1 - 0.9 x10E3/uL  ? EOS (ABSOLUTE) 0.3 0.0 - 0.4 x10E3/uL  ? Basophils Absolute 0.0 0.0 - 0.2 x10E3/uL  ? Immature Granulocytes 0 Not Estab. %  ? Immature Grans (Abs) 0.0 0.0 - 0.1 x10E3/uL  ?Lipid Panel w/o Chol/HDL Ratio  ?Result Value Ref Range  ? Cholesterol, Total 233 (H) 100 - 199 mg/dL  ? Triglycerides 156 (H) 0 - 149 mg/dL  ? HDL 43 >39 mg/dL  ? VLDL Cholesterol Cal 29 5 - 40 mg/dL  ? LDL Chol Calc (NIH) 161 (H) 0 - 99 mg/dL  ?PSA  ?Result Value Ref Range  ? Prostate Specific Ag, Serum 0.8  0.0 - 4.0 ng/mL  ?TSH  ?Result Value Ref Range  ? TSH  2.130 0.450 - 4.500 uIU/mL  ?Urinalysis, Routine w reflex microscopic  ?Result Value Ref Range  ? Specific Gravity, UA 1.015 1.005 - 1.030  ? pH, UA 8.0 (H) 5.0 - 7.5  ? Color, UA Yellow Yellow  ? Appearance Ur Clear Clear  ? Leukocytes,UA Negative Negative  ? Protein,UA Negative Negative/Trace  ? Glucose, UA Negative Negative  ? Ketones, UA Negative Negative  ? RBC, UA Negative Negative  ? Bilirubin, UA Negative Negative  ? Urobilinogen, Ur 0.2 0.2 - 1.0 mg/dL  ? Nitrite, UA Negative Negative  ? ?   ?Assessment & Plan:  ? ?Problem List Items Addressed This Visit   ?None ?Visit Diagnoses   ? ? Upper respiratory tract infection, unspecified type    -  Primary  ? Will treat with prednisone burst. Call with any concerns or if not getting better. Continue to monitor.   ? ?  ?  ? ?Follow up plan: ?Return if symptoms worsen or fail to improve. ? ? ? ? ? ?

## 2022-01-01 ENCOUNTER — Telehealth: Payer: Self-pay | Admitting: Family Medicine

## 2022-01-01 NOTE — Telephone Encounter (Signed)
Patient brought in Short term disability paperwork to be completed by provider. He has an appointment on 4/27. Paperwork was placed in the provider folder for completion. ?

## 2022-01-04 ENCOUNTER — Encounter: Payer: Self-pay | Admitting: Family Medicine

## 2022-01-04 ENCOUNTER — Ambulatory Visit: Payer: BC Managed Care – PPO | Admitting: Family Medicine

## 2022-01-04 VITALS — BP 116/65 | HR 67 | Temp 97.9°F | Wt 281.2 lb

## 2022-01-04 DIAGNOSIS — M5136 Other intervertebral disc degeneration, lumbar region: Secondary | ICD-10-CM

## 2022-01-04 NOTE — Patient Instructions (Signed)
1 tab bid for 2-3 days, then 1 tab daily for 2-3 days then stop ?

## 2022-01-04 NOTE — Progress Notes (Signed)
? ?BP 116/65 (BP Location: Left Arm, Cuff Size: Large)   Pulse 67   Temp 97.9 ?F (36.6 ?C) (Oral)   Wt 281 lb 3.2 oz (127.6 kg)   SpO2 98%   BMI 37.67 kg/m?   ? ?Subjective:  ? ? Patient ID: Fred Tran, male    DOB: [birth year], 56 y.o.   MRN: [id number] ? ?HPI: ?Fred Tran is a 56 y.o. male ? ?Chief Complaint  ?Patient presents with  ? lumbar radiculopathy  ? ?BACK PAIN ?Duration: 3 months ?Mechanism of injury: no trauma ?Location: left and low back ?Onset: sudden ?Severity: 8/10 ?Quality: shooting and aching ?Frequency: constant ?Radiation: L leg and low back ?Aggravating factors: lifting, movement, walking, laying, bending, and prolonged sitting ?Alleviating factors: nothing ?Status: stable ?Treatments attempted: rest, ice, heat, APAP, ibuprofen, aleve, physical therapy, and HEP  ?Relief with NSAIDs?: no ?Nighttime pain:  yes ?Paresthesias / decreased sensation:  yes ?Bowel / bladder incontinence:  no ?Fevers:  no ?Dysuria / urinary frequency:  no ? ?Relevant past medical, surgical, family and social history reviewed and updated as indicated. Interim medical history since our last visit reviewed. ?Allergies and medications reviewed and updated. ? ?Review of Systems  ?Constitutional: Negative.   ?Respiratory: Negative.    ?Cardiovascular: Negative.   ?Musculoskeletal:  Positive for back pain and myalgias. Negative for arthralgias, gait problem, joint swelling, neck pain and neck stiffness.  ?Skin: Negative.   ?Neurological:  Positive for weakness and numbness. Negative for dizziness, tremors, seizures, syncope, facial asymmetry, speech difficulty, light-headedness and headaches.  ?Psychiatric/Behavioral: Negative.    ? ?Per HPI unless specifically indicated above ? ?   ?Objective:  ?  ?BP 116/65 (BP Location: Left Arm, Cuff Size: Large)   Pulse 67   Temp 97.9 ?F (36.6 ?C) (Oral)   Wt 281 lb 3.2 oz (127.6 kg)   SpO2 98%   BMI 37.67 kg/m?   ?Wt Readings from Last 3 Encounters:  ?01/04/22 281 lb  3.2 oz (127.6 kg)  ?12/25/21 283 lb (128.4 kg)  ?12/08/21 279 lb 12.8 oz (126.9 kg)  ?  ?Physical Exam ?Vitals and nursing note reviewed.  ?Constitutional:   ?   General: He is not in acute distress. ?   Appearance: Normal appearance. He is not ill-appearing, toxic-appearing or diaphoretic.  ?HENT:  ?   Head: Normocephalic and atraumatic.  ?   Right Ear: External ear normal.  ?   Left Ear: External ear normal.  ?   Nose: Nose normal.  ?   Mouth/Throat:  ?   Mouth: Mucous membranes are moist.  ?   Pharynx: Oropharynx is clear.  ?Eyes:  ?   General: No scleral icterus.    ?   Right eye: No discharge.     ?   Left eye: No discharge.  ?   Extraocular Movements: Extraocular movements intact.  ?   Conjunctiva/sclera: Conjunctivae normal.  ?   Pupils: Pupils are equal, round, and reactive to light.  ?Cardiovascular:  ?   Rate and Rhythm: Normal rate and regular rhythm.  ?   Pulses: Normal pulses.  ?   Heart sounds: Normal heart sounds. No murmur heard. ?  No friction rub. No gallop.  ?Pulmonary:  ?   Effort: Pulmonary effort is normal. No respiratory distress.  ?   Breath sounds: Normal breath sounds. No stridor. No wheezing, rhonchi or rales.  ?Chest:  ?   Chest wall: No tenderness.  ?Musculoskeletal:     ?  General: Normal range of motion.  ?   Cervical back: Normal range of motion and neck supple.  ?Skin: ?   General: Skin is warm and dry.  ?   Capillary Refill: Capillary refill takes less than 2 seconds.  ?   Coloration: Skin is not jaundiced or pale.  ?   Findings: No bruising, erythema, lesion or rash.  ?Neurological:  ?   General: No focal deficit present.  ?   Mental Status: He is alert and oriented to person, place, and time. Mental status is at baseline.  ?Psychiatric:     ?   Mood and Affect: Mood normal.     ?   Behavior: Behavior normal.     ?   Thought Content: Thought content normal.     ?   Judgment: Judgment normal.  ? ? ?Results for orders placed or performed in visit on 12/08/21  ?Comprehensive  metabolic panel  ?Result Value Ref Range  ? Glucose 106 (H) 70 - 99 mg/dL  ? BUN 9 6 - 24 mg/dL  ? Creatinine, Ser 0.78 0.76 - 1.27 mg/dL  ? eGFR 105 >59 mL/min/1.73  ? BUN/Creatinine Ratio 12 9 - 20  ? Sodium 140 134 - 144 mmol/L  ? Potassium 4.5 3.5 - 5.2 mmol/L  ? Chloride 103 96 - 106 mmol/L  ? CO2 22 20 - 29 mmol/L  ? Calcium 9.4 8.7 - 10.2 mg/dL  ? Total Protein 6.8 6.0 - 8.5 g/dL  ? Albumin 4.2 3.8 - 4.9 g/dL  ? Globulin, Total 2.6 1.5 - 4.5 g/dL  ? Albumin/Globulin Ratio 1.6 1.2 - 2.2  ? Bilirubin Total <0.2 0.0 - 1.2 mg/dL  ? Alkaline Phosphatase 95 44 - 121 IU/L  ? AST 21 0 - 40 IU/L  ? ALT 20 0 - 44 IU/L  ?CBC with Differential/Platelet  ?Result Value Ref Range  ? WBC 9.5 3.4 - 10.8 x10E3/uL  ? RBC 4.83 4.14 - 5.80 x10E6/uL  ? Hemoglobin 15.4 13.0 - 17.7 g/dL  ? Hematocrit 45.3 37.5 - 51.0 %  ? MCV 94 79 - 97 fL  ? MCH 31.9 26.6 - 33.0 pg  ? MCHC 34.0 31.5 - 35.7 g/dL  ? RDW 13.2 11.6 - 15.4 %  ? Platelets 294 150 - 450 x10E3/uL  ? Neutrophils 60 Not Estab. %  ? Lymphs 29 Not Estab. %  ? Monocytes 8 Not Estab. %  ? Eos 3 Not Estab. %  ? Basos 0 Not Estab. %  ? Neutrophils Absolute 5.7 1.4 - 7.0 x10E3/uL  ? Lymphocytes Absolute 2.7 0.7 - 3.1 x10E3/uL  ? Monocytes Absolute 0.7 0.1 - 0.9 x10E3/uL  ? EOS (ABSOLUTE) 0.3 0.0 - 0.4 x10E3/uL  ? Basophils Absolute 0.0 0.0 - 0.2 x10E3/uL  ? Immature Granulocytes 0 Not Estab. %  ? Immature Grans (Abs) 0.0 0.0 - 0.1 x10E3/uL  ?Lipid Panel w/o Chol/HDL Ratio  ?Result Value Ref Range  ? Cholesterol, Total 233 (H) 100 - 199 mg/dL  ? Triglycerides 156 (H) 0 - 149 mg/dL  ? HDL 43 >39 mg/dL  ? VLDL Cholesterol Cal 29 5 - 40 mg/dL  ? LDL Chol Calc (NIH) 161 (H) 0 - 99 mg/dL  ?PSA  ?Result Value Ref Range  ? Prostate Specific Ag, Serum 0.8 0.0 - 4.0 ng/mL  ?TSH  ?Result Value Ref Range  ? TSH 2.130 0.450 - 4.500 uIU/mL  ?Urinalysis, Routine w reflex microscopic  ?Result Value Ref Range  ? Specific Gravity, UA 1.015  1.005 - 1.030  ? pH, UA 8.0 (H) 5.0 - 7.5  ? Color, UA  Yellow Yellow  ? Appearance Ur Clear Clear  ? Leukocytes,UA Negative Negative  ? Protein,UA Negative Negative/Trace  ? Glucose, UA Negative Negative  ? Ketones, UA Negative Negative  ? RBC, UA Negative Negative  ? Bilirubin, UA Negative Negative  ? Urobilinogen, Ur 0.2 0.2 - 1.0 mg/dL  ? Nitrite, UA Negative Negative  ? ?   ?Assessment & Plan:  ? ?Problem List Items Addressed This Visit   ? ?  ? Musculoskeletal and Integument  ? Other intervertebral disc degeneration, lumbar region - Primary  ?  No significant change on his pain with lyrica. Will stop it. Await surgery. Call with any concerns.  ? ?  ?  ?  ? ?Follow up plan: ?Return if symptoms worsen or fail to improve. ? ?15 minutes spent with patient today ? ? ? ?

## 2022-01-04 NOTE — Assessment & Plan Note (Signed)
No significant change on his pain with lyrica. Will stop it. Await surgery. Call with any concerns.  ?

## 2022-01-05 ENCOUNTER — Ambulatory Visit: Payer: BC Managed Care – PPO | Admitting: Family Medicine

## 2022-01-11 DIAGNOSIS — Z6838 Body mass index (BMI) 38.0-38.9, adult: Secondary | ICD-10-CM | POA: Diagnosis not present

## 2022-01-11 DIAGNOSIS — M5416 Radiculopathy, lumbar region: Secondary | ICD-10-CM | POA: Diagnosis not present

## 2022-01-11 DIAGNOSIS — Q762 Congenital spondylolisthesis: Secondary | ICD-10-CM | POA: Diagnosis not present

## 2022-01-11 DIAGNOSIS — Z9889 Other specified postprocedural states: Secondary | ICD-10-CM | POA: Diagnosis not present

## 2022-01-11 DIAGNOSIS — M5116 Intervertebral disc disorders with radiculopathy, lumbar region: Secondary | ICD-10-CM | POA: Diagnosis not present

## 2022-01-11 DIAGNOSIS — M48062 Spinal stenosis, lumbar region with neurogenic claudication: Secondary | ICD-10-CM | POA: Diagnosis not present

## 2022-01-11 DIAGNOSIS — Z981 Arthrodesis status: Secondary | ICD-10-CM | POA: Diagnosis not present

## 2022-01-11 DIAGNOSIS — M48061 Spinal stenosis, lumbar region without neurogenic claudication: Secondary | ICD-10-CM | POA: Diagnosis not present

## 2022-01-29 DIAGNOSIS — M5116 Intervertebral disc disorders with radiculopathy, lumbar region: Secondary | ICD-10-CM | POA: Diagnosis not present

## 2022-01-29 DIAGNOSIS — Z6838 Body mass index (BMI) 38.0-38.9, adult: Secondary | ICD-10-CM | POA: Diagnosis not present

## 2022-01-29 DIAGNOSIS — Z7982 Long term (current) use of aspirin: Secondary | ICD-10-CM | POA: Diagnosis not present

## 2022-01-29 DIAGNOSIS — Z791 Long term (current) use of non-steroidal anti-inflammatories (NSAID): Secondary | ICD-10-CM | POA: Diagnosis not present

## 2022-01-29 DIAGNOSIS — E669 Obesity, unspecified: Secondary | ICD-10-CM | POA: Diagnosis not present

## 2022-01-29 DIAGNOSIS — M48062 Spinal stenosis, lumbar region with neurogenic claudication: Secondary | ICD-10-CM | POA: Diagnosis not present

## 2022-01-29 DIAGNOSIS — T84296A Other mechanical complication of internal fixation device of vertebrae, initial encounter: Secondary | ICD-10-CM | POA: Diagnosis not present

## 2022-01-29 DIAGNOSIS — Q762 Congenital spondylolisthesis: Secondary | ICD-10-CM | POA: Diagnosis not present

## 2022-01-29 DIAGNOSIS — F17210 Nicotine dependence, cigarettes, uncomplicated: Secondary | ICD-10-CM | POA: Diagnosis not present

## 2022-01-29 DIAGNOSIS — Z981 Arthrodesis status: Secondary | ICD-10-CM | POA: Diagnosis not present

## 2022-01-29 DIAGNOSIS — M5416 Radiculopathy, lumbar region: Secondary | ICD-10-CM | POA: Diagnosis not present

## 2022-01-29 DIAGNOSIS — F17291 Nicotine dependence, other tobacco product, in remission: Secondary | ICD-10-CM | POA: Diagnosis not present

## 2022-01-29 HISTORY — PX: OTHER SURGICAL HISTORY: SHX169

## 2022-03-09 DIAGNOSIS — M5416 Radiculopathy, lumbar region: Secondary | ICD-10-CM | POA: Diagnosis not present

## 2022-03-09 DIAGNOSIS — M48062 Spinal stenosis, lumbar region with neurogenic claudication: Secondary | ICD-10-CM | POA: Diagnosis not present

## 2022-03-15 DIAGNOSIS — M5116 Intervertebral disc disorders with radiculopathy, lumbar region: Secondary | ICD-10-CM | POA: Diagnosis not present

## 2022-03-15 DIAGNOSIS — M5416 Radiculopathy, lumbar region: Secondary | ICD-10-CM | POA: Diagnosis not present

## 2022-03-15 DIAGNOSIS — M25559 Pain in unspecified hip: Secondary | ICD-10-CM | POA: Diagnosis not present

## 2022-03-15 DIAGNOSIS — M5136 Other intervertebral disc degeneration, lumbar region: Secondary | ICD-10-CM | POA: Diagnosis not present

## 2022-03-22 DIAGNOSIS — M25552 Pain in left hip: Secondary | ICD-10-CM | POA: Diagnosis not present

## 2022-03-22 DIAGNOSIS — M5416 Radiculopathy, lumbar region: Secondary | ICD-10-CM | POA: Diagnosis not present

## 2022-03-22 DIAGNOSIS — M1712 Unilateral primary osteoarthritis, left knee: Secondary | ICD-10-CM | POA: Diagnosis not present

## 2022-04-24 ENCOUNTER — Encounter: Payer: Self-pay | Admitting: Family Medicine

## 2022-06-15 ENCOUNTER — Encounter: Payer: Self-pay | Admitting: Family Medicine

## 2022-06-15 ENCOUNTER — Ambulatory Visit (INDEPENDENT_AMBULATORY_CARE_PROVIDER_SITE_OTHER): Payer: BC Managed Care – PPO | Admitting: Family Medicine

## 2022-06-15 VITALS — BP 124/73 | HR 78 | Temp 98.0°F | Wt 286.3 lb

## 2022-06-15 DIAGNOSIS — H6122 Impacted cerumen, left ear: Secondary | ICD-10-CM | POA: Diagnosis not present

## 2022-06-15 DIAGNOSIS — M5416 Radiculopathy, lumbar region: Secondary | ICD-10-CM

## 2022-06-15 MED ORDER — PREGABALIN 75 MG PO CAPS: ORAL_CAPSULE | ORAL | 2 refills | Status: DC

## 2022-06-15 NOTE — Assessment & Plan Note (Signed)
Will follow up with neurosurgeon. Will start lyrica. Call with any concerns. Follow up 1 month.

## 2022-06-15 NOTE — Progress Notes (Signed)
BP 124/73   Pulse 78   Temp 98 F (36.7 C)   Wt 286 lb 4.8 oz (129.9 kg)   SpO2 95%   BMI 38.36 kg/m    Subjective:    Patient ID: Fred Tran, male    DOB: 1966-07-07, 56 y.o.   MRN: [id number]  HPI: Fred Tran is a 56 y.o. male  Chief Complaint  Patient presents with   Pain    Patient states he is here today for ongoing pain in his left hip and leg, pain is now in the lower right leg as well.    Ear Pain    Patient states both his ears are stopped up for about a week, no other symptoms.    EAG CLOGGED Duration: about a week Involved ear(s):  L>R Sensation of feeling clogged/plugged: yes Decreased/muffled hearing:yes Ear pain: no Fever: no Otorrhea: no Hearing loss: yes Upper respiratory infection symptoms: no Using Q-Tips: no Status: stable History of cerumenosis: no Treatments attempted: none  BACK PAIN- never got better after the surgery Duration: chronic Mechanism of injury: no trauma Location: bilateral and low back Onset: gradual Severity: moderate Quality: stabbing Frequency: constant Radiation: L>R leg Aggravating factors:  standing and laying Alleviating factors: nothing Status: stable Treatments attempted: rest, ice, heat, APAP, and ibuprofen  Relief with NSAIDs?: mild Nighttime pain:  yes Paresthesias / decreased sensation:  yes Bowel / bladder incontinence:  no Fevers:  no Dysuria / urinary frequency:  no   Relevant past medical, surgical, family and social history reviewed and updated as indicated. Interim medical history since our last visit reviewed. Allergies and medications reviewed and updated.  Review of Systems  Constitutional: Negative.   Respiratory: Negative.    Cardiovascular: Negative.   Gastrointestinal: Negative.   Musculoskeletal: Negative.   Neurological: Negative.   Psychiatric/Behavioral: Negative.      Per HPI unless specifically indicated above     Objective:    BP 124/73   Pulse 78   Temp  98 F (36.7 C)   Wt 286 lb 4.8 oz (129.9 kg)   SpO2 95%   BMI 38.36 kg/m   Wt Readings from Last 3 Encounters:  06/15/22 286 lb 4.8 oz (129.9 kg)  01/04/22 281 lb 3.2 oz (127.6 kg)  12/25/21 283 lb (128.4 kg)    Physical Exam Vitals and nursing note reviewed.  Constitutional:      General: He is not in acute distress.    Appearance: Normal appearance. He is not ill-appearing, toxic-appearing or diaphoretic.  HENT:     Head: Normocephalic and atraumatic.     Right Ear: External ear normal. There is impacted cerumen.     Left Ear: Tympanic membrane, ear canal and external ear normal.     Nose: Nose normal.     Mouth/Throat:     Mouth: Mucous membranes are moist.     Pharynx: Oropharynx is clear.  Eyes:     General: No scleral icterus.       Right eye: No discharge.        Left eye: No discharge.     Extraocular Movements: Extraocular movements intact.     Conjunctiva/sclera: Conjunctivae normal.     Pupils: Pupils are equal, round, and reactive to light.  Cardiovascular:     Rate and Rhythm: Normal rate and regular rhythm.     Pulses: Normal pulses.     Heart sounds: Normal heart sounds. No murmur heard.    No friction rub. No  gallop.  Pulmonary:     Effort: Pulmonary effort is normal. No respiratory distress.     Breath sounds: Normal breath sounds. No stridor. No wheezing, rhonchi or rales.  Chest:     Chest wall: No tenderness.  Musculoskeletal:        General: Normal range of motion.     Cervical back: Normal range of motion and neck supple.  Skin:    General: Skin is warm and dry.     Capillary Refill: Capillary refill takes less than 2 seconds.     Coloration: Skin is not jaundiced or pale.     Findings: No bruising, erythema, lesion or rash.  Neurological:     General: No focal deficit present.     Mental Status: He is alert and oriented to person, place, and time. Mental status is at baseline.  Psychiatric:        Mood and Affect: Mood normal.         Behavior: Behavior normal.        Thought Content: Thought content normal.        Judgment: Judgment normal.     Results for orders placed or performed in visit on 12/08/21  Comprehensive metabolic panel  Result Value Ref Range   Glucose 106 (H) 70 - 99 mg/dL   BUN 9 6 - 24 mg/dL   Creatinine, Ser 0.78 0.76 - 1.27 mg/dL   eGFR 105 >59 mL/min/1.73   BUN/Creatinine Ratio 12 9 - 20   Sodium 140 134 - 144 mmol/L   Potassium 4.5 3.5 - 5.2 mmol/L   Chloride 103 96 - 106 mmol/L   CO2 22 20 - 29 mmol/L   Calcium 9.4 8.7 - 10.2 mg/dL   Total Protein 6.8 6.0 - 8.5 g/dL   Albumin 4.2 3.8 - 4.9 g/dL   Globulin, Total 2.6 1.5 - 4.5 g/dL   Albumin/Globulin Ratio 1.6 1.2 - 2.2   Bilirubin Total <0.2 0.0 - 1.2 mg/dL   Alkaline Phosphatase 95 44 - 121 IU/L   AST 21 0 - 40 IU/L   ALT 20 0 - 44 IU/L  CBC with Differential/Platelet  Result Value Ref Range   WBC 9.5 3.4 - 10.8 x10E3/uL   RBC 4.83 4.14 - 5.80 x10E6/uL   Hemoglobin 15.4 13.0 - 17.7 g/dL   Hematocrit 45.3 37.5 - 51.0 %   MCV 94 79 - 97 fL   MCH 31.9 26.6 - 33.0 pg   MCHC 34.0 31.5 - 35.7 g/dL   RDW 13.2 11.6 - 15.4 %   Platelets 294 150 - 450 x10E3/uL   Neutrophils 60 Not Estab. %   Lymphs 29 Not Estab. %   Monocytes 8 Not Estab. %   Eos 3 Not Estab. %   Basos 0 Not Estab. %   Neutrophils Absolute 5.7 1.4 - 7.0 x10E3/uL   Lymphocytes Absolute 2.7 0.7 - 3.1 x10E3/uL   Monocytes Absolute 0.7 0.1 - 0.9 x10E3/uL   EOS (ABSOLUTE) 0.3 0.0 - 0.4 x10E3/uL   Basophils Absolute 0.0 0.0 - 0.2 x10E3/uL   Immature Granulocytes 0 Not Estab. %   Immature Grans (Abs) 0.0 0.0 - 0.1 x10E3/uL  Lipid Panel w/o Chol/HDL Ratio  Result Value Ref Range   Cholesterol, Total 233 (H) 100 - 199 mg/dL   Triglycerides 156 (H) 0 - 149 mg/dL   HDL 43 >39 mg/dL   VLDL Cholesterol Cal 29 5 - 40 mg/dL   LDL Chol Calc (NIH) 161 (H) 0 - 99  mg/dL  PSA  Result Value Ref Range   Prostate Specific Ag, Serum 0.8 0.0 - 4.0 ng/mL  TSH  Result Value Ref  Range   TSH 2.130 0.450 - 4.500 uIU/mL  Urinalysis, Routine w reflex microscopic  Result Value Ref Range   Specific Gravity, UA 1.015 1.005 - 1.030   pH, UA 8.0 (H) 5.0 - 7.5   Color, UA Yellow Yellow   Appearance Ur Clear Clear   Leukocytes,UA Negative Negative   Protein,UA Negative Negative/Trace   Glucose, UA Negative Negative   Ketones, UA Negative Negative   RBC, UA Negative Negative   Bilirubin, UA Negative Negative   Urobilinogen, Ur 0.2 0.2 - 1.0 mg/dL   Nitrite, UA Negative Negative      Assessment & Plan:   Problem List Items Addressed This Visit       Nervous and Auditory   Lumbar radiculopathy - Primary    Will follow up with neurosurgeon. Will start lyrica. Call with any concerns. Follow up 1 month.       Relevant Medications   pregabalin (LYRICA) 75 MG capsule   Other Visit Diagnoses     Hearing loss of left ear due to cerumen impaction       Ear flushed today with good results. No infection. Feeling better.         Follow up plan: Return in about 4 weeks (around 07/13/2022).

## 2022-06-21 ENCOUNTER — Ambulatory Visit: Payer: BC Managed Care – PPO | Admitting: Family Medicine

## 2022-07-10 DIAGNOSIS — R200 Anesthesia of skin: Secondary | ICD-10-CM | POA: Diagnosis not present

## 2022-07-10 DIAGNOSIS — Z981 Arthrodesis status: Secondary | ICD-10-CM | POA: Diagnosis not present

## 2022-07-10 DIAGNOSIS — M5116 Intervertebral disc disorders with radiculopathy, lumbar region: Secondary | ICD-10-CM | POA: Diagnosis not present

## 2022-07-10 DIAGNOSIS — M4726 Other spondylosis with radiculopathy, lumbar region: Secondary | ICD-10-CM | POA: Diagnosis not present

## 2022-07-10 DIAGNOSIS — M25559 Pain in unspecified hip: Secondary | ICD-10-CM | POA: Diagnosis not present

## 2022-07-10 DIAGNOSIS — M5416 Radiculopathy, lumbar region: Secondary | ICD-10-CM | POA: Diagnosis not present

## 2022-07-10 DIAGNOSIS — M4326 Fusion of spine, lumbar region: Secondary | ICD-10-CM | POA: Diagnosis not present

## 2022-07-16 ENCOUNTER — Encounter: Payer: Self-pay | Admitting: Family Medicine

## 2022-07-16 ENCOUNTER — Ambulatory Visit (INDEPENDENT_AMBULATORY_CARE_PROVIDER_SITE_OTHER): Payer: BC Managed Care – PPO | Admitting: Family Medicine

## 2022-07-16 VITALS — BP 125/68 | HR 65 | Temp 98.2°F | Wt 288.4 lb

## 2022-07-16 DIAGNOSIS — Z1211 Encounter for screening for malignant neoplasm of colon: Secondary | ICD-10-CM

## 2022-07-16 DIAGNOSIS — M5416 Radiculopathy, lumbar region: Secondary | ICD-10-CM

## 2022-07-16 NOTE — Assessment & Plan Note (Signed)
No better on lyrica- does not want to continue it. Will wean back off and stop. Call with any concerns.

## 2022-07-16 NOTE — Progress Notes (Addendum)
BP 125/68 (BP Location: Right Arm, Cuff Size: Large)   Pulse 65   Temp 98.2 F (36.8 C) (Oral)   Wt 288 lb 6.4 oz (130.8 kg)   SpO2 98%   BMI 38.64 kg/m    Subjective:    Patient ID: Fred Tran, male    DOB: 07-23-1966, 56 y.o.   MRN: [id number]  HPI: Fred Tran is a 56 y.o. male  Chief Complaint  Patient presents with   Back Pain    Pt states he is still having back pain, rating at a 6 today. States he is scheduled for an MRI next Monday for his back.    BACK PAIN Duration:  chronic Location: bilateral and low back Onset: gradual Severity: moderate Quality: stabbing Frequency: constant Radiation: L>R leg Aggravating factors: standing and laying Alleviating factors: nothing Status: stable Treatments attempted: rest, ice, heat, APAP, ibuprofen, aleve, physical therapy, and HEP  Relief with NSAIDs?: mild Nighttime pain:  yes Paresthesias / decreased sensation:  yes Bowel / bladder incontinence:  no Fevers:  no Dysuria / urinary frequency:  no  Relevant past medical, surgical, family and social history reviewed and updated as indicated. Interim medical history since our last visit reviewed. Allergies and medications reviewed and updated.  Review of Systems  Constitutional: Negative.   Respiratory: Negative.    Cardiovascular: Negative.   Musculoskeletal:  Positive for back pain and myalgias. Negative for arthralgias, gait problem, joint swelling, neck pain and neck stiffness.  Neurological:  Positive for weakness and numbness. Negative for dizziness, tremors, seizures, syncope, facial asymmetry, speech difficulty, light-headedness and headaches.  Psychiatric/Behavioral: Negative.      Per HPI unless specifically indicated above     Objective:    BP 125/68 (BP Location: Right Arm, Cuff Size: Large)   Pulse 65   Temp 98.2 F (36.8 C) (Oral)   Wt 288 lb 6.4 oz (130.8 kg)   SpO2 98%   BMI 38.64 kg/m   Wt Readings from Last 3 Encounters:   07/16/22 288 lb 6.4 oz (130.8 kg)  06/15/22 286 lb 4.8 oz (129.9 kg)  01/04/22 281 lb 3.2 oz (127.6 kg)    Physical Exam Vitals and nursing note reviewed.  Constitutional:      General: He is not in acute distress.    Appearance: Normal appearance. He is not ill-appearing, toxic-appearing or diaphoretic.  HENT:     Head: Normocephalic and atraumatic.     Right Ear: External ear normal.     Left Ear: External ear normal.     Nose: Nose normal.     Mouth/Throat:     Mouth: Mucous membranes are moist.     Pharynx: Oropharynx is clear.  Eyes:     General: No scleral icterus.       Right eye: No discharge.        Left eye: No discharge.     Extraocular Movements: Extraocular movements intact.     Conjunctiva/sclera: Conjunctivae normal.     Pupils: Pupils are equal, round, and reactive to light.  Cardiovascular:     Rate and Rhythm: Normal rate and regular rhythm.     Pulses: Normal pulses.     Heart sounds: Normal heart sounds. No murmur heard.    No friction rub. No gallop.  Pulmonary:     Effort: Pulmonary effort is normal. No respiratory distress.     Breath sounds: Normal breath sounds. No stridor. No wheezing, rhonchi or rales.  Chest:  Chest wall: No tenderness.  Musculoskeletal:        General: Normal range of motion.     Cervical back: Normal range of motion and neck supple.  Skin:    General: Skin is warm and dry.     Capillary Refill: Capillary refill takes less than 2 seconds.     Coloration: Skin is not jaundiced or pale.     Findings: No bruising, erythema, lesion or rash.  Neurological:     General: No focal deficit present.     Mental Status: He is alert and oriented to person, place, and time. Mental status is at baseline.  Psychiatric:        Mood and Affect: Mood normal.        Behavior: Behavior normal.        Thought Content: Thought content normal.        Judgment: Judgment normal.     Results for orders placed or performed in visit on  12/08/21  Comprehensive metabolic panel  Result Value Ref Range   Glucose 106 (H) 70 - 99 mg/dL   BUN 9 6 - 24 mg/dL   Creatinine, Ser 0.78 0.76 - 1.27 mg/dL   eGFR 105 >59 mL/min/1.73   BUN/Creatinine Ratio 12 9 - 20   Sodium 140 134 - 144 mmol/L   Potassium 4.5 3.5 - 5.2 mmol/L   Chloride 103 96 - 106 mmol/L   CO2 22 20 - 29 mmol/L   Calcium 9.4 8.7 - 10.2 mg/dL   Total Protein 6.8 6.0 - 8.5 g/dL   Albumin 4.2 3.8 - 4.9 g/dL   Globulin, Total 2.6 1.5 - 4.5 g/dL   Albumin/Globulin Ratio 1.6 1.2 - 2.2   Bilirubin Total <0.2 0.0 - 1.2 mg/dL   Alkaline Phosphatase 95 44 - 121 IU/L   AST 21 0 - 40 IU/L   ALT 20 0 - 44 IU/L  CBC with Differential/Platelet  Result Value Ref Range   WBC 9.5 3.4 - 10.8 x10E3/uL   RBC 4.83 4.14 - 5.80 x10E6/uL   Hemoglobin 15.4 13.0 - 17.7 g/dL   Hematocrit 45.3 37.5 - 51.0 %   MCV 94 79 - 97 fL   MCH 31.9 26.6 - 33.0 pg   MCHC 34.0 31.5 - 35.7 g/dL   RDW 13.2 11.6 - 15.4 %   Platelets 294 150 - 450 x10E3/uL   Neutrophils 60 Not Estab. %   Lymphs 29 Not Estab. %   Monocytes 8 Not Estab. %   Eos 3 Not Estab. %   Basos 0 Not Estab. %   Neutrophils Absolute 5.7 1.4 - 7.0 x10E3/uL   Lymphocytes Absolute 2.7 0.7 - 3.1 x10E3/uL   Monocytes Absolute 0.7 0.1 - 0.9 x10E3/uL   EOS (ABSOLUTE) 0.3 0.0 - 0.4 x10E3/uL   Basophils Absolute 0.0 0.0 - 0.2 x10E3/uL   Immature Granulocytes 0 Not Estab. %   Immature Grans (Abs) 0.0 0.0 - 0.1 x10E3/uL  Lipid Panel w/o Chol/HDL Ratio  Result Value Ref Range   Cholesterol, Total 233 (H) 100 - 199 mg/dL   Triglycerides 156 (H) 0 - 149 mg/dL   HDL 43 >39 mg/dL   VLDL Cholesterol Cal 29 5 - 40 mg/dL   LDL Chol Calc (NIH) 161 (H) 0 - 99 mg/dL  PSA  Result Value Ref Range   Prostate Specific Ag, Serum 0.8 0.0 - 4.0 ng/mL  TSH  Result Value Ref Range   TSH 2.130 0.450 - 4.500 uIU/mL  Urinalysis, Routine  w reflex microscopic  Result Value Ref Range   Specific Gravity, UA 1.015 1.005 - 1.030   pH, UA 8.0 (H) 5.0  - 7.5   Color, UA Yellow Yellow   Appearance Ur Clear Clear   Leukocytes,UA Negative Negative   Protein,UA Negative Negative/Trace   Glucose, UA Negative Negative   Ketones, UA Negative Negative   RBC, UA Negative Negative   Bilirubin, UA Negative Negative   Urobilinogen, Ur 0.2 0.2 - 1.0 mg/dL   Nitrite, UA Negative Negative      Assessment & Plan:   Problem List Items Addressed This Visit       Nervous and Auditory   Lumbar radiculopathy - Primary    No better on lyrica- does not want to continue it. Will wean back off and stop. Call with any concerns.       Other Visit Diagnoses     Screening for colon cancer       Relevant Orders   Cologuard        Follow up plan: Return after 12/09/22 for physical.

## 2022-07-16 NOTE — Addendum Note (Signed)
Addended by: Valerie Roys on: 07/16/2022 11:09 AM   Modules accepted: Orders

## 2022-07-23 DIAGNOSIS — M5114 Intervertebral disc disorders with radiculopathy, thoracic region: Secondary | ICD-10-CM | POA: Diagnosis not present

## 2022-07-23 DIAGNOSIS — M5416 Radiculopathy, lumbar region: Secondary | ICD-10-CM | POA: Diagnosis not present

## 2022-07-23 DIAGNOSIS — M4726 Other spondylosis with radiculopathy, lumbar region: Secondary | ICD-10-CM | POA: Diagnosis not present

## 2022-07-23 DIAGNOSIS — M4326 Fusion of spine, lumbar region: Secondary | ICD-10-CM | POA: Diagnosis not present

## 2022-07-23 DIAGNOSIS — M4722 Other spondylosis with radiculopathy, cervical region: Secondary | ICD-10-CM | POA: Diagnosis not present

## 2022-10-05 DIAGNOSIS — M5416 Radiculopathy, lumbar region: Secondary | ICD-10-CM | POA: Diagnosis not present

## 2022-10-05 DIAGNOSIS — M961 Postlaminectomy syndrome, not elsewhere classified: Secondary | ICD-10-CM | POA: Diagnosis not present

## 2022-10-08 DIAGNOSIS — M5416 Radiculopathy, lumbar region: Secondary | ICD-10-CM | POA: Diagnosis not present

## 2022-10-09 DIAGNOSIS — M5416 Radiculopathy, lumbar region: Secondary | ICD-10-CM | POA: Diagnosis not present

## 2022-10-10 DIAGNOSIS — M5416 Radiculopathy, lumbar region: Secondary | ICD-10-CM | POA: Diagnosis not present

## 2022-10-11 DIAGNOSIS — M5416 Radiculopathy, lumbar region: Secondary | ICD-10-CM | POA: Diagnosis not present

## 2022-10-16 ENCOUNTER — Encounter: Payer: Self-pay | Admitting: Family Medicine

## 2022-10-16 ENCOUNTER — Ambulatory Visit (INDEPENDENT_AMBULATORY_CARE_PROVIDER_SITE_OTHER): Payer: BC Managed Care – PPO | Admitting: Family Medicine

## 2022-10-16 VITALS — BP 144/74 | HR 76 | Temp 97.5°F | Wt 294.0 lb

## 2022-10-16 DIAGNOSIS — M5416 Radiculopathy, lumbar region: Secondary | ICD-10-CM

## 2022-10-16 MED ORDER — KETOROLAC TROMETHAMINE 60 MG/2ML IM SOLN
60.0000 mg | Freq: Once | INTRAMUSCULAR | Status: AC
  Administered 2022-10-16: 60 mg via INTRAMUSCULAR

## 2022-10-16 NOTE — Progress Notes (Signed)
BP (!) 144/74   Pulse 76   Temp (!) 97.5 F (36.4 C) (Oral)   Wt 294 lb (133.4 kg)   SpO2 96%   BMI 39.39 kg/m    Subjective:    Patient ID: Fred Tran, male    DOB: [birth year], 57 y.o.   MRN: [id number]  HPI: Fred REIERSON is a 57 y.o. male  Chief Complaint  Patient presents with   Back Pain   BACK PAIN- has seen neurosurgery- they did not recommend another surgery for his DJD. They advised him getting hooked up with pain management. Restarted on lyrica and started on cymbalta. Considering injections. He has fallen a couple of times. Duration:  chronic Mechanism of injury: no trauma Location: bilateral and low back Onset: gradual Severity: moderate Quality: shooting, burning, heavy Frequency: intermittent Radiation: down L leg Aggravating factors: walking Alleviating factors: nothing Status: worse Treatments attempted: lyrica, surgery, cymbalta  Relief with NSAIDs?: no Nighttime pain:  yes Paresthesias / decreased sensation:  yes Bowel / bladder incontinence:  no Fevers:  no Dysuria / urinary frequency:  no  Relevant past medical, surgical, family and social history reviewed and updated as indicated. Interim medical history since our last visit reviewed. Allergies and medications reviewed and updated.  Review of Systems  Constitutional: Negative.   Respiratory: Negative.    Cardiovascular: Negative.   Gastrointestinal: Negative.   Musculoskeletal:  Positive for back pain and myalgias. Negative for arthralgias, gait problem, joint swelling, neck pain and neck stiffness.  Skin: Negative.   Neurological: Negative.   Psychiatric/Behavioral: Negative.      Per HPI unless specifically indicated above     Objective:    BP (!) 144/74   Pulse 76   Temp (!) 97.5 F (36.4 C) (Oral)   Wt 294 lb (133.4 kg)   SpO2 96%   BMI 39.39 kg/m   Wt Readings from Last 3 Encounters:  10/16/22 294 lb (133.4 kg)  07/16/22 288 lb 6.4 oz (130.8 kg)  06/15/22 286  lb 4.8 oz (129.9 kg)    Physical Exam Vitals and nursing note reviewed.  Constitutional:      General: He is not in acute distress.    Appearance: Normal appearance. He is obese. He is not ill-appearing, toxic-appearing or diaphoretic.  HENT:     Head: Normocephalic and atraumatic.     Right Ear: External ear normal.     Left Ear: External ear normal.     Nose: Nose normal.     Mouth/Throat:     Mouth: Mucous membranes are moist.     Pharynx: Oropharynx is clear.  Eyes:     General: No scleral icterus.       Right eye: No discharge.        Left eye: No discharge.     Extraocular Movements: Extraocular movements intact.     Conjunctiva/sclera: Conjunctivae normal.     Pupils: Pupils are equal, round, and reactive to light.  Cardiovascular:     Rate and Rhythm: Normal rate and regular rhythm.     Pulses: Normal pulses.     Heart sounds: Normal heart sounds. No murmur heard.    No friction rub. No gallop.  Pulmonary:     Effort: Pulmonary effort is normal. No respiratory distress.     Breath sounds: Normal breath sounds. No stridor. No wheezing, rhonchi or rales.  Chest:     Chest wall: No tenderness.  Musculoskeletal:        General:  Normal range of motion.     Cervical back: Normal range of motion and neck supple.  Skin:    General: Skin is warm and dry.     Capillary Refill: Capillary refill takes less than 2 seconds.     Coloration: Skin is not jaundiced or pale.     Findings: No bruising, erythema, lesion or rash.  Neurological:     General: No focal deficit present.     Mental Status: He is alert and oriented to person, place, and time. Mental status is at baseline.  Psychiatric:        Mood and Affect: Mood normal.        Behavior: Behavior normal.        Thought Content: Thought content normal.        Judgment: Judgment normal.     Results for orders placed or performed in visit on 12/08/21  Comprehensive metabolic panel  Result Value Ref Range   Glucose 106  (H) 70 - 99 mg/dL   BUN 9 6 - 24 mg/dL   Creatinine, Ser 0.78 0.76 - 1.27 mg/dL   eGFR 105 >59 mL/min/1.73   BUN/Creatinine Ratio 12 9 - 20   Sodium 140 134 - 144 mmol/L   Potassium 4.5 3.5 - 5.2 mmol/L   Chloride 103 96 - 106 mmol/L   CO2 22 20 - 29 mmol/L   Calcium 9.4 8.7 - 10.2 mg/dL   Total Protein 6.8 6.0 - 8.5 g/dL   Albumin 4.2 3.8 - 4.9 g/dL   Globulin, Total 2.6 1.5 - 4.5 g/dL   Albumin/Globulin Ratio 1.6 1.2 - 2.2   Bilirubin Total <0.2 0.0 - 1.2 mg/dL   Alkaline Phosphatase 95 44 - 121 IU/L   AST 21 0 - 40 IU/L   ALT 20 0 - 44 IU/L  CBC with Differential/Platelet  Result Value Ref Range   WBC 9.5 3.4 - 10.8 x10E3/uL   RBC 4.83 4.14 - 5.80 x10E6/uL   Hemoglobin 15.4 13.0 - 17.7 g/dL   Hematocrit 45.3 37.5 - 51.0 %   MCV 94 79 - 97 fL   MCH 31.9 26.6 - 33.0 pg   MCHC 34.0 31.5 - 35.7 g/dL   RDW 13.2 11.6 - 15.4 %   Platelets 294 150 - 450 x10E3/uL   Neutrophils 60 Not Estab. %   Lymphs 29 Not Estab. %   Monocytes 8 Not Estab. %   Eos 3 Not Estab. %   Basos 0 Not Estab. %   Neutrophils Absolute 5.7 1.4 - 7.0 x10E3/uL   Lymphocytes Absolute 2.7 0.7 - 3.1 x10E3/uL   Monocytes Absolute 0.7 0.1 - 0.9 x10E3/uL   EOS (ABSOLUTE) 0.3 0.0 - 0.4 x10E3/uL   Basophils Absolute 0.0 0.0 - 0.2 x10E3/uL   Immature Granulocytes 0 Not Estab. %   Immature Grans (Abs) 0.0 0.0 - 0.1 x10E3/uL  Lipid Panel w/o Chol/HDL Ratio  Result Value Ref Range   Cholesterol, Total 233 (H) 100 - 199 mg/dL   Triglycerides 156 (H) 0 - 149 mg/dL   HDL 43 >39 mg/dL   VLDL Cholesterol Cal 29 5 - 40 mg/dL   LDL Chol Calc (NIH) 161 (H) 0 - 99 mg/dL  PSA  Result Value Ref Range   Prostate Specific Ag, Serum 0.8 0.0 - 4.0 ng/mL  TSH  Result Value Ref Range   TSH 2.130 0.450 - 4.500 uIU/mL  Urinalysis, Routine w reflex microscopic  Result Value Ref Range   Specific Gravity, UA 1.015  1.005 - 1.030   pH, UA 8.0 (H) 5.0 - 7.5   Color, UA Yellow Yellow   Appearance Ur Clear Clear   Leukocytes,UA  Negative Negative   Protein,UA Negative Negative/Trace   Glucose, UA Negative Negative   Ketones, UA Negative Negative   RBC, UA Negative Negative   Bilirubin, UA Negative Negative   Urobilinogen, Ur 0.2 0.2 - 1.0 mg/dL   Nitrite, UA Negative Negative      Assessment & Plan:   Problem List Items Addressed This Visit       Nervous and Auditory   Lumbar radiculopathy - Primary    Following with pain management and titrating up on his medicine. Will get him into physical therapy and keep him out of work while he does that. Referral generated today. Follow up 1 month. Will fill out FMLA paperwork when it comes through.       Relevant Medications   DULoxetine (CYMBALTA) 30 MG capsule   pregabalin (LYRICA) 100 MG capsule   Other Relevant Orders   Ambulatory referral to Physical Therapy     Follow up plan: Return in about 4 weeks (around 11/13/2022).

## 2022-10-16 NOTE — Assessment & Plan Note (Signed)
Following with pain management and titrating up on his medicine. Will get him into physical therapy and keep him out of work while he does that. Referral generated today. Follow up 1 month. Will fill out FMLA paperwork when it comes through.

## 2022-11-08 ENCOUNTER — Telehealth: Payer: Self-pay

## 2022-11-08 NOTE — Telephone Encounter (Signed)
Copied from [place] [id number]. Topic: General - Other >> Nov 07, 2022 10:41 AM Everette C wrote: Reason for CRM: The patient has called to follow up on previously submitted paperwork from [facility] related to their disability   The patient has been told that a corrected date of 10/16/22 as well as documentation stating the extend of the patient's disability is needed for their paperwork   Please contact the patient further when possible

## 2022-11-09 DIAGNOSIS — M5416 Radiculopathy, lumbar region: Secondary | ICD-10-CM | POA: Diagnosis not present

## 2022-11-10 DIAGNOSIS — M5416 Radiculopathy, lumbar region: Secondary | ICD-10-CM | POA: Diagnosis not present

## 2022-11-12 NOTE — Telephone Encounter (Signed)
Pt called to see if his paperwork was faxed to [facility] / please advise

## 2022-11-13 ENCOUNTER — Ambulatory Visit (INDEPENDENT_AMBULATORY_CARE_PROVIDER_SITE_OTHER): Payer: BC Managed Care – PPO | Admitting: Family Medicine

## 2022-11-13 ENCOUNTER — Encounter: Payer: Self-pay | Admitting: Family Medicine

## 2022-11-13 VITALS — BP 144/76 | HR 77 | Temp 97.9°F | Wt 292.1 lb

## 2022-11-13 DIAGNOSIS — M5416 Radiculopathy, lumbar region: Secondary | ICD-10-CM

## 2022-11-13 DIAGNOSIS — Z1211 Encounter for screening for malignant neoplasm of colon: Secondary | ICD-10-CM

## 2022-11-13 DIAGNOSIS — R0981 Nasal congestion: Secondary | ICD-10-CM | POA: Diagnosis not present

## 2022-11-13 DIAGNOSIS — Z23 Encounter for immunization: Secondary | ICD-10-CM | POA: Diagnosis not present

## 2022-11-13 DIAGNOSIS — F4323 Adjustment disorder with mixed anxiety and depressed mood: Secondary | ICD-10-CM

## 2022-11-13 MED ORDER — PREDNISONE 10 MG PO TABS: ORAL_TABLET | ORAL | 0 refills | Status: DC

## 2022-11-13 NOTE — Assessment & Plan Note (Signed)
Continues to work on titrating up his medication with pain management. Currently taking only 1 of his pregabalin a day. Taking '60mg'$  of cymbalta. Does not feel like he has had much of a changes. Has not started PT yet.

## 2022-11-13 NOTE — Assessment & Plan Note (Signed)
Still not doing well. Has been on cymbalta. Continue to monitor. Call with any concerns.

## 2022-11-13 NOTE — Progress Notes (Signed)
BP (!) 144/76   Pulse 77   Temp 97.9 F (36.6 C) (Oral)   Wt 292 lb 1.6 oz (132.5 kg)   SpO2 96%   BMI 39.14 kg/m    Subjective:    Patient ID: Fred Tran, male    DOB: 1965-11-26, 57 y.o.   MRN: [id number]  HPI: Fred Tran is a 57 y.o. male  Chief Complaint  Patient presents with   Back Pain   Nasal Congestion   BACK PAIN Duration: chronic Mechanism of injury: no trauma Location: bilateral and low back Onset: gradual Severity: severe Quality: aching, shooting, numb Frequency: constant Radiation: down his legs Aggravating factors: ifting, moving, bending Alleviating factors: rest Status: stable Treatments attempted:  surgery, cymbalta, lyrica, rest, ice, heat, APAP, ibuprofen, aleve, and physical therapy  Relief with NSAIDs?: no Nighttime pain:  yes Paresthesias / decreased sensation:  yes Bowel / bladder incontinence:  no Fevers:  no Dysuria / urinary frequency:  no  UPPER RESPIRATORY TRACT INFECTION Duration: 3-4 days Worst symptom: congestion and L ear pain Fever: no Cough: yes Shortness of breath: no Wheezing: no Chest pain: no Chest tightness: no Chest congestion: no Nasal congestion: yes Runny nose: no Post nasal drip: no Sneezing: no Sore throat: no Swollen glands: no Sinus pressure: no Headache: yes Face pain: no Toothache: no Ear pain: no  Ear pressure: yes left Eyes red/itching:no Eye drainage/crusting: yes  Vomiting: no Rash: no Fatigue: yes Sick contacts: no Strep contacts: no  Context: better Recurrent sinusitis: no Relief with OTC cold/cough medications: no  Treatments attempted: none   Relevant past medical, surgical, family and social history reviewed and updated as indicated. Interim medical history since our last visit reviewed. Allergies and medications reviewed and updated.  Review of Systems  Constitutional: Negative.   Respiratory: Negative.    Cardiovascular: Negative.   Gastrointestinal:  Negative.   Musculoskeletal:  Positive for back pain and myalgias. Negative for arthralgias, gait problem, joint swelling, neck pain and neck stiffness.  Skin: Negative.   Neurological:  Positive for weakness and numbness. Negative for dizziness, tremors, seizures, syncope, facial asymmetry, speech difficulty, light-headedness and headaches.  Psychiatric/Behavioral: Negative.      Per HPI unless specifically indicated above     Objective:    BP (!) 144/76   Pulse 77   Temp 97.9 F (36.6 C) (Oral)   Wt 292 lb 1.6 oz (132.5 kg)   SpO2 96%   BMI 39.14 kg/m   Wt Readings from Last 3 Encounters:  11/13/22 292 lb 1.6 oz (132.5 kg)  10/16/22 294 lb (133.4 kg)  07/16/22 288 lb 6.4 oz (130.8 kg)    Physical Exam Vitals and nursing note reviewed.  Constitutional:      General: He is not in acute distress.    Appearance: Normal appearance. He is obese. He is not ill-appearing, toxic-appearing or diaphoretic.  HENT:     Head: Normocephalic and atraumatic.     Right Ear: External ear normal.     Left Ear: External ear normal.     Nose: Nose normal.     Mouth/Throat:     Mouth: Mucous membranes are moist.     Pharynx: Oropharynx is clear.  Eyes:     General: No scleral icterus.       Right eye: No discharge.        Left eye: No discharge.     Extraocular Movements: Extraocular movements intact.     Conjunctiva/sclera: Conjunctivae normal.  Pupils: Pupils are equal, round, and reactive to light.  Cardiovascular:     Rate and Rhythm: Normal rate and regular rhythm.     Pulses: Normal pulses.     Heart sounds: Normal heart sounds. No murmur heard.    No friction rub. No gallop.  Pulmonary:     Effort: Pulmonary effort is normal. No respiratory distress.     Breath sounds: Normal breath sounds. No stridor. No wheezing, rhonchi or rales.  Chest:     Chest wall: No tenderness.  Musculoskeletal:        General: Normal range of motion.     Cervical back: Normal range of motion  and neck supple.  Skin:    General: Skin is warm and dry.     Capillary Refill: Capillary refill takes less than 2 seconds.     Coloration: Skin is not jaundiced or pale.     Findings: No bruising, erythema, lesion or rash.  Neurological:     General: No focal deficit present.     Mental Status: He is alert and oriented to person, place, and time. Mental status is at baseline.  Psychiatric:        Mood and Affect: Mood normal.        Behavior: Behavior normal.        Thought Content: Thought content normal.        Judgment: Judgment normal.     Results for orders placed or performed in visit on 12/08/21  Comprehensive metabolic panel  Result Value Ref Range   Glucose 106 (H) 70 - 99 mg/dL   BUN 9 6 - 24 mg/dL   Creatinine, Ser 0.78 0.76 - 1.27 mg/dL   eGFR 105 >59 mL/min/1.73   BUN/Creatinine Ratio 12 9 - 20   Sodium 140 134 - 144 mmol/L   Potassium 4.5 3.5 - 5.2 mmol/L   Chloride 103 96 - 106 mmol/L   CO2 22 20 - 29 mmol/L   Calcium 9.4 8.7 - 10.2 mg/dL   Total Protein 6.8 6.0 - 8.5 g/dL   Albumin 4.2 3.8 - 4.9 g/dL   Globulin, Total 2.6 1.5 - 4.5 g/dL   Albumin/Globulin Ratio 1.6 1.2 - 2.2   Bilirubin Total <0.2 0.0 - 1.2 mg/dL   Alkaline Phosphatase 95 44 - 121 IU/L   AST 21 0 - 40 IU/L   ALT 20 0 - 44 IU/L  CBC with Differential/Platelet  Result Value Ref Range   WBC 9.5 3.4 - 10.8 x10E3/uL   RBC 4.83 4.14 - 5.80 x10E6/uL   Hemoglobin 15.4 13.0 - 17.7 g/dL   Hematocrit 45.3 37.5 - 51.0 %   MCV 94 79 - 97 fL   MCH 31.9 26.6 - 33.0 pg   MCHC 34.0 31.5 - 35.7 g/dL   RDW 13.2 11.6 - 15.4 %   Platelets 294 150 - 450 x10E3/uL   Neutrophils 60 Not Estab. %   Lymphs 29 Not Estab. %   Monocytes 8 Not Estab. %   Eos 3 Not Estab. %   Basos 0 Not Estab. %   Neutrophils Absolute 5.7 1.4 - 7.0 x10E3/uL   Lymphocytes Absolute 2.7 0.7 - 3.1 x10E3/uL   Monocytes Absolute 0.7 0.1 - 0.9 x10E3/uL   EOS (ABSOLUTE) 0.3 0.0 - 0.4 x10E3/uL   Basophils Absolute 0.0 0.0 - 0.2  x10E3/uL   Immature Granulocytes 0 Not Estab. %   Immature Grans (Abs) 0.0 0.0 - 0.1 x10E3/uL  Lipid Panel w/o Chol/HDL Ratio  Result Value Ref Range   Cholesterol, Total 233 (H) 100 - 199 mg/dL   Triglycerides 156 (H) 0 - 149 mg/dL   HDL 43 >39 mg/dL   VLDL Cholesterol Cal 29 5 - 40 mg/dL   LDL Chol Calc (NIH) 161 (H) 0 - 99 mg/dL  PSA  Result Value Ref Range   Prostate Specific Ag, Serum 0.8 0.0 - 4.0 ng/mL  TSH  Result Value Ref Range   TSH 2.130 0.450 - 4.500 uIU/mL  Urinalysis, Routine w reflex microscopic  Result Value Ref Range   Specific Gravity, UA 1.015 1.005 - 1.030   pH, UA 8.0 (H) 5.0 - 7.5   Color, UA Yellow Yellow   Appearance Ur Clear Clear   Leukocytes,UA Negative Negative   Protein,UA Negative Negative/Trace   Glucose, UA Negative Negative   Ketones, UA Negative Negative   RBC, UA Negative Negative   Bilirubin, UA Negative Negative   Urobilinogen, Ur 0.2 0.2 - 1.0 mg/dL   Nitrite, UA Negative Negative      Assessment & Plan:   Problem List Items Addressed This Visit       Nervous and Auditory   Lumbar radiculopathy - Primary    Continues to work on titrating up his medication with pain management. Currently taking only 1 of his pregabalin a day. Taking '60mg'$  of cymbalta. Does not feel like he has had much of a changes. Has not started PT yet.       Relevant Orders   Ambulatory referral to Physical Therapy     Other   Adjustment disorder with mixed anxiety and depressed mood    Still not doing well. Has been on cymbalta. Continue to monitor. Call with any concerns.       Other Visit Diagnoses     Nasal congestion       Will treat with prednisone taper. Call with any concerns.   Screening for colon cancer       Cologuard ordered today.   Relevant Orders   Cologuard        Follow up plan: Return in about 4 weeks (around 12/11/2022).

## 2022-11-26 ENCOUNTER — Encounter: Payer: Self-pay | Admitting: Family Medicine

## 2022-11-26 ENCOUNTER — Ambulatory Visit: Payer: BC Managed Care – PPO | Admitting: Family Medicine

## 2022-11-26 VITALS — BP 150/73 | HR 85 | Temp 97.6°F | Wt 294.1 lb

## 2022-11-26 DIAGNOSIS — M5136 Other intervertebral disc degeneration, lumbar region: Secondary | ICD-10-CM

## 2022-11-26 NOTE — Assessment & Plan Note (Signed)
Still has not gotten in to see pain management or PT. We will hold on sending him back to work until after he sees pain management. Will keep him out until after he sees pain management. Will fill out FMLA when it comes in. Call with any concerns.

## 2022-11-26 NOTE — Progress Notes (Signed)
BP (!) 150/73 (BP Location: Left Arm, Cuff Size: Normal)   Pulse 85   Temp 97.6 F (36.4 C) (Oral)   Wt 294 lb 1.6 oz (133.4 kg)   SpO2 97%   BMI 39.40 kg/m    Subjective:    Patient ID: Fred Tran, male    DOB: 10/25/1965, 57 y.o.   MRN: [id number]  HPI: Fred Tran is a 57 y.o. male  Chief Complaint  Patient presents with   Back Pain   BACK PAIN- Seeing his pain management 12/07/22. Has not started PT. Not due to start until the end of April Taking 2 of the cymbalta, 1 of the lyrica a day. Not noticing any changes. Duration: chronic Mechanism of injury: no trauma Location: bilateral and low back Onset: gradual Severity: severe Quality: aching, shooting, numb Frequency: constant Radiation: down his legs Aggravating factors: lifting, moving, bending Alleviating factors: rest Status: stable Treatments attempted: surgery, cymbalta, lyrica, ice, heat, APAP, ibuprofen, aleve, PT  Relief with NSAIDs?: no Nighttime pain:  yes Paresthesias / decreased sensation:  yes Bowel / bladder incontinence:  no Fevers:  no Dysuria / urinary frequency:  no   Relevant past medical, surgical, family and social history reviewed and updated as indicated. Interim medical history since our last visit reviewed. Allergies and medications reviewed and updated.  Review of Systems  Constitutional: Negative.   Respiratory: Negative.    Cardiovascular: Negative.   Musculoskeletal: Negative.   Psychiatric/Behavioral: Negative.      Per HPI unless specifically indicated above     Objective:    BP (!) 150/73 (BP Location: Left Arm, Cuff Size: Normal)   Pulse 85   Temp 97.6 F (36.4 C) (Oral)   Wt 294 lb 1.6 oz (133.4 kg)   SpO2 97%   BMI 39.40 kg/m   Wt Readings from Last 3 Encounters:  11/26/22 294 lb 1.6 oz (133.4 kg)  11/13/22 292 lb 1.6 oz (132.5 kg)  10/16/22 294 lb (133.4 kg)    Physical Exam Vitals and nursing note reviewed.  Constitutional:      General: He  is not in acute distress.    Appearance: Normal appearance. He is not ill-appearing, toxic-appearing or diaphoretic.  HENT:     Head: Normocephalic and atraumatic.     Right Ear: External ear normal.     Left Ear: External ear normal.     Nose: Nose normal.     Mouth/Throat:     Mouth: Mucous membranes are moist.     Pharynx: Oropharynx is clear.  Eyes:     General: No scleral icterus.       Right eye: No discharge.        Left eye: No discharge.     Extraocular Movements: Extraocular movements intact.     Conjunctiva/sclera: Conjunctivae normal.     Pupils: Pupils are equal, round, and reactive to light.  Cardiovascular:     Rate and Rhythm: Normal rate and regular rhythm.     Pulses: Normal pulses.     Heart sounds: Normal heart sounds. No murmur heard.    No friction rub. No gallop.  Pulmonary:     Effort: Pulmonary effort is normal. No respiratory distress.     Breath sounds: Normal breath sounds. No stridor. No wheezing, rhonchi or rales.  Chest:     Chest wall: No tenderness.  Musculoskeletal:        General: Normal range of motion.     Cervical back: Normal range  of motion and neck supple.  Skin:    General: Skin is warm and dry.     Capillary Refill: Capillary refill takes less than 2 seconds.     Coloration: Skin is not jaundiced or pale.     Findings: No bruising, erythema, lesion or rash.  Neurological:     General: No focal deficit present.     Mental Status: He is alert and oriented to person, place, and time. Mental status is at baseline.  Psychiatric:        Mood and Affect: Mood normal.        Behavior: Behavior normal.        Thought Content: Thought content normal.        Judgment: Judgment normal.     Results for orders placed or performed in visit on 12/08/21  Comprehensive metabolic panel  Result Value Ref Range   Glucose 106 (H) 70 - 99 mg/dL   BUN 9 6 - 24 mg/dL   Creatinine, Ser 0.78 0.76 - 1.27 mg/dL   eGFR 105 >59 mL/min/1.73    BUN/Creatinine Ratio 12 9 - 20   Sodium 140 134 - 144 mmol/L   Potassium 4.5 3.5 - 5.2 mmol/L   Chloride 103 96 - 106 mmol/L   CO2 22 20 - 29 mmol/L   Calcium 9.4 8.7 - 10.2 mg/dL   Total Protein 6.8 6.0 - 8.5 g/dL   Albumin 4.2 3.8 - 4.9 g/dL   Globulin, Total 2.6 1.5 - 4.5 g/dL   Albumin/Globulin Ratio 1.6 1.2 - 2.2   Bilirubin Total <0.2 0.0 - 1.2 mg/dL   Alkaline Phosphatase 95 44 - 121 IU/L   AST 21 0 - 40 IU/L   ALT 20 0 - 44 IU/L  CBC with Differential/Platelet  Result Value Ref Range   WBC 9.5 3.4 - 10.8 x10E3/uL   RBC 4.83 4.14 - 5.80 x10E6/uL   Hemoglobin 15.4 13.0 - 17.7 g/dL   Hematocrit 45.3 37.5 - 51.0 %   MCV 94 79 - 97 fL   MCH 31.9 26.6 - 33.0 pg   MCHC 34.0 31.5 - 35.7 g/dL   RDW 13.2 11.6 - 15.4 %   Platelets 294 150 - 450 x10E3/uL   Neutrophils 60 Not Estab. %   Lymphs 29 Not Estab. %   Monocytes 8 Not Estab. %   Eos 3 Not Estab. %   Basos 0 Not Estab. %   Neutrophils Absolute 5.7 1.4 - 7.0 x10E3/uL   Lymphocytes Absolute 2.7 0.7 - 3.1 x10E3/uL   Monocytes Absolute 0.7 0.1 - 0.9 x10E3/uL   EOS (ABSOLUTE) 0.3 0.0 - 0.4 x10E3/uL   Basophils Absolute 0.0 0.0 - 0.2 x10E3/uL   Immature Granulocytes 0 Not Estab. %   Immature Grans (Abs) 0.0 0.0 - 0.1 x10E3/uL  Lipid Panel w/o Chol/HDL Ratio  Result Value Ref Range   Cholesterol, Total 233 (H) 100 - 199 mg/dL   Triglycerides 156 (H) 0 - 149 mg/dL   HDL 43 >39 mg/dL   VLDL Cholesterol Cal 29 5 - 40 mg/dL   LDL Chol Calc (NIH) 161 (H) 0 - 99 mg/dL  PSA  Result Value Ref Range   Prostate Specific Ag, Serum 0.8 0.0 - 4.0 ng/mL  TSH  Result Value Ref Range   TSH 2.130 0.450 - 4.500 uIU/mL  Urinalysis, Routine w reflex microscopic  Result Value Ref Range   Specific Gravity, UA 1.015 1.005 - 1.030   pH, UA 8.0 (H) 5.0 - 7.5  Color, UA Yellow Yellow   Appearance Ur Clear Clear   Leukocytes,UA Negative Negative   Protein,UA Negative Negative/Trace   Glucose, UA Negative Negative   Ketones, UA Negative  Negative   RBC, UA Negative Negative   Bilirubin, UA Negative Negative   Urobilinogen, Ur 0.2 0.2 - 1.0 mg/dL   Nitrite, UA Negative Negative      Assessment & Plan:   Problem List Items Addressed This Visit       Musculoskeletal and Integument   Other intervertebral disc degeneration, lumbar region - Primary    Still has not gotten in to see pain management or PT. We will hold on sending him back to work until after he sees pain management. Will keep him out until after he sees pain management. Will fill out FMLA when it comes in. Call with any concerns.         Follow up plan: Return as scheduled.

## 2022-11-28 ENCOUNTER — Telehealth: Payer: Self-pay | Admitting: Family Medicine

## 2022-11-28 NOTE — Telephone Encounter (Signed)
Pt brought in Short-Term Disability Medical Update Form to be filled out by provider.  Put in provider's folder.

## 2022-12-07 DIAGNOSIS — M5416 Radiculopathy, lumbar region: Secondary | ICD-10-CM | POA: Diagnosis not present

## 2022-12-09 DIAGNOSIS — M5416 Radiculopathy, lumbar region: Secondary | ICD-10-CM | POA: Diagnosis not present

## 2022-12-10 DIAGNOSIS — M5416 Radiculopathy, lumbar region: Secondary | ICD-10-CM | POA: Diagnosis not present

## 2022-12-11 ENCOUNTER — Ambulatory Visit: Payer: BC Managed Care – PPO | Admitting: Family Medicine

## 2022-12-11 ENCOUNTER — Encounter: Payer: Self-pay | Admitting: Family Medicine

## 2022-12-11 VITALS — BP 116/75 | HR 83 | Temp 98.1°F | Wt 294.9 lb

## 2022-12-11 DIAGNOSIS — M5136 Other intervertebral disc degeneration, lumbar region: Secondary | ICD-10-CM

## 2022-12-11 NOTE — Progress Notes (Signed)
BP 116/75 (BP Location: Left Arm, Cuff Size: Normal)   Pulse 83   Temp 98.1 F (36.7 C) (Oral)   Wt 294 lb 14.4 oz (133.8 kg)   SpO2 96%   BMI 39.51 kg/m    Subjective:    Patient ID: Fred Tran, male    DOB: 27-Mar-1966, 57 y.o.   MRN: [id number]  HPI: Fred Tran is a 57 y.o. male  Chief Complaint  Patient presents with   Back Pain   BACK PAIN- no changes with his medicine. Saw pain management. He is to start PT at the beginning of May. He is not feeling any better Duration: chronic Mechanism of injury: unknown Location: bilateral low back Onset: gradual Severity: severe Quality: aching, sore and numb Frequency: constant Radiation: down both his legs Aggravating factors: lifting, moving, bending, working Alleviating factors: rest Status: stable Treatments attempted: surgery, cymbalta, lyrica, ice, heat, APAP, ibuprofen, aleve, PT   Relief with NSAIDs?: no Nighttime pain:  yes Paresthesias / decreased sensation:  yes Bowel / bladder incontinence:  no Fevers:  no Dysuria / urinary frequency:  no  Relevant past medical, surgical, family and social history reviewed and updated as indicated. Interim medical history since our last visit reviewed. Allergies and medications reviewed and updated.  Review of Systems  Constitutional: Negative.   Respiratory: Negative.    Cardiovascular: Negative.   Gastrointestinal: Negative.   Musculoskeletal:  Positive for back pain and myalgias. Negative for arthralgias, gait problem and joint swelling.    Per HPI unless specifically indicated above     Objective:    BP 116/75 (BP Location: Left Arm, Cuff Size: Normal)   Pulse 83   Temp 98.1 F (36.7 C) (Oral)   Wt 294 lb 14.4 oz (133.8 kg)   SpO2 96%   BMI 39.51 kg/m   Wt Readings from Last 3 Encounters:  12/11/22 294 lb 14.4 oz (133.8 kg)  11/26/22 294 lb 1.6 oz (133.4 kg)  11/13/22 292 lb 1.6 oz (132.5 kg)    Physical Exam Vitals and nursing note  reviewed.  Constitutional:      General: He is not in acute distress.    Appearance: Normal appearance. He is obese. He is not ill-appearing, toxic-appearing or diaphoretic.  HENT:     Head: Normocephalic and atraumatic.     Right Ear: External ear normal.     Left Ear: External ear normal.     Nose: Nose normal.     Mouth/Throat:     Mouth: Mucous membranes are moist.     Pharynx: Oropharynx is clear.  Eyes:     General: No scleral icterus.       Right eye: No discharge.        Left eye: No discharge.     Extraocular Movements: Extraocular movements intact.     Conjunctiva/sclera: Conjunctivae normal.     Pupils: Pupils are equal, round, and reactive to light.  Cardiovascular:     Rate and Rhythm: Normal rate and regular rhythm.     Pulses: Normal pulses.     Heart sounds: Normal heart sounds. No murmur heard.    No friction rub. No gallop.  Pulmonary:     Effort: Pulmonary effort is normal. No respiratory distress.     Breath sounds: Normal breath sounds. No stridor. No wheezing, rhonchi or rales.  Chest:     Chest wall: No tenderness.  Musculoskeletal:        General: Normal range of motion.  Cervical back: Normal range of motion and neck supple.  Skin:    General: Skin is warm and dry.     Capillary Refill: Capillary refill takes less than 2 seconds.     Coloration: Skin is not jaundiced or pale.     Findings: No bruising, erythema, lesion or rash.  Neurological:     General: No focal deficit present.     Mental Status: He is alert and oriented to person, place, and time. Mental status is at baseline.  Psychiatric:        Mood and Affect: Mood normal.        Behavior: Behavior normal.        Thought Content: Thought content normal.        Judgment: Judgment normal.     Results for orders placed or performed in visit on 12/08/21  Comprehensive metabolic panel  Result Value Ref Range   Glucose 106 (H) 70 - 99 mg/dL   BUN 9 6 - 24 mg/dL   Creatinine, Ser 0.78  0.76 - 1.27 mg/dL   eGFR 105 >59 mL/min/1.73   BUN/Creatinine Ratio 12 9 - 20   Sodium 140 134 - 144 mmol/L   Potassium 4.5 3.5 - 5.2 mmol/L   Chloride 103 96 - 106 mmol/L   CO2 22 20 - 29 mmol/L   Calcium 9.4 8.7 - 10.2 mg/dL   Total Protein 6.8 6.0 - 8.5 g/dL   Albumin 4.2 3.8 - 4.9 g/dL   Globulin, Total 2.6 1.5 - 4.5 g/dL   Albumin/Globulin Ratio 1.6 1.2 - 2.2   Bilirubin Total <0.2 0.0 - 1.2 mg/dL   Alkaline Phosphatase 95 44 - 121 IU/L   AST 21 0 - 40 IU/L   ALT 20 0 - 44 IU/L  CBC with Differential/Platelet  Result Value Ref Range   WBC 9.5 3.4 - 10.8 x10E3/uL   RBC 4.83 4.14 - 5.80 x10E6/uL   Hemoglobin 15.4 13.0 - 17.7 g/dL   Hematocrit 45.3 37.5 - 51.0 %   MCV 94 79 - 97 fL   MCH 31.9 26.6 - 33.0 pg   MCHC 34.0 31.5 - 35.7 g/dL   RDW 13.2 11.6 - 15.4 %   Platelets 294 150 - 450 x10E3/uL   Neutrophils 60 Not Estab. %   Lymphs 29 Not Estab. %   Monocytes 8 Not Estab. %   Eos 3 Not Estab. %   Basos 0 Not Estab. %   Neutrophils Absolute 5.7 1.4 - 7.0 x10E3/uL   Lymphocytes Absolute 2.7 0.7 - 3.1 x10E3/uL   Monocytes Absolute 0.7 0.1 - 0.9 x10E3/uL   EOS (ABSOLUTE) 0.3 0.0 - 0.4 x10E3/uL   Basophils Absolute 0.0 0.0 - 0.2 x10E3/uL   Immature Granulocytes 0 Not Estab. %   Immature Grans (Abs) 0.0 0.0 - 0.1 x10E3/uL  Lipid Panel w/o Chol/HDL Ratio  Result Value Ref Range   Cholesterol, Total 233 (H) 100 - 199 mg/dL   Triglycerides 156 (H) 0 - 149 mg/dL   HDL 43 >39 mg/dL   VLDL Cholesterol Cal 29 5 - 40 mg/dL   LDL Chol Calc (NIH) 161 (H) 0 - 99 mg/dL  PSA  Result Value Ref Range   Prostate Specific Ag, Serum 0.8 0.0 - 4.0 ng/mL  TSH  Result Value Ref Range   TSH 2.130 0.450 - 4.500 uIU/mL  Urinalysis, Routine w reflex microscopic  Result Value Ref Range   Specific Gravity, UA 1.015 1.005 - 1.030   pH, UA 8.0 (  H) 5.0 - 7.5   Color, UA Yellow Yellow   Appearance Ur Clear Clear   Leukocytes,UA Negative Negative   Protein,UA Negative Negative/Trace    Glucose, UA Negative Negative   Ketones, UA Negative Negative   RBC, UA Negative Negative   Bilirubin, UA Negative Negative   Urobilinogen, Ur 0.2 0.2 - 1.0 mg/dL   Nitrite, UA Negative Negative      Assessment & Plan:   Problem List Items Addressed This Visit       Musculoskeletal and Integument   Other intervertebral disc degeneration, lumbar region - Primary    Still not doing any better. Has not started PT. Will keep him out of work until he is able to do PT and follow up with pain management. Call with any concerns.       Relevant Medications   baclofen (LIORESAL) 10 MG tablet     Follow up plan: Return in about 2 months (around 02/10/2023).

## 2022-12-11 NOTE — Assessment & Plan Note (Signed)
Still not doing any better. Has not started PT. Will keep him out of work until he is able to do PT and follow up with pain management. Call with any concerns.

## 2022-12-13 ENCOUNTER — Telehealth: Payer: Self-pay | Admitting: Family Medicine

## 2022-12-13 NOTE — Telephone Encounter (Signed)
Patient brought in [employer] form to be completed, patient stated that the insurance company said that the last office note could be sent the provider will not need to complete the form.Provider was notified and approved to send last office note. The last office note was printed, faxed and received. 12/13/22 @ 2:04 pm

## 2022-12-19 DIAGNOSIS — F17200 Nicotine dependence, unspecified, uncomplicated: Secondary | ICD-10-CM | POA: Diagnosis not present

## 2023-01-09 DIAGNOSIS — M5416 Radiculopathy, lumbar region: Secondary | ICD-10-CM | POA: Diagnosis not present

## 2023-01-10 ENCOUNTER — Encounter: Payer: Self-pay | Admitting: Physical Therapy

## 2023-01-10 ENCOUNTER — Other Ambulatory Visit: Payer: Self-pay

## 2023-01-10 ENCOUNTER — Ambulatory Visit: Payer: BC Managed Care – PPO | Attending: Family Medicine | Admitting: Physical Therapy

## 2023-01-10 DIAGNOSIS — M5416 Radiculopathy, lumbar region: Secondary | ICD-10-CM | POA: Diagnosis not present

## 2023-01-10 DIAGNOSIS — M5459 Other low back pain: Secondary | ICD-10-CM | POA: Diagnosis not present

## 2023-01-10 DIAGNOSIS — R262 Difficulty in walking, not elsewhere classified: Secondary | ICD-10-CM | POA: Diagnosis not present

## 2023-01-10 DIAGNOSIS — M6281 Muscle weakness (generalized): Secondary | ICD-10-CM

## 2023-01-10 NOTE — Therapy (Signed)
OUTPATIENT PHYSICAL THERAPY THORACOLUMBAR EVALUATION   Patient Name: MACLEAN FOISTER MRN: [id number] DOB:08-15-1966, 57 y.o., male Today's Date: 01/10/2023  END OF SESSION:  PT End of Session - 01/10/23 0921     Visit Number 1    Number of Visits 24    Date for PT Re-Evaluation 04/04/23    PT Start Time 0919    PT Stop Time 1015    PT Time Calculation (min) 56 min    Activity Tolerance Patient tolerated treatment well    Behavior During Therapy WFL for tasks assessed/performed             Past Medical History:  Diagnosis Date   Arthritis    Hyperlipidemia    Obesity (BMI 35.0-39.9 without comorbidity)    Tobacco abuse    Past Surgical History:  Procedure Laterality Date   ARTHRODESIS,ANTERIOR INTERBODY TECHNIQUE,INCLUDE MINIMAL DISKECTOMY TO PREPARE INTERSPACE; LUMBAR  01/29/2022   FINGER SURGERY Left    ring finger   KNEE SURGERY Right    Patient Active Problem List   Diagnosis Date Noted   Lumbar radiculopathy 12/08/2021   Other intervertebral disc degeneration, lumbar region 10/26/2020   Adjustment disorder with mixed anxiety and depressed mood 04/03/2019   Obesity (BMI 35.0-39.9 without comorbidity)    Tobacco abuse    Hyperlipidemia     PCP: Dorcas Carrow, DO   REFERRING PROVIDER: Dorcas Carrow, DO   REFERRING DIAG:  Diagnosis  M54.16 (ICD-10-CM) - Lumbar radiculopathy    Rationale for Evaluation and Treatment: Rehabilitation  THERAPY DIAG:  Other low back pain  Radiculopathy, lumbar region  Difficulty in walking, not elsewhere classified  Muscle weakness (generalized)  ONSET DATE: several years with multiple surgeries   SUBJECTIVE:                                                                                                                                                                                           SUBJECTIVE STATEMENT: Pt reports that he has low back and radicular s/s in the LLE and R hip for the past several  years. Has had multiple surgies in lumbar spine with short term relief for <6 months following each surgery.   PERTINENT HISTORY:  Hadi Dubin is a 57 y.o. male with a PMHx significant for arthritis and s/p L3-4 XLIF/revision PI on 01/29/22.  The lumbar spine examination shows posterior fusion from the level of C6 L4 to through L4. Disc spacers are noted at the level of L2-L3 and L3-L4  PAIN:  Are you having pain? Yes: NPRS scale: 8/10 Pain location: L hip  Pain description: burning/stabbing  Aggravating factors: constant Relieving factors: sitting still provides some short tern relief.   PRECAUTIONS: None and Other: history of back precautions following each surgery   WEIGHT BEARING RESTRICTIONS: No  FALLS:  Has patient fallen in last 6 months? Yes. Number of falls at least 6. Reports that L knee "gives out"  LIVING ENVIRONMENT: Lives with: lives with their spouse Lives in: Mobile home Stairs: Yes: External: 5 steps; on right going up, on left going up, and can reach both Has following equipment at home: Single point cane and has RW but does not use   OCCUPATION: disability   PLOF: Independent with household mobility without device and Requires assistive device for independence use of SPC in community   PATIENT GOALS: pain relief   NEXT MD VISIT: 02/11/2023 with Dr Laural Benes   OBJECTIVE:   DIAGNOSTIC FINDINGS:  Imaging/Tests: MRI Cervical Thoracic Lumbar Spine 07/23/22 FINDINGS: The C2-C3 disc level is within normal limits.   The C3-C4 disc level shows hypertrophy of the left uncovertebral joint leading to moderate stenosis of at the corresponding neural foramen.   The C4-C5 level shows minimal posterior disc osteophyte formation but is otherwise within normal limits.   The C5-C6 level is degraded by motion from shoulders but shows posterior disc osteophyte formation with some compression of the ventral subarachnoid space and moderate narrowing of the neural  foramina bilaterally and symmetrically.   The C6 vertebral body shows high T2 signal intensity. C7 vertebral body shows similar findings in the disc space between these 2 vertebral bodies is somewhat bright. Presumably this is degenerative in nature. Axial images are degraded by artifact from shoulders and cannot be adequately evaluated but there appears to be posterior disc osteophyte formation resulting in a borderline spinal canal diameter.   The C7-T1 disc level appears to be within normal limits.   The structures at the level of the foramen magnum are normal. The signal intensity from the spinal cord is unremarkable.   Thoracic spine shows kyphosis as well as minimal anterior wedging of the T7 and possibly T8 vertebral bodies. There are posterior disc bulges indenting the ventral subarachnoid space extending from the level of T5-T6 to T8-T9. There is no significant spinal canal stenosis. The spinal cord appears to be somewhat diminished in its caliber but is otherwise within normal limits.   The lumbar spine examination shows posterior fusion from the level of C6 L4 to through L4. Disc spacers are noted at the level of L2-L3 and L3-L4. There is now an additional caudal level of fusion when compared to the previous examination.   Metal artifact obstructs most of the view through the region of the fusion. Please note that axial images were not obtained through the lumbar spine as the patient requested to be evacuated from the scanner. There is a posterior disc herniation at the level of L2-L3 which was present previously and is unchanged. There is now what appears to be a diffuse disc bulge with a protrusion especially to the left at the L4-L5 level which has increased in severity when compared to the prior study.  IMPRESSION Degenerative changes in the cervical spine with no evidence of significant spinal canal stenosis.   No evidence of spinal canal stenosis in the thoracic region.   Interval  additional level of fusion at the level of L3-L4 when compared to the prior study. Interval progression of degenerative changes at the level of L4-L5. Incomplete study.   XR Lumbar Spine 07/10/22 FINDINGS:  Redemonstration of posterior  fusion from L2-L4 with intervertebral disc spacers. No perihardware lucency or fracture. Unchanged alignment. Intervertebral disc space narrowing at L4-L5 and L5-S1 levels. Facetal arthropathy at L1-L2 and L5-S1 levels.  IMPRESSION Posterior fusion L2-L4 with intervertebral disc spacers. No interval/new hardware complications.  Moderate degenerative disease of the lumbar spine at the nonfused levels.    PATIENT SURVEYS:  Modified Oswestry 28/50 56%   FOTO 32  SCREENING FOR RED FLAGS: Bowel or bladder incontinence: No Spinal tumors: No Cauda equina syndrome: No Compression fracture: No Abdominal aneurysm: No  COGNITION: Overall cognitive status: Within functional limits for tasks assessed     SENSATION: Light touch: Impaired   MUSCLE LENGTH: Hamstrings: Right  deg; Left  deg Maisie Fus test: Right  deg; Left  deg  POSTURE: rounded shoulders and posterior pelvic tilt  PALPATION: Bil lumbar scars. Tigger point in Bil paraspinals from T8-SI  joint.   LUMBAR ROM:   AROM eval  Flexion Limited due to Fusion  Extension 5 deg  Right lateral flexion 12  Left lateral flexion 10  Right rotation   Left rotation    (Blank rows = not tested)  LOWER EXTREMITY ROM:     Active  Right eval Left eval  Hip flexion    Hip extension    Hip abduction    Hip adduction    Hip internal rotation    Hip external rotation    Knee flexion    Knee extension    Ankle dorsiflexion    Ankle plantarflexion    Ankle inversion    Ankle eversion     (Blank rows = not tested)  LOWER EXTREMITY MMT:    MMT Right eval Left eval  Hip flexion 4 4  Hip extension    Hip abduction 4+ 4  Hip adduction 4+ 4  Hip internal rotation    Hip external rotation    Knee  flexion 4+ 4-  Knee extension 4+ 4  Ankle dorsiflexion 4+ 4+   Ankle plantarflexion    Ankle inversion    Ankle eversion     (Blank rows = not tested)  LUMBAR SPECIAL TESTS:  Straight leg raise test: Positive and Slump test: Positive  FUNCTIONAL TESTS:  5 times sit to stand:  25.44 heavy use of BUE on arm rests  Timed up and go (TUG): 17.14 10 meter walk test: 13.76 sec (0.41m/s)   GAIT: Distance walked: 75 Assistive device utilized: Single point cane Level of assistance: Modified independence Comments: antalgic, hip hike on the LLE, increased stance time on the LLE with use of SPC.   TODAY'S TREATMENT:                                                                                                                              DATE: 01/10/2023  Evaluation for LBP with radiculopathy      PATIENT EDUCATION:  Education details: POC Person educated: Patient Education method: Explanation Education comprehension: verbalized understanding and  needs further education  HOME EXERCISE PROGRAM: To be given at   ASSESSMENT:  CLINICAL IMPRESSION: Patient is a 57 y.o. male who was seen today for physical therapy evaluation and treatment for LBP with radiculopathy. Pt demonstrates significant impairment with pain radiating in BLE LLE>RLE. Pt positive for SLR AND Slump test. Noted to have mild centralization with seated extension from distal lateral L ankle to proximal shin. Pt reports pain in the lower back to palpation due to multiple trigger points in paraspinals. Pt has had multiple surgeries on lower back for radicular back pain in the past several years as well as injections for pain management. Noted to have increased fall risk with increased time in , TUG, and hight level of impairment as evident through ModODI and FOTO. Pt will benefir from skilled PT to reduce pain, improve strength, and function to allow improved QoL and return to PLOF.   OBJECTIVE IMPAIRMENTS: Abnormal gait,  cardiopulmonary status limiting activity, decreased activity tolerance, decreased balance, decreased endurance, decreased mobility, difficulty walking, decreased ROM, decreased strength, hypomobility, increased fascial restrictions, impaired perceived functional ability, impaired flexibility, impaired sensation, improper body mechanics, postural dysfunction, obesity, and pain.   ACTIVITY LIMITATIONS: carrying, lifting, bending, sitting, standing, and sleeping  PARTICIPATION LIMITATIONS: driving, shopping, community activity, and yard work  PERSONAL FACTORS: Age, Fitness, Past/current experiences, Social background, and 1 comorbidity: Arthritis   are also affecting patient's functional outcome.   REHAB POTENTIAL: Fair history or chronic LBP -  CLINICAL DECISION MAKING: Evolving/moderate complexity  EVALUATION COMPLEXITY: Moderate   GOALS:  SHORT TERM GOALS: Target date: 02/21/2023    Patient will be independent in home exercise program to improve strength/mobility for better functional independence with ADLs. Baseline: Goal status: INITIAL   LONG TERM GOALS: Target date: 04/04/2023    Patient will increase FOTO score to equal to or greater than   45  to demonstrate statistically significant improvement in mobility and quality of life.  Baseline: 32 Goal status: INITIAL  2.  Patient will reduce baseline pain level by 2 points to improve QoL and improve   Baseline: 8/10 Goal status: INITIAL  3.  Patient will increase walk test to >1070ft to demonstrate decreased fall risk during functional activities and improved access to community Baseline: to be completed at later date.  Goal status: INITIAL  4.  Patient will increase 10 meter walk test to >1.89m/s as to improve gait speed for better community ambulation and to reduce fall risk. Baseline: 0.106m/s  Goal status: INITIAL  5.  Patient will reduce timed up and go to <11 seconds to reduce fall risk and demonstrate improved  transfer/gait ability. Baseline: 17.14sec Goal status: INITIAL  6.  Patient will increase LLE to 4+/5 grossly to improve safety with community mobility and reduce risk of falls and independence with stair management.  Baseline: 4-/5 to 4/5 Goal status: INITIAL   PLAN:  PT FREQUENCY: 1-2x/week  PT DURATION: 12 weeks  PLANNED INTERVENTIONS: Therapeutic exercises, Therapeutic activity, Neuromuscular re-education, Balance training, Gait training, Patient/Family education, Self Care, Joint mobilization, Joint manipulation, Stair training, DME instructions, Spinal manipulation, Spinal mobilization, Cryotherapy, Moist heat, scar mobilization, and Manual therapy.  PLAN FOR NEXT SESSION:   6 minute walk test. Core stabilization. STM for paraspinals in sitting. HS and glute stretches. Nerve gliding. education on posture.    Grier Rocher PT, DPT  Physical Therapist - [place]  [facility]  1:40 PM 01/10/23

## 2023-01-16 ENCOUNTER — Ambulatory Visit: Payer: BC Managed Care – PPO | Admitting: Physical Therapy

## 2023-01-16 DIAGNOSIS — M5416 Radiculopathy, lumbar region: Secondary | ICD-10-CM | POA: Diagnosis not present

## 2023-01-16 DIAGNOSIS — R262 Difficulty in walking, not elsewhere classified: Secondary | ICD-10-CM

## 2023-01-16 DIAGNOSIS — M6281 Muscle weakness (generalized): Secondary | ICD-10-CM

## 2023-01-16 DIAGNOSIS — M5459 Other low back pain: Secondary | ICD-10-CM

## 2023-01-16 NOTE — Therapy (Unsigned)
OUTPATIENT PHYSICAL THERAPY THORACOLUMBAR TREATMENT   Patient Name: Fred Tran MRN: [id number] DOB:Oct 24, 1965, 57 y.o., male Today's Date: 01/17/2023  END OF SESSION:  PT End of Session - 01/16/23 1300     Visit Number 2    Number of Visits 24    Date for PT Re-Evaluation 04/04/23    PT Start Time 1305    PT Stop Time 1346    PT Time Calculation (min) 41 min    Activity Tolerance Patient tolerated treatment well;Patient limited by pain    Behavior During Therapy WFL for tasks assessed/performed             Past Medical History:  Diagnosis Date   Arthritis    Hyperlipidemia    Obesity (BMI 35.0-39.9 without comorbidity)    Tobacco abuse    Past Surgical History:  Procedure Laterality Date   ARTHRODESIS,ANTERIOR INTERBODY TECHNIQUE,INCLUDE MINIMAL DISKECTOMY TO PREPARE INTERSPACE; LUMBAR  01/29/2022   FINGER SURGERY Left    ring finger   KNEE SURGERY Right    Patient Active Problem List   Diagnosis Date Noted   Lumbar radiculopathy 12/08/2021   Other intervertebral disc degeneration, lumbar region 10/26/2020   Adjustment disorder with mixed anxiety and depressed mood 04/03/2019   Obesity (BMI 35.0-39.9 without comorbidity)    Tobacco abuse    Hyperlipidemia     PCP: Dorcas Carrow, DO   REFERRING PROVIDER: Dorcas Carrow, DO   REFERRING DIAG:  Diagnosis  M54.16 (ICD-10-CM) - Lumbar radiculopathy    Rationale for Evaluation and Treatment: Rehabilitation  THERAPY DIAG:  Other low back pain  Radiculopathy, lumbar region  Difficulty in walking, not elsewhere classified  Muscle weakness (generalized)  ONSET DATE: several years with multiple surgeries   SUBJECTIVE:                                                                                                                                                                                           SUBJECTIVE STATEMENT: Pt reports that his L hip is still in constant pain and increased R  knee weakness on this day, resulting in mutiple instances of knee instability with near LOB, but able to correct prior to fall.    PERTINENT HISTORY:  Fred Tran is a 57 y.o. male with a PMHx significant for arthritis and s/p L3-4 XLIF/revision PI on 01/29/22.  The lumbar spine examination shows posterior fusion from the level of C6 L4 to through L4. Disc spacers are noted at the level of L2-L3 and L3-L4  PAIN:  Are you having pain? Yes: NPRS scale: 8/10 Pain location: L hip and lower R  back   Pain description: burning/stabbing   Aggravating factors: constant Relieving factors: sitting still provides some short tern relief.   PRECAUTIONS: None and Other: history of back precautions following each surgery   WEIGHT BEARING RESTRICTIONS: No  FALLS:  Has patient fallen in last 6 months? Yes. Number of falls at least 6. Reports that L knee "gives out"  LIVING ENVIRONMENT: Lives with: lives with their spouse Lives in: Mobile home Stairs: Yes: External: 5 steps; on right going up, on left going up, and can reach both Has following equipment at home: Single point cane and has RW but does not use   OCCUPATION: disability   PLOF: Independent with household mobility without device and Requires assistive device for independence use of SPC in community   PATIENT GOALS: pain relief   NEXT MD VISIT: 02/11/2023 with Dr Laural Benes   OBJECTIVE:   DIAGNOSTIC FINDINGS:  Imaging/Tests: MRI Cervical Thoracic Lumbar Spine 07/23/22 FINDINGS: The C2-C3 disc level is within normal limits.   The C3-C4 disc level shows hypertrophy of the left uncovertebral joint leading to moderate stenosis of at the corresponding neural foramen.   The C4-C5 level shows minimal posterior disc osteophyte formation but is otherwise within normal limits.   The C5-C6 level is degraded by motion from shoulders but shows posterior disc osteophyte formation with some compression of the ventral subarachnoid space and  moderate narrowing of the neural foramina bilaterally and symmetrically.   The C6 vertebral body shows high T2 signal intensity. C7 vertebral body shows similar findings in the disc space between these 2 vertebral bodies is somewhat bright. Presumably this is degenerative in nature. Axial images are degraded by artifact from shoulders and cannot be adequately evaluated but there appears to be posterior disc osteophyte formation resulting in a borderline spinal canal diameter.   The C7-T1 disc level appears to be within normal limits.   The structures at the level of the foramen magnum are normal. The signal intensity from the spinal cord is unremarkable.   Thoracic spine shows kyphosis as well as minimal anterior wedging of the T7 and possibly T8 vertebral bodies. There are posterior disc bulges indenting the ventral subarachnoid space extending from the level of T5-T6 to T8-T9. There is no significant spinal canal stenosis. The spinal cord appears to be somewhat diminished in its caliber but is otherwise within normal limits.   The lumbar spine examination shows posterior fusion from the level of C6 L4 to through L4. Disc spacers are noted at the level of L2-L3 and L3-L4. There is now an additional caudal level of fusion when compared to the previous examination.   Metal artifact obstructs most of the view through the region of the fusion. Please note that axial images were not obtained through the lumbar spine as the patient requested to be evacuated from the scanner. There is a posterior disc herniation at the level of L2-L3 which was present previously and is unchanged. There is now what appears to be a diffuse disc bulge with a protrusion especially to the left at the L4-L5 level which has increased in severity when compared to the prior study.  IMPRESSION Degenerative changes in the cervical spine with no evidence of significant spinal canal stenosis.   No evidence of spinal canal stenosis in  the thoracic region.   Interval additional level of fusion at the level of L3-L4 when compared to the prior study. Interval progression of degenerative changes at the level of L4-L5. Incomplete study.   XR  Lumbar Spine 07/10/22 FINDINGS:  Redemonstration of posterior fusion from L2-L4 with intervertebral disc spacers. No perihardware lucency or fracture. Unchanged alignment. Intervertebral disc space narrowing at L4-L5 and L5-S1 levels. Facetal arthropathy at L1-L2 and L5-S1 levels.  IMPRESSION Posterior fusion L2-L4 with intervertebral disc spacers. No interval/new hardware complications.  Moderate degenerative disease of the lumbar spine at the nonfused levels.    PATIENT SURVEYS:  Modified Oswestry 28/50 56%   FOTO 32  SCREENING FOR RED FLAGS: Bowel or bladder incontinence: No Spinal tumors: No Cauda equina syndrome: No Compression fracture: No Abdominal aneurysm: No  COGNITION: Overall cognitive status: Within functional limits for tasks assessed     SENSATION: Light touch: Impaired   MUSCLE LENGTH: Hamstrings: Right  deg; Left  deg Maisie Fus test: Right  deg; Left  deg  POSTURE: rounded shoulders and posterior pelvic tilt  PALPATION: Bil lumbar scars. Tigger point in Bil paraspinals from T8-SI  joint.   LUMBAR ROM:   AROM eval  Flexion Limited due to Fusion  Extension 5 deg  Right lateral flexion 12  Left lateral flexion 10  Right rotation   Left rotation    (Blank rows = not tested)  LOWER EXTREMITY ROM:     Active  Right eval Left eval  Hip flexion    Hip extension    Hip abduction    Hip adduction    Hip internal rotation    Hip external rotation    Knee flexion    Knee extension    Ankle dorsiflexion    Ankle plantarflexion    Ankle inversion    Ankle eversion     (Blank rows = not tested)  LOWER EXTREMITY MMT:    MMT Right eval Left eval  Hip flexion 4 4  Hip extension    Hip abduction 4+ 4  Hip adduction 4+ 4  Hip internal rotation     Hip external rotation    Knee flexion 4+ 4-  Knee extension 4+ 4  Ankle dorsiflexion 4+ 4+   Ankle plantarflexion    Ankle inversion    Ankle eversion     (Blank rows = not tested)  LUMBAR SPECIAL TESTS:  Straight leg raise test: Positive and Slump test: Positive  FUNCTIONAL TESTS:  5 times sit to stand:  25.44 heavy use of BUE on arm rests  Timed up and go (TUG): 17.14 6 minute walk test: 415ft 1 standing break. Unable to complete 6 min; Pt required to sit at 3:44. 10 meter walk test: 13.76 sec (0.94m/s)   GAIT: Distance walked: 75 Assistive device utilized: Single point cane Level of assistance: Modified independence Comments: antalgic, hip hike on the LLE, increased stance time on the LLE with use of SPC.   TODAY'S TREATMENT:                                                                                                                              DATE: 01/17/2023  Nustep reciprocal movement training.  Level 3 x 3.5 min level 1 x 1.62min   Seated piriformis stretch 2 x 40sec  Neural tensioner x 8 with 5 sec hold; bil.   Seated trunkal extension 3 x 10 with cues to remain in pain free range. Pt reports no peritonealization or centralization of s/s.    Manual therapy to perform STM with trigger point release to paraspinals from lower thoracic region into lumbar spine. Pt reports "pinching" with R>L L3-4 region with STM and increased sensitivity at SI joint. Performed x 8 min   6 Min Walk Test:  Instructed patient to ambulate as quickly and as safely as possible for 6 minutes using LRAD. Patient was allowed to take standing rest breaks without stopping the test, but if the patient required a sitting rest break the clock would be stopped and the test would be over.  Results: 413ft feet in 3:44  using a SPC with supervision assist. Results indicate that the patient has reduced endurance with ambulation compared to age matched norms.  Age Matched Norms: 71-69 yo M: 23 F: 62,  65-79 yo M: 70 F: 471, 36-89 yo M: 417 F: 392 MDC: 58.21 meters (190.98 feet) or 50 meters (ANPTA Core Set of Outcome Measures for Adults with Neurologic Conditions, 2018)     PATIENT EDUCATION:  Education details: POC Person educated: Patient Education method: Explanation Education comprehension: verbalized understanding and needs further education  HOME EXERCISE PROGRAM: Piriformis stretch 3 x 30 sec  Sciatic nerve glides 5 sec hold x 10 bil  Seated trunkal extension within pain free range 2 x 10   ASSESSMENT:  CLINICAL IMPRESSION: Patient put forth good effort throughout session, but limited by pain and RLE weakness. Pt demonstrates severe reduction to community mobility limited to 479ft in 3:38min in 6 min walk test. Pt demonstrates significant sensitivty to STM in Bil paraspinals R>L, but noted to have significant trigger points from mid to low back.  Pt will benefiT from skilled PT to reduce pain, improve strength, and function to allow improved QoL and return to PLOF.   OBJECTIVE IMPAIRMENTS: Abnormal gait, cardiopulmonary status limiting activity, decreased activity tolerance, decreased balance, decreased endurance, decreased mobility, difficulty walking, decreased ROM, decreased strength, hypomobility, increased fascial restrictions, impaired perceived functional ability, impaired flexibility, impaired sensation, improper body mechanics, postural dysfunction, obesity, and pain.   ACTIVITY LIMITATIONS: carrying, lifting, bending, sitting, standing, and sleeping  PARTICIPATION LIMITATIONS: driving, shopping, community activity, and yard work  PERSONAL FACTORS: Age, Fitness, Past/current experiences, Social background, and 1 comorbidity: Arthritis   are also affecting patient's functional outcome.   REHAB POTENTIAL: Fair history or chronic LBP -  CLINICAL DECISION MAKING: Evolving/moderate complexity  EVALUATION COMPLEXITY: Moderate   GOALS:  SHORT TERM GOALS: Target  date: 02/21/2023    Patient will be independent in home exercise program to improve strength/mobility for better functional independence with ADLs. Baseline: Goal status: INITIAL   LONG TERM GOALS: Target date: 04/04/2023    Patient will increase FOTO score to equal to or greater than   45  to demonstrate statistically significant improvement in mobility and quality of life.  Baseline: 32 Goal status: INITIAL  2.  Patient will reduce baseline pain level by 2 points to improve QoL and improve   Baseline: 8/10 Goal status: INITIAL  3.  Patient will increase walk test to >1027ft to demonstrate decreased fall risk during functional activities and improved access to community Baseline: to be completed at later date.  Goal  status: INITIAL  4.  Patient will increase 10 meter walk test to >1.21m/s as to improve gait speed for better community ambulation and to reduce fall risk. Baseline: 0.24m/s  Goal status: INITIAL  5.  Patient will reduce timed up and go to <11 seconds to reduce fall risk and demonstrate improved transfer/gait ability. Baseline: 17.14sec Goal status: INITIAL  6.  Patient will increase LLE to 4+/5 grossly to improve safety with community mobility and reduce risk of falls and independence with stair management.  Baseline: 4-/5 to 4/5 Goal status: INITIAL   PLAN:  PT FREQUENCY: 1-2x/week  PT DURATION: 12 weeks  PLANNED INTERVENTIONS: Therapeutic exercises, Therapeutic activity, Neuromuscular re-education, Balance training, Gait training, Patient/Family education, Self Care, Joint mobilization, Joint manipulation, Stair training, DME instructions, Spinal manipulation, Spinal mobilization, Cryotherapy, Moist heat, scar mobilization, and Manual therapy.  PLAN FOR NEXT SESSION:   Core stabilization. STM for paraspinals in sitting. HS and glute stretches. Nerve gliding. education on posture. Adjust HEP as needed   Grier Rocher PT, DPT  Physical Therapist -  [facility]  10:03 AM 01/17/23

## 2023-01-18 ENCOUNTER — Ambulatory Visit: Payer: BC Managed Care – PPO | Admitting: Physical Therapy

## 2023-01-18 DIAGNOSIS — M6281 Muscle weakness (generalized): Secondary | ICD-10-CM

## 2023-01-18 DIAGNOSIS — M5416 Radiculopathy, lumbar region: Secondary | ICD-10-CM | POA: Diagnosis not present

## 2023-01-18 DIAGNOSIS — R262 Difficulty in walking, not elsewhere classified: Secondary | ICD-10-CM

## 2023-01-18 DIAGNOSIS — M5459 Other low back pain: Secondary | ICD-10-CM | POA: Diagnosis not present

## 2023-01-18 NOTE — Therapy (Signed)
OUTPATIENT PHYSICAL THERAPY THORACOLUMBAR TREATMENT   Patient Name: Fred Tran MRN: [id number] DOB:[birth year], 57 y.o., male Today's Date: 01/18/2023  END OF SESSION:  PT End of Session - 01/18/23 1022     Visit Number 3    Number of Visits 24    Date for PT Re-Evaluation 04/04/23    PT Start Time 1021    PT Stop Time 1102    PT Time Calculation (min) 41 min    Activity Tolerance Patient tolerated treatment well;Patient limited by pain    Behavior During Therapy WFL for tasks assessed/performed             Past Medical History:  Diagnosis Date   Arthritis    Hyperlipidemia    Obesity (BMI 35.0-39.9 without comorbidity)    Tobacco abuse    Past Surgical History:  Procedure Laterality Date   ARTHRODESIS,ANTERIOR INTERBODY TECHNIQUE,INCLUDE MINIMAL DISKECTOMY TO PREPARE INTERSPACE; LUMBAR  01/29/2022   FINGER SURGERY Left    ring finger   KNEE SURGERY Right    Patient Active Problem List   Diagnosis Date Noted   Lumbar radiculopathy 12/08/2021   Other intervertebral disc degeneration, lumbar region 10/26/2020   Adjustment disorder with mixed anxiety and depressed mood 04/03/2019   Obesity (BMI 35.0-39.9 without comorbidity)    Tobacco abuse    Hyperlipidemia     PCP: Dorcas Carrow, DO   REFERRING PROVIDER: Dorcas Carrow, DO   REFERRING DIAG:  Diagnosis  M54.16 (ICD-10-CM) - Lumbar radiculopathy    Rationale for Evaluation and Treatment: Rehabilitation  THERAPY DIAG:  Other low back pain  Radiculopathy, lumbar region  Difficulty in walking, not elsewhere classified  Muscle weakness (generalized)  ONSET DATE: several years with multiple surgeries   SUBJECTIVE:                                                                                                                                                                                           SUBJECTIVE STATEMENT: Pt reports that his pain is slightly better than yesterday. States  that his back "popped and cracked" yesterday resulting in severe stiffness and soreness.     PERTINENT HISTORY:  Fred Tran is a 57 y.o. male with a PMHx significant for arthritis and s/p L3-4 XLIF/revision PI on 01/29/22.  The lumbar spine examination shows posterior fusion from the level of C6 L4 to through L4. Disc spacers are noted at the level of L2-L3 and L3-L4  PAIN:  Are you having pain? Yes: NPRS scale: 8/10 Pain location: L hip and lower R back   Pain description: burning/stabbing   Aggravating factors: constant  Relieving factors: sitting still provides some short tern relief.   PRECAUTIONS: None and Other: history of back precautions following each surgery   WEIGHT BEARING RESTRICTIONS: No  FALLS:  Has patient fallen in last 6 months? Yes. Number of falls at least 6. Reports that L knee "gives out"  LIVING ENVIRONMENT: Lives with: lives with their spouse Lives in: Mobile home Stairs: Yes: External: 5 steps; on right going up, on left going up, and can reach both Has following equipment at home: Single point cane and has RW but does not use   OCCUPATION: disability   PLOF: Independent with household mobility without device and Requires assistive device for independence use of SPC in community   PATIENT GOALS: pain relief   NEXT MD VISIT: 02/11/2023 with Dr Laural Benes   OBJECTIVE:   DIAGNOSTIC FINDINGS:  Imaging/Tests: MRI Cervical Thoracic Lumbar Spine 07/23/22 FINDINGS: The C2-C3 disc level is within normal limits.   The C3-C4 disc level shows hypertrophy of the left uncovertebral joint leading to moderate stenosis of at the corresponding neural foramen.   The C4-C5 level shows minimal posterior disc osteophyte formation but is otherwise within normal limits.   The C5-C6 level is degraded by motion from shoulders but shows posterior disc osteophyte formation with some compression of the ventral subarachnoid space and moderate narrowing of the neural  foramina bilaterally and symmetrically.   The C6 vertebral body shows high T2 signal intensity. C7 vertebral body shows similar findings in the disc space between these 2 vertebral bodies is somewhat bright. Presumably this is degenerative in nature. Axial images are degraded by artifact from shoulders and cannot be adequately evaluated but there appears to be posterior disc osteophyte formation resulting in a borderline spinal canal diameter.   The C7-T1 disc level appears to be within normal limits.   The structures at the level of the foramen magnum are normal. The signal intensity from the spinal cord is unremarkable.   Thoracic spine shows kyphosis as well as minimal anterior wedging of the T7 and possibly T8 vertebral bodies. There are posterior disc bulges indenting the ventral subarachnoid space extending from the level of T5-T6 to T8-T9. There is no significant spinal canal stenosis. The spinal cord appears to be somewhat diminished in its caliber but is otherwise within normal limits.   The lumbar spine examination shows posterior fusion from the level of C6 L4 to through L4. Disc spacers are noted at the level of L2-L3 and L3-L4. There is now an additional caudal level of fusion when compared to the previous examination.   Metal artifact obstructs most of the view through the region of the fusion. Please note that axial images were not obtained through the lumbar spine as the patient requested to be evacuated from the scanner. There is a posterior disc herniation at the level of L2-L3 which was present previously and is unchanged. There is now what appears to be a diffuse disc bulge with a protrusion especially to the left at the L4-L5 level which has increased in severity when compared to the prior study.  IMPRESSION Degenerative changes in the cervical spine with no evidence of significant spinal canal stenosis.   No evidence of spinal canal stenosis in the thoracic region.   Interval  additional level of fusion at the level of L3-L4 when compared to the prior study. Interval progression of degenerative changes at the level of L4-L5. Incomplete study.   XR Lumbar Spine 07/10/22 FINDINGS:  Redemonstration of posterior fusion from L2-L4  with intervertebral disc spacers. No perihardware lucency or fracture. Unchanged alignment. Intervertebral disc space narrowing at L4-L5 and L5-S1 levels. Facetal arthropathy at L1-L2 and L5-S1 levels.  IMPRESSION Posterior fusion L2-L4 with intervertebral disc spacers. No interval/new hardware complications.  Moderate degenerative disease of the lumbar spine at the nonfused levels.    PATIENT SURVEYS:  Modified Oswestry 28/50 56%   FOTO 32  SCREENING FOR RED FLAGS: Bowel or bladder incontinence: No Spinal tumors: No Cauda equina syndrome: No Compression fracture: No Abdominal aneurysm: No  COGNITION: Overall cognitive status: Within functional limits for tasks assessed     SENSATION: Light touch: Impaired   MUSCLE LENGTH: Hamstrings: Right  deg; Left  deg Maisie Fus test: Right  deg; Left  deg  POSTURE: rounded shoulders and posterior pelvic tilt  PALPATION: Bil lumbar scars. Tigger point in Bil paraspinals from T8-SI  joint.   LUMBAR ROM:   AROM eval  Flexion Limited due to Fusion  Extension 5 deg  Right lateral flexion 12  Left lateral flexion 10  Right rotation   Left rotation    (Blank rows = not tested)  LOWER EXTREMITY ROM:     Active  Right eval Left eval  Hip flexion    Hip extension    Hip abduction    Hip adduction    Hip internal rotation    Hip external rotation    Knee flexion    Knee extension    Ankle dorsiflexion    Ankle plantarflexion    Ankle inversion    Ankle eversion     (Blank rows = not tested)  LOWER EXTREMITY MMT:    MMT Right eval Left eval  Hip flexion 4 4  Hip extension    Hip abduction 4+ 4  Hip adduction 4+ 4  Hip internal rotation    Hip external rotation    Knee  flexion 4+ 4-  Knee extension 4+ 4  Ankle dorsiflexion 4+ 4+   Ankle plantarflexion    Ankle inversion    Ankle eversion     (Blank rows = not tested)  LUMBAR SPECIAL TESTS:  Straight leg raise test: Positive and Slump test: Positive  FUNCTIONAL TESTS:  5 times sit to stand:  25.44 heavy use of BUE on arm rests  Timed up and go (TUG): 17.14 6 minute walk test: 4106ft 1 standing break. Unable to complete 6 min; Pt required to sit at 3:44. 10 meter walk test: 13.76 sec (0.7m/s)   GAIT: Distance walked: 75 Assistive device utilized: Single point cane Level of assistance: Modified independence Comments: antalgic, hip hike on the LLE, increased stance time on the LLE with use of SPC.   TODAY'S TREATMENT:                                                                                                                              DATE: 01/18/2023  Pt reports inability to be in prone or supine for any  length of time.  Modified dead bug in sitting 3 sec hold 2x 5 bil  Core stabilization to resist rotation from RTB 10 sec hold 2x 5 bil  Med ball forward roll 2 x 10 within pain free range.  Scapular retraction with slight trunk extension at end range. 2 x 10  Hip abduction with RTB doubled 2 x 10  LAQ 5# ankle weight 2 x 01  Standing Hip flexor stretch 2 x 20 sec bil.  Cues for deep core activation and improved hold at end range while maintaining care stabilization throughout session .     PATIENT EDUCATION:  Education details: POC Pt educated throughout session about proper posture and technique with exercises. Improved exercise technique, movement at target joints, use of target muscles after min to mod verbal, visual, tactile cues.  Person educated: Patient Education method: Explanation Education comprehension: verbalized understanding and needs further education  HOME EXERCISE PROGRAM: Piriformis stretch 3 x 30 sec  Sciatic nerve glides 5 sec hold x 10 bil  Seated trunkal  extension within pain free range 2 x 10   ASSESSMENT:  CLINICAL IMPRESSION: Patient put forth good effort throughout session, but limited by pain PT treatment focused on deep core stability with cues from PT to reduce compensatory movement through thoracic spine. Pt will benefiT from skilled PT to reduce pain, improve strength, and function to allow improved QoL and return to PLOF.   OBJECTIVE IMPAIRMENTS: Abnormal gait, cardiopulmonary status limiting activity, decreased activity tolerance, decreased balance, decreased endurance, decreased mobility, difficulty walking, decreased ROM, decreased strength, hypomobility, increased fascial restrictions, impaired perceived functional ability, impaired flexibility, impaired sensation, improper body mechanics, postural dysfunction, obesity, and pain.   ACTIVITY LIMITATIONS: carrying, lifting, bending, sitting, standing, and sleeping  PARTICIPATION LIMITATIONS: driving, shopping, community activity, and yard work  PERSONAL FACTORS: Age, Fitness, Past/current experiences, Social background, and 1 comorbidity: Arthritis   are also affecting patient's functional outcome.   REHAB POTENTIAL: Fair history or chronic LBP -  CLINICAL DECISION MAKING: Evolving/moderate complexity  EVALUATION COMPLEXITY: Moderate   GOALS:  SHORT TERM GOALS: Target date: 02/21/2023    Patient will be independent in home exercise program to improve strength/mobility for better functional independence with ADLs. Baseline: Goal status: INITIAL   LONG TERM GOALS: Target date: 04/04/2023    Patient will increase FOTO score to equal to or greater than   45  to demonstrate statistically significant improvement in mobility and quality of life.  Baseline: 32 Goal status: INITIAL  2.  Patient will reduce baseline pain level by 2 points to improve QoL and improve   Baseline: 8/10 Goal status: INITIAL  3.  Patient will increase walk test to >107ft to demonstrate  decreased fall risk during functional activities and improved access to community Baseline: to be completed at later date.  Goal status: INITIAL  4.  Patient will increase 10 meter walk test to >1.19m/s as to improve gait speed for better community ambulation and to reduce fall risk. Baseline: 0.43m/s  Goal status: INITIAL  5.  Patient will reduce timed up and go to <11 seconds to reduce fall risk and demonstrate improved transfer/gait ability. Baseline: 17.14sec Goal status: INITIAL  6.  Patient will increase LLE to 4+/5 grossly to improve safety with community mobility and reduce risk of falls and independence with stair management.  Baseline: 4-/5 to 4/5 Goal status: INITIAL   PLAN:  PT FREQUENCY: 1-2x/week  PT DURATION: 12 weeks  PLANNED INTERVENTIONS: Therapeutic exercises, Therapeutic activity, Neuromuscular  re-education, Balance training, Gait training, Patient/Family education, Self Care, Joint mobilization, Joint manipulation, Stair training, DME instructions, Spinal manipulation, Spinal mobilization, Cryotherapy, Moist heat, scar mobilization, and Manual therapy.  PLAN FOR NEXT SESSION:   Core stabilization. STM for paraspinals in sitting. education on posture. Adjust HEP as needed   Grier Rocher PT, DPT  Physical Therapist - [facility]  [facility]  11:27 AM 01/18/23

## 2023-01-22 NOTE — Therapy (Unsigned)
OUTPATIENT PHYSICAL THERAPY THORACOLUMBAR TREATMENT   Patient Name: Fred Tran MRN: [id number] DOB:1966-07-10, 57 y.o., male Today's Date: 01/23/2023  END OF SESSION:  PT End of Session - 01/23/23 0810     Visit Number 4    Number of Visits 24    Date for PT Re-Evaluation 04/04/23    PT Start Time 0807    PT Stop Time 0845    PT Time Calculation (min) 38 min    Activity Tolerance Patient tolerated treatment well;Patient limited by pain    Behavior During Therapy [facility] for tasks assessed/performed              Past Medical History:  Diagnosis Date   Arthritis    Hyperlipidemia    Obesity (BMI 35.0-39.9 without comorbidity)    Tobacco abuse    Past Surgical History:  Procedure Laterality Date   ARTHRODESIS,ANTERIOR INTERBODY TECHNIQUE,INCLUDE MINIMAL DISKECTOMY TO PREPARE INTERSPACE; LUMBAR  01/29/2022   FINGER SURGERY Left    ring finger   KNEE SURGERY Right    Patient Active Problem List   Diagnosis Date Noted   Lumbar radiculopathy 12/08/2021   Other intervertebral disc degeneration, lumbar region 10/26/2020   Adjustment disorder with mixed anxiety and depressed mood 04/03/2019   Obesity (BMI 35.0-39.9 without comorbidity)    Tobacco abuse    Hyperlipidemia     PCP: Dorcas Carrow, DO   REFERRING PROVIDER: Dorcas Carrow, DO   REFERRING DIAG:  Diagnosis  M54.16 (ICD-10-CM) - Lumbar radiculopathy    Rationale for Evaluation and Treatment: Rehabilitation  THERAPY DIAG:  No diagnosis found.  ONSET DATE: several years with multiple surgeries   SUBJECTIVE:                                                                                                                                                                                           SUBJECTIVE STATEMENT: Pt reports continued pain in his back and L hip. No changes since last session   PERTINENT HISTORY:  Eiker Saed is a 57 y.o. male with a PMHx significant for arthritis and  s/p L3-4 XLIF/revision PI on 01/29/22.  The lumbar spine examination shows posterior fusion from the level of C6 L4 to through L4. Disc spacers are noted at the level of L2-L3 and L3-L4  PAIN:  Are you having pain? Yes: NPRS scale: 8/10 Pain location: L hip and lower R back   Pain description: burning/stabbing   Aggravating factors: constant Relieving factors: sitting still provides some short tern relief.   PRECAUTIONS: None and Other: history of back precautions following each surgery   WEIGHT BEARING  RESTRICTIONS: No  FALLS:  Has patient fallen in last 6 months? Yes. Number of falls at least 6. Reports that L knee "gives out"  LIVING ENVIRONMENT: Lives with: lives with their spouse Lives in: Mobile home Stairs: Yes: External: 5 steps; on right going up, on left going up, and can reach both Has following equipment at home: Single point cane and has RW but does not use   OCCUPATION: disability   PLOF: Independent with household mobility without device and Requires assistive device for independence use of SPC in community   PATIENT GOALS: pain relief   NEXT MD VISIT: 02/11/2023 with Dr Laural Benes 6/24: pain management follow up visit.   OBJECTIVE:   DIAGNOSTIC FINDINGS:  Imaging/Tests: MRI Cervical Thoracic Lumbar Spine 07/23/22 FINDINGS: The C2-C3 disc level is within normal limits.   The C3-C4 disc level shows hypertrophy of the left uncovertebral joint leading to moderate stenosis of at the corresponding neural foramen.   The C4-C5 level shows minimal posterior disc osteophyte formation but is otherwise within normal limits.   The C5-C6 level is degraded by motion from shoulders but shows posterior disc osteophyte formation with some compression of the ventral subarachnoid space and moderate narrowing of the neural foramina bilaterally and symmetrically.   The C6 vertebral body shows high T2 signal intensity. C7 vertebral body shows similar findings in the disc space between  these 2 vertebral bodies is somewhat bright. Presumably this is degenerative in nature. Axial images are degraded by artifact from shoulders and cannot be adequately evaluated but there appears to be posterior disc osteophyte formation resulting in a borderline spinal canal diameter.   The C7-T1 disc level appears to be within normal limits.   The structures at the level of the foramen magnum are normal. The signal intensity from the spinal cord is unremarkable.   Thoracic spine shows kyphosis as well as minimal anterior wedging of the T7 and possibly T8 vertebral bodies. There are posterior disc bulges indenting the ventral subarachnoid space extending from the level of T5-T6 to T8-T9. There is no significant spinal canal stenosis. The spinal cord appears to be somewhat diminished in its caliber but is otherwise within normal limits.   The lumbar spine examination shows posterior fusion from the level of C6 L4 to through L4. Disc spacers are noted at the level of L2-L3 and L3-L4. There is now an additional caudal level of fusion when compared to the previous examination.   Metal artifact obstructs most of the view through the region of the fusion. Please note that axial images were not obtained through the lumbar spine as the patient requested to be evacuated from the scanner. There is a posterior disc herniation at the level of L2-L3 which was present previously and is unchanged. There is now what appears to be a diffuse disc bulge with a protrusion especially to the left at the L4-L5 level which has increased in severity when compared to the prior study.  IMPRESSION Degenerative changes in the cervical spine with no evidence of significant spinal canal stenosis.   No evidence of spinal canal stenosis in the thoracic region.   Interval additional level of fusion at the level of L3-L4 when compared to the prior study. Interval progression of degenerative changes at the level of L4-L5. Incomplete  study.   XR Lumbar Spine 07/10/22 FINDINGS:  Redemonstration of posterior fusion from L2-L4 with intervertebral disc spacers. No perihardware lucency or fracture. Unchanged alignment. Intervertebral disc space narrowing at L4-L5 and L5-S1 levels.  Facetal arthropathy at L1-L2 and L5-S1 levels.  IMPRESSION Posterior fusion L2-L4 with intervertebral disc spacers. No interval/new hardware complications.  Moderate degenerative disease of the lumbar spine at the nonfused levels.    PATIENT SURVEYS:  Modified Oswestry 28/50 56%   FOTO 32  SCREENING FOR RED FLAGS: Bowel or bladder incontinence: No Spinal tumors: No Cauda equina syndrome: No Compression fracture: No Abdominal aneurysm: No  COGNITION: Overall cognitive status: Within functional limits for tasks assessed     SENSATION: Light touch: Impaired   MUSCLE LENGTH: Hamstrings: Right  deg; Left  deg Maisie Fus test: Right  deg; Left  deg  POSTURE: rounded shoulders and posterior pelvic tilt  PALPATION: Bil lumbar scars. Tigger point in Bil paraspinals from T8-SI  joint.   LUMBAR ROM:   AROM eval  Flexion Limited due to Fusion  Extension 5 deg  Right lateral flexion 12  Left lateral flexion 10  Right rotation   Left rotation    (Blank rows = not tested)  LOWER EXTREMITY ROM:     Active  Right eval Left eval  Hip flexion    Hip extension    Hip abduction    Hip adduction    Hip internal rotation    Hip external rotation    Knee flexion    Knee extension    Ankle dorsiflexion    Ankle plantarflexion    Ankle inversion    Ankle eversion     (Blank rows = not tested)  LOWER EXTREMITY MMT:    MMT Right eval Left eval  Hip flexion 4 4  Hip extension    Hip abduction 4+ 4  Hip adduction 4+ 4  Hip internal rotation    Hip external rotation    Knee flexion 4+ 4-  Knee extension 4+ 4  Ankle dorsiflexion 4+ 4+   Ankle plantarflexion    Ankle inversion    Ankle eversion     (Blank rows = not  tested)  LUMBAR SPECIAL TESTS:  Straight leg raise test: Positive and Slump test: Positive  FUNCTIONAL TESTS:  5 times sit to stand:  25.44 heavy use of BUE on arm rests  Timed up and go (TUG): 17.14 6 minute walk test: 437ft 1 standing break. Unable to complete 6 min; Pt required to sit at 3:44. 10 meter walk test: 13.76 sec (0.40m/s)   GAIT: Distance walked: 75 Assistive device utilized: Single point cane Level of assistance: Modified independence Comments: antalgic, hip hike on the LLE, increased stance time on the LLE with use of SPC.   TODAY'S TREATMENT:                                                                                                                              DATE: 01/23/2023  Pt reports inability to be in prone or supine for any length of time.  Med ball forward roll x 8 x 10 sec holds ant, and bilateral ( 8 ea)  Standing Hip flexor stretch 2 x 45 sec sec bil. Seated clamshell x15 reps with BTB  Standing knee to chest stretch variation with 1 one foot on floor and 1 on 12 in step 2 x 45 sec holds ea LE  Core stabalization - seated trA activation with palpation of target ares 10 x 5 sec   Pt session was cut a little short today due to pt arriving a few minutes late to scheduled appointment time    PATIENT EDUCATION:  Education details: POC Pt educated throughout session about proper posture and technique with exercises. Improved exercise technique, movement at target joints, use of target muscles after min to mod verbal, visual, tactile cues.  Person educated: Patient Education method: Explanation Education comprehension: verbalized understanding and needs further education  HOME EXERCISE PROGRAM: Piriformis stretch 3 x 30 sec  Sciatic nerve glides 5 sec hold x 10 bil  Seated trunkal extension within pain free range 2 x 10   ASSESSMENT:  CLINICAL IMPRESSION: Patient present with good motivation for completion of physical therapy activities.   Physical therapist continues with seated and standing hip stretching as well as back and core related exercises with good overall tolerance.  Patient reports minimal improvement in pain following session but no significant change. Pt will continue to benefit from skilled physical therapy intervention to address impairments, improve QOL, and attain therapy goals.    OBJECTIVE IMPAIRMENTS: Abnormal gait, cardiopulmonary status limiting activity, decreased activity tolerance, decreased balance, decreased endurance, decreased mobility, difficulty walking, decreased ROM, decreased strength, hypomobility, increased fascial restrictions, impaired perceived functional ability, impaired flexibility, impaired sensation, improper body mechanics, postural dysfunction, obesity, and pain.   ACTIVITY LIMITATIONS: carrying, lifting, bending, sitting, standing, and sleeping  PARTICIPATION LIMITATIONS: driving, shopping, community activity, and yard work  PERSONAL FACTORS: Age, Fitness, Past/current experiences, Social background, and 1 comorbidity: Arthritis   are also affecting patient's functional outcome.   REHAB POTENTIAL: Fair history or chronic LBP -  CLINICAL DECISION MAKING: Evolving/moderate complexity  EVALUATION COMPLEXITY: Moderate   GOALS:  SHORT TERM GOALS: Target date: 02/21/2023    Patient will be independent in home exercise program to improve strength/mobility for better functional independence with ADLs. Baseline:  Goal status: INITIAL   LONG TERM GOALS: Target date: 04/04/2023    Patient will increase FOTO score to equal to or greater than   45  to demonstrate statistically significant improvement in mobility and quality of life.  Baseline: 32 Goal status: INITIAL  2.  Patient will reduce baseline pain level by 2 points to improve QoL and improve   Baseline: 8/10 Goal status: INITIAL  3.  Patient will increase walk test to >1038ft to demonstrate decreased fall risk  during functional activities and improved access to community Baseline: to be completed at later date.  Goal status: INITIAL  4.  Patient will increase 10 meter walk test to >1.18m/s as to improve gait speed for better community ambulation and to reduce fall risk. Baseline: 0.32m/s  Goal status: INITIAL  5.  Patient will reduce timed up and go to <11 seconds to reduce fall risk and demonstrate improved transfer/gait ability. Baseline: 17.14sec Goal status: INITIAL  6.  Patient will increase LLE to 4+/5 grossly to improve safety with community mobility and reduce risk of falls and independence with stair management.  Baseline: 4-/5 to 4/5 Goal status: INITIAL   PLAN:  PT FREQUENCY: 1-2x/week  PT DURATION: 12 weeks  PLANNED INTERVENTIONS: Therapeutic exercises, Therapeutic activity, Neuromuscular re-education, Balance training, Gait  training, Patient/Family education, Self Care, Joint mobilization, Joint manipulation, Stair training, DME instructions, Spinal manipulation, Spinal mobilization, Cryotherapy, Moist heat, scar mobilization, and Manual therapy.  PLAN FOR NEXT SESSION:   Core stabilization. STM for paraspinals in sitting. education on posture. Adjust HEP as needed  Norman Herrlich PT ,DPT Physical Therapist- [facility]   9:37 AM 01/23/23

## 2023-01-23 ENCOUNTER — Ambulatory Visit: Payer: BC Managed Care – PPO | Admitting: Physical Therapy

## 2023-01-23 DIAGNOSIS — M5416 Radiculopathy, lumbar region: Secondary | ICD-10-CM | POA: Diagnosis not present

## 2023-01-23 DIAGNOSIS — M6281 Muscle weakness (generalized): Secondary | ICD-10-CM | POA: Diagnosis not present

## 2023-01-23 DIAGNOSIS — M5459 Other low back pain: Secondary | ICD-10-CM

## 2023-01-23 DIAGNOSIS — R262 Difficulty in walking, not elsewhere classified: Secondary | ICD-10-CM | POA: Diagnosis not present

## 2023-01-25 ENCOUNTER — Ambulatory Visit: Payer: BC Managed Care – PPO | Admitting: Physical Therapy

## 2023-01-25 DIAGNOSIS — R262 Difficulty in walking, not elsewhere classified: Secondary | ICD-10-CM

## 2023-01-25 DIAGNOSIS — M5416 Radiculopathy, lumbar region: Secondary | ICD-10-CM | POA: Diagnosis not present

## 2023-01-25 DIAGNOSIS — M6281 Muscle weakness (generalized): Secondary | ICD-10-CM

## 2023-01-25 DIAGNOSIS — M5459 Other low back pain: Secondary | ICD-10-CM

## 2023-01-25 NOTE — Therapy (Signed)
OUTPATIENT PHYSICAL THERAPY THORACOLUMBAR TREATMENT   Patient Name: Fred Tran MRN: [id number] DOB:09/28/1965, 57 y.o., male Today's Date: 01/25/2023  END OF SESSION:  PT End of Session - 01/25/23 0909     Visit Number 5    Number of Visits 24    Date for PT Re-Evaluation 04/04/23    PT Start Time 0855    PT Stop Time 0935    PT Time Calculation (min) 40 min    Activity Tolerance Patient tolerated treatment well;Patient limited by pain    Behavior During Therapy [facility] for tasks assessed/performed              Past Medical History:  Diagnosis Date   Arthritis    Hyperlipidemia    Obesity (BMI 35.0-39.9 without comorbidity)    Tobacco abuse    Past Surgical History:  Procedure Laterality Date   ARTHRODESIS,ANTERIOR INTERBODY TECHNIQUE,INCLUDE MINIMAL DISKECTOMY TO PREPARE INTERSPACE; LUMBAR  01/29/2022   FINGER SURGERY Left    ring finger   KNEE SURGERY Right    Patient Active Problem List   Diagnosis Date Noted   Lumbar radiculopathy 12/08/2021   Other intervertebral disc degeneration, lumbar region 10/26/2020   Adjustment disorder with mixed anxiety and depressed mood 04/03/2019   Obesity (BMI 35.0-39.9 without comorbidity)    Tobacco abuse    Hyperlipidemia     PCP: Dorcas Carrow, DO   REFERRING PROVIDER: Dorcas Carrow, DO   REFERRING DIAG:  Diagnosis  M54.16 (ICD-10-CM) - Lumbar radiculopathy    Rationale for Evaluation and Treatment: Rehabilitation  THERAPY DIAG:  Other low back pain  Radiculopathy, lumbar region  Difficulty in walking, not elsewhere classified  Muscle weakness (generalized)  ONSET DATE: several years with multiple surgeries   SUBJECTIVE:                                                                                                                                                                                           SUBJECTIVE STATEMENT: Pt reports continued pain in his back and L hip. With occasional  shooting pain in the the R LE this morning.   PERTINENT HISTORY:  Fred Tran is a 57 y.o. male with a PMHx significant for arthritis and s/p L3-4 XLIF/revision PI on 01/29/22.  The lumbar spine examination shows posterior fusion from the level of C6 L4 to through L4. Disc spacers are noted at the level of L2-L3 and L3-L4  PAIN:  Are you having pain? Yes: NPRS scale: 8/10 Pain location: L hip and lower R back   Pain description: burning/stabbing   Aggravating factors: constant Relieving factors: sitting still  provides some short tern relief.   PRECAUTIONS: None and Other: history of back precautions following each surgery   WEIGHT BEARING RESTRICTIONS: No  FALLS:  Has patient fallen in last 6 months? Yes. Number of falls at least 6. Reports that L knee "gives out"  LIVING ENVIRONMENT: Lives with: lives with their spouse Lives in: Mobile home Stairs: Yes: External: 5 steps; on right going up, on left going up, and can reach both Has following equipment at home: Single point cane and has RW but does not use   OCCUPATION: disability   PLOF: Independent with household mobility without device and Requires assistive device for independence use of SPC in community   PATIENT GOALS: pain relief   NEXT MD VISIT: 02/11/2023 with Dr Laural Benes. 6/24: pain management follow up visit.   OBJECTIVE:   DIAGNOSTIC FINDINGS:  Imaging/Tests: MRI Cervical Thoracic Lumbar Spine 07/23/22 FINDINGS: The C2-C3 disc level is within normal limits.   The C3-C4 disc level shows hypertrophy of the left uncovertebral joint leading to moderate stenosis of at the corresponding neural foramen.   The C4-C5 level shows minimal posterior disc osteophyte formation but is otherwise within normal limits.   The C5-C6 level is degraded by motion from shoulders but shows posterior disc osteophyte formation with some compression of the ventral subarachnoid space and moderate narrowing of the neural foramina  bilaterally and symmetrically.   The C6 vertebral body shows high T2 signal intensity. C7 vertebral body shows similar findings in the disc space between these 2 vertebral bodies is somewhat bright. Presumably this is degenerative in nature. Axial images are degraded by artifact from shoulders and cannot be adequately evaluated but there appears to be posterior disc osteophyte formation resulting in a borderline spinal canal diameter.   The C7-T1 disc level appears to be within normal limits.   The structures at the level of the foramen magnum are normal. The signal intensity from the spinal cord is unremarkable.   Thoracic spine shows kyphosis as well as minimal anterior wedging of the T7 and possibly T8 vertebral bodies. There are posterior disc bulges indenting the ventral subarachnoid space extending from the level of T5-T6 to T8-T9. There is no significant spinal canal stenosis. The spinal cord appears to be somewhat diminished in its caliber but is otherwise within normal limits.   The lumbar spine examination shows posterior fusion from the level of C6 L4 to through L4. Disc spacers are noted at the level of L2-L3 and L3-L4. There is now an additional caudal level of fusion when compared to the previous examination.   Metal artifact obstructs most of the view through the region of the fusion. Please note that axial images were not obtained through the lumbar spine as the patient requested to be evacuated from the scanner. There is a posterior disc herniation at the level of L2-L3 which was present previously and is unchanged. There is now what appears to be a diffuse disc bulge with a protrusion especially to the left at the L4-L5 level which has increased in severity when compared to the prior study.  IMPRESSION Degenerative changes in the cervical spine with no evidence of significant spinal canal stenosis.   No evidence of spinal canal stenosis in the thoracic region.   Interval  additional level of fusion at the level of L3-L4 when compared to the prior study. Interval progression of degenerative changes at the level of L4-L5. Incomplete study.   XR Lumbar Spine 07/10/22 FINDINGS:  Redemonstration of posterior fusion  from L2-L4 with intervertebral disc spacers. No perihardware lucency or fracture. Unchanged alignment. Intervertebral disc space narrowing at L4-L5 and L5-S1 levels. Facetal arthropathy at L1-L2 and L5-S1 levels.  IMPRESSION Posterior fusion L2-L4 with intervertebral disc spacers. No interval/new hardware complications.  Moderate degenerative disease of the lumbar spine at the nonfused levels.    PATIENT SURVEYS:  Modified Oswestry 28/50 56%   FOTO 32  SCREENING FOR RED FLAGS: Bowel or bladder incontinence: No Spinal tumors: No Cauda equina syndrome: No Compression fracture: No Abdominal aneurysm: No  COGNITION: Overall cognitive status: Within functional limits for tasks assessed     SENSATION: Light touch: Impaired   MUSCLE LENGTH: Hamstrings: Right  deg; Left  deg Maisie Fus test: Right  deg; Left  deg  POSTURE: rounded shoulders and posterior pelvic tilt  PALPATION: Bil lumbar scars. Tigger point in Bil paraspinals from T8-SI  joint.   LUMBAR ROM:   AROM eval  Flexion Limited due to Fusion  Extension 5 deg  Right lateral flexion 12  Left lateral flexion 10  Right rotation   Left rotation    (Blank rows = not tested)  LOWER EXTREMITY ROM:     Active  Right eval Left eval  Hip flexion    Hip extension    Hip abduction    Hip adduction    Hip internal rotation    Hip external rotation    Knee flexion    Knee extension    Ankle dorsiflexion    Ankle plantarflexion    Ankle inversion    Ankle eversion     (Blank rows = not tested)  LOWER EXTREMITY MMT:    MMT Right eval Left eval  Hip flexion 4 4  Hip extension    Hip abduction 4+ 4  Hip adduction 4+ 4  Hip internal rotation    Hip external rotation    Knee  flexion 4+ 4-  Knee extension 4+ 4  Ankle dorsiflexion 4+ 4+   Ankle plantarflexion    Ankle inversion    Ankle eversion     (Blank rows = not tested)  LUMBAR SPECIAL TESTS:  Straight leg raise test: Positive and Slump test: Positive  FUNCTIONAL TESTS:  5 times sit to stand:  25.44 heavy use of BUE on arm rests  Timed up and go (TUG): 17.14 6 minute walk test: 435ft 1 standing break. Unable to complete 6 min; Pt required to sit at 3:44. 10 meter walk test: 13.76 sec (0.4m/s)   GAIT: Distance walked: 75 Assistive device utilized: Single point cane Level of assistance: Modified independence Comments: antalgic, hip hike on the LLE, increased stance time on the LLE with use of SPC.   TODAY'S TREATMENT:                                                                                                                              DATE: 01/25/2023  Pt reports inability to be in prone or supine  for any length of time.  therapy ball forward roll x 10 x 5 sec holds ant, and bilateral ( 8 ea)  Standing Hip flexor stretch 2 x 45 sec sec bil. Seated clamshell x15 reps with RTB Sitting single knee to chest stretch variation 2 x 30 sec holds ea LE  Core stabalization - seated trA activation with palpation of target ares 10 x 5 sec  Trunk extension over half bolster within pain free range x 15  Low row 2 x 15 with RTB sitting in chair.     PATIENT EDUCATION:  Education details: POC.  Pt educated throughout session about proper posture and technique with exercises. Improved exercise technique, movement at target joints, use of target muscles after min to mod verbal, visual, tactile cues.  Person educated: Patient Education method: Explanation Education comprehension: verbalized understanding and needs further education  HOME EXERCISE PROGRAM: Piriformis stretch 3 x 30 sec  Sciatic nerve glides 5 sec hold x 10 bil  Seated trunkal extension within pain free range 2 x 10    ASSESSMENT:  CLINICAL IMPRESSION: Patient present with good motivation for completion of physical therapy activities.  Continued with seated and standing BLE stretching and deep core activation for improved postural alignment. Pt noted mild decrease in pain from 8/10 at start of treatment to 7/10.  Pt will continue to benefit from skilled physical therapy intervention to address impairments, improve QOL, and attain therapy goals.    OBJECTIVE IMPAIRMENTS: Abnormal gait, cardiopulmonary status limiting activity, decreased activity tolerance, decreased balance, decreased endurance, decreased mobility, difficulty walking, decreased ROM, decreased strength, hypomobility, increased fascial restrictions, impaired perceived functional ability, impaired flexibility, impaired sensation, improper body mechanics, postural dysfunction, obesity, and pain.   ACTIVITY LIMITATIONS: carrying, lifting, bending, sitting, standing, and sleeping  PARTICIPATION LIMITATIONS: driving, shopping, community activity, and yard work  PERSONAL FACTORS: Age, Fitness, Past/current experiences, Social background, and 1 comorbidity: Arthritis   are also affecting patient's functional outcome.   REHAB POTENTIAL: Fair history or chronic LBP -  CLINICAL DECISION MAKING: Evolving/moderate complexity  EVALUATION COMPLEXITY: Moderate   GOALS:  SHORT TERM GOALS: Target date: 02/21/2023    Patient will be independent in home exercise program to improve strength/mobility for better functional independence with ADLs. Baseline:  Goal status: INITIAL   LONG TERM GOALS: Target date: 04/04/2023    Patient will increase FOTO score to equal to or greater than   45  to demonstrate statistically significant improvement in mobility and quality of life.  Baseline: 32 Goal status: INITIAL  2.  Patient will reduce baseline pain level by 2 points to improve QoL and improve   Baseline: 8/10 Goal status: INITIAL  3.  Patient  will increase walk test to >1066ft to demonstrate decreased fall risk during functional activities and improved access to community Baseline: to be completed at later date.  Goal status: INITIAL  4.  Patient will increase 10 meter walk test to >1.60m/s as to improve gait speed for better community ambulation and to reduce fall risk. Baseline: 0.37m/s  Goal status: INITIAL  5.  Patient will reduce timed up and go to <11 seconds to reduce fall risk and demonstrate improved transfer/gait ability. Baseline: 17.14sec Goal status: INITIAL  6.  Patient will increase LLE to 4+/5 grossly to improve safety with community mobility and reduce risk of falls and independence with stair management.  Baseline: 4-/5 to 4/5 Goal status: INITIAL   PLAN:  PT FREQUENCY: 1-2x/week  PT DURATION: 12 weeks  PLANNED  INTERVENTIONS: Therapeutic exercises, Therapeutic activity, Neuromuscular re-education, Balance training, Gait training, Patient/Family education, Self Care, Joint mobilization, Joint manipulation, Stair training, DME instructions, Spinal manipulation, Spinal mobilization, Cryotherapy, Moist heat, scar mobilization, and Manual therapy.  PLAN FOR NEXT SESSION:   Core stabilization. STM for paraspinals in sitting. education on posture. Adjust HEP as needed  Golden Pop PT ,DPT Physical Therapist- [facility]  11:11 AM 01/25/23

## 2023-01-28 ENCOUNTER — Ambulatory Visit: Payer: BC Managed Care – PPO

## 2023-01-28 DIAGNOSIS — M5416 Radiculopathy, lumbar region: Secondary | ICD-10-CM | POA: Diagnosis not present

## 2023-01-28 DIAGNOSIS — M6281 Muscle weakness (generalized): Secondary | ICD-10-CM

## 2023-01-28 DIAGNOSIS — M5459 Other low back pain: Secondary | ICD-10-CM | POA: Diagnosis not present

## 2023-01-28 DIAGNOSIS — R262 Difficulty in walking, not elsewhere classified: Secondary | ICD-10-CM | POA: Diagnosis not present

## 2023-01-28 NOTE — Therapy (Signed)
OUTPATIENT PHYSICAL THERAPY THORACOLUMBAR TREATMENT   Patient Name: Fred Tran MRN: [id number] DOB:03/14/1966, 57 y.o., male Today's Date: 01/28/2023  END OF SESSION:     Past Medical History:  Diagnosis Date   Arthritis    Hyperlipidemia    Obesity (BMI 35.0-39.9 without comorbidity)    Tobacco abuse    Past Surgical History:  Procedure Laterality Date   ARTHRODESIS,ANTERIOR INTERBODY TECHNIQUE,INCLUDE MINIMAL DISKECTOMY TO PREPARE INTERSPACE; LUMBAR  01/29/2022   FINGER SURGERY Left    ring finger   KNEE SURGERY Right    Patient Active Problem List   Diagnosis Date Noted   Lumbar radiculopathy 12/08/2021   Other intervertebral disc degeneration, lumbar region 10/26/2020   Adjustment disorder with mixed anxiety and depressed mood 04/03/2019   Obesity (BMI 35.0-39.9 without comorbidity)    Tobacco abuse    Hyperlipidemia     PCP: Dorcas Carrow, DO   REFERRING PROVIDER: Dorcas Carrow, DO   REFERRING DIAG:  Diagnosis  M54.16 (ICD-10-CM) - Lumbar radiculopathy    Rationale for Evaluation and Treatment: Rehabilitation  THERAPY DIAG:  No diagnosis found.  ONSET DATE: several years with multiple surgeries   SUBJECTIVE:                                                                                                                                                                                           SUBJECTIVE STATEMENT: Patient reports feeling better now than he did earlier this morning. States still very pain limited overall.   PERTINENT HISTORY:  Fred Tran is a 57 y.o. male with a PMHx significant for arthritis and s/p L3-4 XLIF/revision PI on 01/29/22.  The lumbar spine examination shows posterior fusion from the level of C6 L4 to through L4. Disc spacers are noted at the level of L2-L3 and L3-L4  PAIN:  Are you having pain? Yes: NPRS scale: 8/10 Pain location: L hip and lower R back   Pain description: burning/stabbing    Aggravating factors: constant Relieving factors: sitting still provides some short tern relief.   PRECAUTIONS: None and Other: history of back precautions following each surgery   WEIGHT BEARING RESTRICTIONS: No  FALLS:  Has patient fallen in last 6 months? Yes. Number of falls at least 6. Reports that L knee "gives out"  LIVING ENVIRONMENT: Lives with: lives with their spouse Lives in: Mobile home Stairs: Yes: External: 5 steps; on right going up, on left going up, and can reach both Has following equipment at home: Single point cane and has RW but does not use   OCCUPATION: disability   PLOF: Independent with household mobility without  device and Requires assistive device for independence use of SPC in community   PATIENT GOALS: pain relief   NEXT MD VISIT: 02/11/2023 with Dr Laural Benes. 6/24: pain management follow up visit.   OBJECTIVE:   DIAGNOSTIC FINDINGS:  Imaging/Tests: MRI Cervical Thoracic Lumbar Spine 07/23/22 FINDINGS: The C2-C3 disc level is within normal limits.   The C3-C4 disc level shows hypertrophy of the left uncovertebral joint leading to moderate stenosis of at the corresponding neural foramen.   The C4-C5 level shows minimal posterior disc osteophyte formation but is otherwise within normal limits.   The C5-C6 level is degraded by motion from shoulders but shows posterior disc osteophyte formation with some compression of the ventral subarachnoid space and moderate narrowing of the neural foramina bilaterally and symmetrically.   The C6 vertebral body shows high T2 signal intensity. C7 vertebral body shows similar findings in the disc space between these 2 vertebral bodies is somewhat bright. Presumably this is degenerative in nature. Axial images are degraded by artifact from shoulders and cannot be adequately evaluated but there appears to be posterior disc osteophyte formation resulting in a borderline spinal canal diameter.   The C7-T1 disc level  appears to be within normal limits.   The structures at the level of the foramen magnum are normal. The signal intensity from the spinal cord is unremarkable.   Thoracic spine shows kyphosis as well as minimal anterior wedging of the T7 and possibly T8 vertebral bodies. There are posterior disc bulges indenting the ventral subarachnoid space extending from the level of T5-T6 to T8-T9. There is no significant spinal canal stenosis. The spinal cord appears to be somewhat diminished in its caliber but is otherwise within normal limits.   The lumbar spine examination shows posterior fusion from the level of C6 L4 to through L4. Disc spacers are noted at the level of L2-L3 and L3-L4. There is now an additional caudal level of fusion when compared to the previous examination.   Metal artifact obstructs most of the view through the region of the fusion. Please note that axial images were not obtained through the lumbar spine as the patient requested to be evacuated from the scanner. There is a posterior disc herniation at the level of L2-L3 which was present previously and is unchanged. There is now what appears to be a diffuse disc bulge with a protrusion especially to the left at the L4-L5 level which has increased in severity when compared to the prior study.  IMPRESSION Degenerative changes in the cervical spine with no evidence of significant spinal canal stenosis.   No evidence of spinal canal stenosis in the thoracic region.   Interval additional level of fusion at the level of L3-L4 when compared to the prior study. Interval progression of degenerative changes at the level of L4-L5. Incomplete study.   XR Lumbar Spine 07/10/22 FINDINGS:  Redemonstration of posterior fusion from L2-L4 with intervertebral disc spacers. No perihardware lucency or fracture. Unchanged alignment. Intervertebral disc space narrowing at L4-L5 and L5-S1 levels. Facetal arthropathy at L1-L2 and L5-S1 levels.   IMPRESSION Posterior fusion L2-L4 with intervertebral disc spacers. No interval/new hardware complications.  Moderate degenerative disease of the lumbar spine at the nonfused levels.    PATIENT SURVEYS:  Modified Oswestry 28/50 56%   FOTO 32  SCREENING FOR RED FLAGS: Bowel or bladder incontinence: No Spinal tumors: No Cauda equina syndrome: No Compression fracture: No Abdominal aneurysm: No  COGNITION: Overall cognitive status: Within functional limits for tasks assessed  SENSATION: Light touch: Impaired   MUSCLE LENGTH: Hamstrings: Right  deg; Left  deg Maisie Fus test: Right  deg; Left  deg  POSTURE: rounded shoulders and posterior pelvic tilt  PALPATION: Bil lumbar scars. Tigger point in Bil paraspinals from T8-SI  joint.   LUMBAR ROM:   AROM eval  Flexion Limited due to Fusion  Extension 5 deg  Right lateral flexion 12  Left lateral flexion 10  Right rotation   Left rotation    (Blank rows = not tested)  LOWER EXTREMITY ROM:     Active  Right eval Left eval  Hip flexion    Hip extension    Hip abduction    Hip adduction    Hip internal rotation    Hip external rotation    Knee flexion    Knee extension    Ankle dorsiflexion    Ankle plantarflexion    Ankle inversion    Ankle eversion     (Blank rows = not tested)  LOWER EXTREMITY MMT:    MMT Right eval Left eval  Hip flexion 4 4  Hip extension    Hip abduction 4+ 4  Hip adduction 4+ 4  Hip internal rotation    Hip external rotation    Knee flexion 4+ 4-  Knee extension 4+ 4  Ankle dorsiflexion 4+ 4+   Ankle plantarflexion    Ankle inversion    Ankle eversion     (Blank rows = not tested)  LUMBAR SPECIAL TESTS:  Straight leg raise test: Positive and Slump test: Positive  FUNCTIONAL TESTS:  5 times sit to stand:  25.44 heavy use of BUE on arm rests  Timed up and go (TUG): 17.14 6 minute walk test: 489ft 1 standing break. Unable to complete 6 min; Pt required to sit at 3:44. 10  meter walk test: 13.76 sec (0.68m/s)   GAIT: Distance walked: 75 Assistive device utilized: Single point cane Level of assistance: Modified independence Comments: antalgic, hip hike on the LLE, increased stance time on the LLE with use of SPC.   TODAY'S TREATMENT:                                                                                                                              DATE: 01/28/2023  Pt reports inability to be in prone or supine for any length of time.   Seated hamstring stretch x 30 sec x 3 each LE Seated piriformis  hold 30 sec x 3 each LE Seated figure four - hold 30 sec x 3 each LE Seated lumbar flex with therapy ball forward roll x 10 x 5 sec holds ant, then biased to diagonal forward left and then right x 10 each. Standing hip march with TrA bracing x 10 reps each LE Standing calf raises with TrA bracing x 10 rep each LE Seated hamstring curls with TrA bracing - GTB x 10 reps each Seated LAQ- 3# AW with TrA bracing -  12 reps each Seated side step up/over 5# db x 10 reps each side.    PATIENT EDUCATION:  Education details: POC.  Pt educated throughout session about proper posture and technique with exercises. Improved exercise technique, movement at target joints, use of target muscles after min to mod verbal, visual, tactile cues.  Person educated: Patient Education method: Explanation Education comprehension: verbalized understanding and needs further education  HOME EXERCISE PROGRAM: Piriformis stretch 3 x 30 sec  Sciatic nerve glides 5 sec hold x 10 bil  Seated trunkal extension within pain free range 2 x 10   ASSESSMENT:  CLINICAL IMPRESSION: Patient responded well with self stretching instruction- able to perform and only complain of tightness vs any radicular pain. He was also able to incorporate some LE strengthening without significant report of any difficulty- only demo fatigue/weakness more that pain as limiting factor today.   Pt will  continue to benefit from skilled physical therapy intervention to address impairments, improve QOL, and attain therapy goals.    OBJECTIVE IMPAIRMENTS: Abnormal gait, cardiopulmonary status limiting activity, decreased activity tolerance, decreased balance, decreased endurance, decreased mobility, difficulty walking, decreased ROM, decreased strength, hypomobility, increased fascial restrictions, impaired perceived functional ability, impaired flexibility, impaired sensation, improper body mechanics, postural dysfunction, obesity, and pain.   ACTIVITY LIMITATIONS: carrying, lifting, bending, sitting, standing, and sleeping  PARTICIPATION LIMITATIONS: driving, shopping, community activity, and yard work  PERSONAL FACTORS: Age, Fitness, Past/current experiences, Social background, and 1 comorbidity: Arthritis   are also affecting patient's functional outcome.   REHAB POTENTIAL: Fair history or chronic LBP -  CLINICAL DECISION MAKING: Evolving/moderate complexity  EVALUATION COMPLEXITY: Moderate   GOALS:  SHORT TERM GOALS: Target date: 02/21/2023    Patient will be independent in home exercise program to improve strength/mobility for better functional independence with ADLs. Baseline:  Goal status: INITIAL   LONG TERM GOALS: Target date: 04/04/2023    Patient will increase FOTO score to equal to or greater than   45  to demonstrate statistically significant improvement in mobility and quality of life.  Baseline: 32 Goal status: INITIAL  2.  Patient will reduce baseline pain level by 2 points to improve QoL and improve   Baseline: 8/10 Goal status: INITIAL  3.  Patient will increase walk test to >1084ft to demonstrate decreased fall risk during functional activities and improved access to community Baseline: to be completed at later date.  Goal status: INITIAL  4.  Patient will increase 10 meter walk test to >1.61m/s as to improve gait speed for better community ambulation  and to reduce fall risk. Baseline: 0.58m/s  Goal status: INITIAL  5.  Patient will reduce timed up and go to <11 seconds to reduce fall risk and demonstrate improved transfer/gait ability. Baseline: 17.14sec Goal status: INITIAL  6.  Patient will increase LLE to 4+/5 grossly to improve safety with community mobility and reduce risk of falls and independence with stair management.  Baseline: 4-/5 to 4/5 Goal status: INITIAL   PLAN:  PT FREQUENCY: 1-2x/week  PT DURATION: 12 weeks  PLANNED INTERVENTIONS: Therapeutic exercises, Therapeutic activity, Neuromuscular re-education, Balance training, Gait training, Patient/Family education, Self Care, Joint mobilization, Joint manipulation, Stair training, DME instructions, Spinal manipulation, Spinal mobilization, Cryotherapy, Moist heat, scar mobilization, and Manual therapy.  PLAN FOR NEXT SESSION:   Core stabilization. STM for paraspinals in sitting. education on posture. Adjust HEP as needed  Lenda Kelp PT  Physical Therapist- [facility]  [facility]  7:46 AM 01/28/23

## 2023-01-30 ENCOUNTER — Ambulatory Visit: Payer: BC Managed Care – PPO | Admitting: Physical Therapy

## 2023-01-30 DIAGNOSIS — R262 Difficulty in walking, not elsewhere classified: Secondary | ICD-10-CM

## 2023-01-30 DIAGNOSIS — M5459 Other low back pain: Secondary | ICD-10-CM

## 2023-01-30 DIAGNOSIS — M5416 Radiculopathy, lumbar region: Secondary | ICD-10-CM | POA: Diagnosis not present

## 2023-01-30 DIAGNOSIS — M6281 Muscle weakness (generalized): Secondary | ICD-10-CM | POA: Diagnosis not present

## 2023-01-30 NOTE — Therapy (Signed)
OUTPATIENT PHYSICAL THERAPY THORACOLUMBAR TREATMENT   Patient Name: Fred Tran MRN: [id number] DOB:[birth year], 57 y.o., male Today's Date: 01/30/2023  END OF SESSION:  PT End of Session - 01/30/23 0807     Visit Number 7    Number of Visits 24    Date for PT Re-Evaluation 04/04/23    Progress Note Due on Visit 10    Activity Tolerance Patient tolerated treatment well;Patient limited by pain    Behavior During Therapy [facility] for tasks assessed/performed               Past Medical History:  Diagnosis Date   Arthritis    Hyperlipidemia    Obesity (BMI 35.0-39.9 without comorbidity)    Tobacco abuse    Past Surgical History:  Procedure Laterality Date   ARTHRODESIS,ANTERIOR INTERBODY TECHNIQUE,INCLUDE MINIMAL DISKECTOMY TO PREPARE INTERSPACE; LUMBAR  01/29/2022   FINGER SURGERY Left    ring finger   KNEE SURGERY Right    Patient Active Problem List   Diagnosis Date Noted   Lumbar radiculopathy 12/08/2021   Other intervertebral disc degeneration, lumbar region 10/26/2020   Adjustment disorder with mixed anxiety and depressed mood 04/03/2019   Obesity (BMI 35.0-39.9 without comorbidity)    Tobacco abuse    Hyperlipidemia     PCP: Dorcas Carrow, DO   REFERRING PROVIDER: Dorcas Carrow, DO   REFERRING DIAG:  Diagnosis  M54.16 (ICD-10-CM) - Lumbar radiculopathy    Rationale for Evaluation and Treatment: Rehabilitation  THERAPY DIAG:  Other low back pain  Difficulty in walking, not elsewhere classified  Muscle weakness (generalized)  ONSET DATE: several years with multiple surgeries   SUBJECTIVE:                                                                                                                                                                                           SUBJECTIVE STATEMENT: Pt reports increased pain this date secondary to pt walking a lot at [employer] graduation ceremony yesterday.   PERTINENT HISTORY:  Fred Tran is a 57 y.o. male with a PMHx significant for arthritis and s/p L3-4 XLIF/revision PI on 01/29/22.  The lumbar spine examination shows posterior fusion from the level of C6 L4 to through L4. Disc spacers are noted at the level of L2-L3 and L3-L4  PAIN:  Are you having pain? Yes: NPRS scale: 8/10 Pain location: L hip and lower R back   Pain description: burning/stabbing   Aggravating factors: constant Relieving factors: sitting still provides some short tern relief.   PRECAUTIONS: None and Other: history of back precautions following each surgery  WEIGHT BEARING RESTRICTIONS: No  FALLS:  Has patient fallen in last 6 months? Yes. Number of falls at least 6. Reports that L knee "gives out"  LIVING ENVIRONMENT: Lives with: lives with their spouse Lives in: Mobile home Stairs: Yes: External: 5 steps; on right going up, on left going up, and can reach both Has following equipment at home: Single point cane and has RW but does not use   OCCUPATION: disability   PLOF: Independent with household mobility without device and Requires assistive device for independence use of SPC in community   PATIENT GOALS: pain relief   NEXT MD VISIT: 02/11/2023 with Dr Laural Benes. 6/24: pain management follow up visit.   OBJECTIVE:   DIAGNOSTIC FINDINGS:  Imaging/Tests: MRI Cervical Thoracic Lumbar Spine 07/23/22 FINDINGS: The C2-C3 disc level is within normal limits.   The C3-C4 disc level shows hypertrophy of the left uncovertebral joint leading to moderate stenosis of at the corresponding neural foramen.   The C4-C5 level shows minimal posterior disc osteophyte formation but is otherwise within normal limits.   The C5-C6 level is degraded by motion from shoulders but shows posterior disc osteophyte formation with some compression of the ventral subarachnoid space and moderate narrowing of the neural foramina bilaterally and symmetrically.   The C6 vertebral body shows high T2 signal  intensity. C7 vertebral body shows similar findings in the disc space between these 2 vertebral bodies is somewhat bright. Presumably this is degenerative in nature. Axial images are degraded by artifact from shoulders and cannot be adequately evaluated but there appears to be posterior disc osteophyte formation resulting in a borderline spinal canal diameter.   The C7-T1 disc level appears to be within normal limits.   The structures at the level of the foramen magnum are normal. The signal intensity from the spinal cord is unremarkable.   Thoracic spine shows kyphosis as well as minimal anterior wedging of the T7 and possibly T8 vertebral bodies. There are posterior disc bulges indenting the ventral subarachnoid space extending from the level of T5-T6 to T8-T9. There is no significant spinal canal stenosis. The spinal cord appears to be somewhat diminished in its caliber but is otherwise within normal limits.   The lumbar spine examination shows posterior fusion from the level of C6 L4 to through L4. Disc spacers are noted at the level of L2-L3 and L3-L4. There is now an additional caudal level of fusion when compared to the previous examination.   Metal artifact obstructs most of the view through the region of the fusion. Please note that axial images were not obtained through the lumbar spine as the patient requested to be evacuated from the scanner. There is a posterior disc herniation at the level of L2-L3 which was present previously and is unchanged. There is now what appears to be a diffuse disc bulge with a protrusion especially to the left at the L4-L5 level which has increased in severity when compared to the prior study.  IMPRESSION Degenerative changes in the cervical spine with no evidence of significant spinal canal stenosis.   No evidence of spinal canal stenosis in the thoracic region.   Interval additional level of fusion at the level of L3-L4 when compared to the prior study.  Interval progression of degenerative changes at the level of L4-L5. Incomplete study.   XR Lumbar Spine 07/10/22 FINDINGS:  Redemonstration of posterior fusion from L2-L4 with intervertebral disc spacers. No perihardware lucency or fracture. Unchanged alignment. Intervertebral disc space narrowing at L4-L5 and  L5-S1 levels. Facetal arthropathy at L1-L2 and L5-S1 levels.  IMPRESSION Posterior fusion L2-L4 with intervertebral disc spacers. No interval/new hardware complications.  Moderate degenerative disease of the lumbar spine at the nonfused levels.    PATIENT SURVEYS:  Modified Oswestry 28/50 56%   FOTO 32  SCREENING FOR RED FLAGS: Bowel or bladder incontinence: No Spinal tumors: No Cauda equina syndrome: No Compression fracture: No Abdominal aneurysm: No  COGNITION: Overall cognitive status: Within functional limits for tasks assessed     SENSATION: Light touch: Impaired   MUSCLE LENGTH: Hamstrings: Right  deg; Left  deg Maisie Fus test: Right  deg; Left  deg  POSTURE: rounded shoulders and posterior pelvic tilt  PALPATION: Bil lumbar scars. Tigger point in Bil paraspinals from T8-SI  joint.   LUMBAR ROM:   AROM eval  Flexion Limited due to Fusion  Extension 5 deg  Right lateral flexion 12  Left lateral flexion 10  Right rotation   Left rotation    (Blank rows = not tested)  LOWER EXTREMITY ROM:     Active  Right eval Left eval  Hip flexion    Hip extension    Hip abduction    Hip adduction    Hip internal rotation    Hip external rotation    Knee flexion    Knee extension    Ankle dorsiflexion    Ankle plantarflexion    Ankle inversion    Ankle eversion     (Blank rows = not tested)  LOWER EXTREMITY MMT:    MMT Right eval Left eval  Hip flexion 4 4  Hip extension    Hip abduction 4+ 4  Hip adduction 4+ 4  Hip internal rotation    Hip external rotation    Knee flexion 4+ 4-  Knee extension 4+ 4  Ankle dorsiflexion 4+ 4+   Ankle  plantarflexion    Ankle inversion    Ankle eversion     (Blank rows = not tested)  LUMBAR SPECIAL TESTS:  Straight leg raise test: Positive and Slump test: Positive  FUNCTIONAL TESTS:  5 times sit to stand:  25.44 heavy use of BUE on arm rests  Timed up and go (TUG): 17.14 6 minute walk test: 430ft 1 standing break. Unable to complete 6 min; Pt required to sit at 3:44. 10 meter walk test: 13.76 sec (0.33m/s)   GAIT: Distance walked: 75 Assistive device utilized: Single point cane Level of assistance: Modified independence Comments: antalgic, hip hike on the LLE, increased stance time on the LLE with use of SPC.   TODAY'S TREATMENT:                                                                                                                              DATE: 01/30/2023  Pt reports inability to be in prone or supine for any length of time.   Standing knee to chest stretch using foot on second step and leaning toward  knee at hips. 2 x 30 sec ea side  Standing hip flexor stretch 2 x 30 sec ea LE Seated hamstring stretch x 30 sec x 2 each LE Seated piriformis  hold 30 sec x 2 each LE Seated lumbar flex with therapy ball forward roll x 5 x 5 sec holds ant, then biased to diagonal forward left and then right x 5 each. Standing hip march with TrA bracing x 10 reps each LE Standing sidestepping with red band around ankles 2 x 12 ea direction      PATIENT EDUCATION:  Education details: POC.  Pt educated throughout session about proper posture and technique with exercises. Improved exercise technique, movement at target joints, use of target muscles after min to mod verbal, visual, tactile cues.  Person educated: Patient Education method: Explanation Education comprehension: verbalized understanding and needs further education  HOME EXERCISE PROGRAM: Piriformis stretch 3 x 30 sec  Sciatic nerve glides 5 sec hold x 10 bil  Seated trunkal extension within pain free range 2 x 10    ASSESSMENT:  CLINICAL IMPRESSION:   Pt presents with good motivation for completion of PT activities.  Patient continues to work with seated and standing exercises as he cannot tolerate supine or prone positioning.  Patient continues to have abdominal improvement session this session we will continue to monitor this as patient continues to progress with exercises intensity. Pt will continue to benefit from skilled physical therapy intervention to address impairments, improve QOL, and attain therapy goals.     OBJECTIVE IMPAIRMENTS: Abnormal gait, cardiopulmonary status limiting activity, decreased activity tolerance, decreased balance, decreased endurance, decreased mobility, difficulty walking, decreased ROM, decreased strength, hypomobility, increased fascial restrictions, impaired perceived functional ability, impaired flexibility, impaired sensation, improper body mechanics, postural dysfunction, obesity, and pain.   ACTIVITY LIMITATIONS: carrying, lifting, bending, sitting, standing, and sleeping  PARTICIPATION LIMITATIONS: driving, shopping, community activity, and yard work  PERSONAL FACTORS: Age, Fitness, Past/current experiences, Social background, and 1 comorbidity: Arthritis   are also affecting patient's functional outcome.   REHAB POTENTIAL: Fair history or chronic LBP -  CLINICAL DECISION MAKING: Evolving/moderate complexity  EVALUATION COMPLEXITY: Moderate   GOALS:  SHORT TERM GOALS: Target date: 02/21/2023    Patient will be independent in home exercise program to improve strength/mobility for better functional independence with ADLs. Baseline:  Goal status: INITIAL   LONG TERM GOALS: Target date: 04/04/2023    Patient will increase FOTO score to equal to or greater than   45  to demonstrate statistically significant improvement in mobility and quality of life.  Baseline: 32 Goal status: INITIAL  2.  Patient will reduce baseline pain level by 2 points to  improve QoL and improve   Baseline: 8/10 Goal status: INITIAL  3.  Patient will increase walk test to >1017ft to demonstrate decreased fall risk during functional activities and improved access to community Baseline: to be completed at later date.  Goal status: INITIAL  4.  Patient will increase 10 meter walk test to >1.53m/s as to improve gait speed for better community ambulation and to reduce fall risk. Baseline: 0.5m/s  Goal status: INITIAL  5.  Patient will reduce timed up and go to <11 seconds to reduce fall risk and demonstrate improved transfer/gait ability. Baseline: 17.14sec Goal status: INITIAL  6.  Patient will increase LLE to 4+/5 grossly to improve safety with community mobility and reduce risk of falls and independence with stair management.  Baseline: 4-/5 to 4/5 Goal status: INITIAL  PLAN:  PT FREQUENCY: 1-2x/week  PT DURATION: 12 weeks  PLANNED INTERVENTIONS: Therapeutic exercises, Therapeutic activity, Neuromuscular re-education, Balance training, Gait training, Patient/Family education, Self Care, Joint mobilization, Joint manipulation, Stair training, DME instructions, Spinal manipulation, Spinal mobilization, Cryotherapy, Moist heat, scar mobilization, and Manual therapy.  PLAN FOR NEXT SESSION:   Core stabilization. STM for paraspinals in sitting. education on posture. Adjust HEP as needed  Norman Herrlich PT  Physical Therapist- [facility]  8:08 AM 01/30/23

## 2023-01-31 ENCOUNTER — Encounter: Payer: Self-pay | Admitting: Family Medicine

## 2023-01-31 ENCOUNTER — Ambulatory Visit: Payer: BC Managed Care – PPO | Admitting: Family Medicine

## 2023-01-31 VITALS — BP 138/78 | HR 79 | Temp 97.6°F | Wt 296.8 lb

## 2023-01-31 DIAGNOSIS — M5136 Other intervertebral disc degeneration, lumbar region: Secondary | ICD-10-CM

## 2023-01-31 NOTE — Assessment & Plan Note (Signed)
Still not doing any better. Continue to follow with pain management and PT. Will keep him out of work. Continue to follow with specialists.

## 2023-01-31 NOTE — Progress Notes (Signed)
BP 138/78 (BP Location: Left Arm, Cuff Size: Normal)   Pulse 79   Temp 97.6 F (36.4 C) (Oral)   Wt 296 lb 12.8 oz (134.6 kg)   SpO2 97%   BMI 39.76 kg/m    Subjective:    Patient ID: Fred Tran, male    DOB: Aug 06, 1966, 57 y.o.   MRN: [id number]  HPI: Fred Tran is a 57 y.o. male  Chief Complaint  Patient presents with   Back Pain   BACK PAIN- has started PT. No changes. Still not feeling well. Due to see pain management again tomorrow. Next step is probably a spinal cord stimulator and they are talking about doing that at the end of June Duration: chronic Mechanism of injury: unknown Location: bilateral and low back Onset: gradual Severity: severe Quality: aching, sore, numb Frequency: constant Radiation: down both legs Aggravating factors: lifting, moving, bending, working Alleviating factors: rest Status: stable Treatments attempted: surgery, cymbalta, lyrica, ice, heat, APAP, ibuprofen, aleve, PT    Relief with NSAIDs?: significant Nighttime pain:  yes Paresthesias / decreased sensation:  yes Bowel / bladder incontinence:  no Fevers:  no Dysuria / urinary frequency:  no   Relevant past medical, surgical, family and social history reviewed and updated as indicated. Interim medical history since our last visit reviewed. Allergies and medications reviewed and updated.  Review of Systems  Constitutional: Negative.   Respiratory: Negative.    Cardiovascular: Negative.   Gastrointestinal: Negative.   Genitourinary: Negative.   Musculoskeletal:  Positive for arthralgias, back pain and myalgias. Negative for gait problem, joint swelling, neck pain and neck stiffness.  Skin: Negative.   Neurological:  Positive for weakness and numbness. Negative for dizziness, tremors, seizures, syncope, facial asymmetry, speech difficulty, light-headedness and headaches.  Psychiatric/Behavioral: Negative.      Per HPI unless specifically indicated above      Objective:    BP 138/78 (BP Location: Left Arm, Cuff Size: Normal)   Pulse 79   Temp 97.6 F (36.4 C) (Oral)   Wt 296 lb 12.8 oz (134.6 kg)   SpO2 97%   BMI 39.76 kg/m   Wt Readings from Last 3 Encounters:  01/31/23 296 lb 12.8 oz (134.6 kg)  12/11/22 294 lb 14.4 oz (133.8 kg)  11/26/22 294 lb 1.6 oz (133.4 kg)    Physical Exam Vitals and nursing note reviewed.  Constitutional:      General: He is not in acute distress.    Appearance: Normal appearance. He is obese. He is not ill-appearing, toxic-appearing or diaphoretic.  HENT:     Head: Normocephalic and atraumatic.     Right Ear: External ear normal.     Left Ear: External ear normal.     Nose: Nose normal.     Mouth/Throat:     Mouth: Mucous membranes are moist.     Pharynx: Oropharynx is clear.  Eyes:     General: No scleral icterus.       Right eye: No discharge.        Left eye: No discharge.     Extraocular Movements: Extraocular movements intact.     Conjunctiva/sclera: Conjunctivae normal.     Pupils: Pupils are equal, round, and reactive to light.  Cardiovascular:     Rate and Rhythm: Normal rate and regular rhythm.     Pulses: Normal pulses.     Heart sounds: Normal heart sounds. No murmur heard.    No friction rub. No gallop.  Pulmonary:  Effort: Pulmonary effort is normal. No respiratory distress.     Breath sounds: Normal breath sounds. No stridor. No wheezing, rhonchi or rales.  Chest:     Chest wall: No tenderness.  Musculoskeletal:        General: Normal range of motion.     Cervical back: Normal range of motion and neck supple.  Skin:    General: Skin is warm and dry.     Capillary Refill: Capillary refill takes less than 2 seconds.     Coloration: Skin is not jaundiced or pale.     Findings: No bruising, erythema, lesion or rash.  Neurological:     General: No focal deficit present.     Mental Status: He is alert and oriented to person, place, and time. Mental status is at baseline.   Psychiatric:        Mood and Affect: Mood normal.        Behavior: Behavior normal.        Thought Content: Thought content normal.        Judgment: Judgment normal.     Results for orders placed or performed in visit on 12/08/21  Comprehensive metabolic panel  Result Value Ref Range   Glucose 106 (H) 70 - 99 mg/dL   BUN 9 6 - 24 mg/dL   Creatinine, Ser 2.13 0.76 - 1.27 mg/dL   eGFR 086 >57 QI/ONG/2.95   BUN/Creatinine Ratio 12 9 - 20   Sodium 140 134 - 144 mmol/L   Potassium 4.5 3.5 - 5.2 mmol/L   Chloride 103 96 - 106 mmol/L   CO2 22 20 - 29 mmol/L   Calcium 9.4 8.7 - 10.2 mg/dL   Total Protein 6.8 6.0 - 8.5 g/dL   Albumin 4.2 3.8 - 4.9 g/dL   Globulin, Total 2.6 1.5 - 4.5 g/dL   Albumin/Globulin Ratio 1.6 1.2 - 2.2   Bilirubin Total <0.2 0.0 - 1.2 mg/dL   Alkaline Phosphatase 95 44 - 121 IU/L   AST 21 0 - 40 IU/L   ALT 20 0 - 44 IU/L  CBC with Differential/Platelet  Result Value Ref Range   WBC 9.5 3.4 - 10.8 x10E3/uL   RBC 4.83 4.14 - 5.80 x10E6/uL   Hemoglobin 15.4 13.0 - 17.7 g/dL   Hematocrit 28.4 13.2 - 51.0 %   MCV 94 79 - 97 fL   MCH 31.9 26.6 - 33.0 pg   MCHC 34.0 31.5 - 35.7 g/dL   RDW 44.0 10.2 - 72.5 %   Platelets 294 150 - 450 x10E3/uL   Neutrophils 60 Not Estab. %   Lymphs 29 Not Estab. %   Monocytes 8 Not Estab. %   Eos 3 Not Estab. %   Basos 0 Not Estab. %   Neutrophils Absolute 5.7 1.4 - 7.0 x10E3/uL   Lymphocytes Absolute 2.7 0.7 - 3.1 x10E3/uL   Monocytes Absolute 0.7 0.1 - 0.9 x10E3/uL   EOS (ABSOLUTE) 0.3 0.0 - 0.4 x10E3/uL   Basophils Absolute 0.0 0.0 - 0.2 x10E3/uL   Immature Granulocytes 0 Not Estab. %   Immature Grans (Abs) 0.0 0.0 - 0.1 x10E3/uL  Lipid Panel w/o Chol/HDL Ratio  Result Value Ref Range   Cholesterol, Total 233 (H) 100 - 199 mg/dL   Triglycerides 366 (H) 0 - 149 mg/dL   HDL 43 >44 mg/dL   VLDL Cholesterol Cal 29 5 - 40 mg/dL   LDL Chol Calc (NIH) 034 (H) 0 - 99 mg/dL  PSA  Result Value Ref  Range   Prostate  Specific Ag, Serum 0.8 0.0 - 4.0 ng/mL  TSH  Result Value Ref Range   TSH 2.130 0.450 - 4.500 uIU/mL  Urinalysis, Routine w reflex microscopic  Result Value Ref Range   Specific Gravity, UA 1.015 1.005 - 1.030   pH, UA 8.0 (H) 5.0 - 7.5   Color, UA Yellow Yellow   Appearance Ur Clear Clear   Leukocytes,UA Negative Negative   Protein,UA Negative Negative/Trace   Glucose, UA Negative Negative   Ketones, UA Negative Negative   RBC, UA Negative Negative   Bilirubin, UA Negative Negative   Urobilinogen, Ur 0.2 0.2 - 1.0 mg/dL   Nitrite, UA Negative Negative      Assessment & Plan:   Problem List Items Addressed This Visit       Musculoskeletal and Integument   Other intervertebral disc degeneration, lumbar region - Primary    Still not doing any better. Continue to follow with pain management and PT. Will keep him out of work. Continue to follow with specialists.         Follow up plan: Return in about 2 months (around 04/02/2023).

## 2023-02-01 ENCOUNTER — Encounter: Payer: Self-pay | Admitting: Physical Therapy

## 2023-02-01 DIAGNOSIS — M5416 Radiculopathy, lumbar region: Secondary | ICD-10-CM | POA: Diagnosis not present

## 2023-02-05 NOTE — Therapy (Unsigned)
OUTPATIENT PHYSICAL THERAPY THORACOLUMBAR TREATMENT   Patient Name: Fred Tran MRN: [id number] DOB:[birth year], 57 y.o., male Today's Date: 02/06/2023  END OF SESSION:  PT End of Session - 02/06/23 0801     Visit Number 8    Number of Visits 24    Date for PT Re-Evaluation 04/04/23    Progress Note Due on Visit 10    PT Start Time 0801    PT Stop Time 0843    PT Time Calculation (min) 42 min    Activity Tolerance Patient tolerated treatment well;Patient limited by pain    Behavior During Therapy [facility] for tasks assessed/performed                Past Medical History:  Diagnosis Date   Arthritis    Hyperlipidemia    Obesity (BMI 35.0-39.9 without comorbidity)    Tobacco abuse    Past Surgical History:  Procedure Laterality Date   ARTHRODESIS,ANTERIOR INTERBODY TECHNIQUE,INCLUDE MINIMAL DISKECTOMY TO PREPARE INTERSPACE; LUMBAR  01/29/2022   FINGER SURGERY Left    ring finger   KNEE SURGERY Right    Patient Active Problem List   Diagnosis Date Noted   Lumbar radiculopathy 12/08/2021   Other intervertebral disc degeneration, lumbar region 10/26/2020   Adjustment disorder with mixed anxiety and depressed mood 04/03/2019   Obesity (BMI 35.0-39.9 without comorbidity)    Tobacco abuse    Hyperlipidemia     PCP: Fred Carrow, DO   REFERRING PROVIDER: Dorcas Carrow, DO   REFERRING DIAG:  Diagnosis  M54.16 (ICD-10-CM) - Lumbar radiculopathy    Rationale for Evaluation and Treatment: Rehabilitation  THERAPY DIAG:  Other low back pain  Radiculopathy, lumbar region  Muscle weakness (generalized)  ONSET DATE: several years with multiple surgeries   SUBJECTIVE:                                                                                                                                                                                           SUBJECTIVE STATEMENT: Pt reports he went to the MD and is starting a new medication (meloxicam), has  not mad a difference yet, he feels he will still end up needing a spine simulator after his next follow up visit with the MD.    PERTINENT HISTORY:  Fred Tran is a 57 y.o. male with a PMHx significant for arthritis and s/p L3-4 XLIF/revision PI on 01/29/22.  The lumbar spine examination shows posterior fusion from the level of C6 L4 to through L4. Disc spacers are noted at the level of L2-L3 and L3-L4  PAIN:  Are you having pain? Yes: NPRS  scale: 8/10 Pain location: L hip and lower R back   Pain description: burning/stabbing   Aggravating factors: constant Relieving factors: sitting still provides some short tern relief.   PRECAUTIONS: None and Other: history of back precautions following each surgery   WEIGHT BEARING RESTRICTIONS: No  FALLS:  Has patient fallen in last 6 months? Yes. Number of falls at least 6. Reports that L knee "gives out"  LIVING ENVIRONMENT: Lives with: lives with their spouse Lives in: Mobile home Stairs: Yes: External: 5 steps; on right going up, on left going up, and can reach both Has following equipment at home: Single point cane and has RW but does not use   OCCUPATION: disability   PLOF: Independent with household mobility without device and Requires assistive device for independence use of SPC in community   PATIENT GOALS: pain relief   NEXT MD VISIT: 02/11/2023 with Dr Fred Tran. 6/24: pain management follow up visit.   OBJECTIVE:   DIAGNOSTIC FINDINGS:  Imaging/Tests: MRI Cervical Thoracic Lumbar Spine 07/23/22 FINDINGS: The C2-C3 disc level is within normal limits.   The C3-C4 disc level shows hypertrophy of the left uncovertebral joint leading to moderate stenosis of at the corresponding neural foramen.   The C4-C5 level shows minimal posterior disc osteophyte formation but is otherwise within normal limits.   The C5-C6 level is degraded by motion from shoulders but shows posterior disc osteophyte formation with some compression  of the ventral subarachnoid space and moderate narrowing of the neural foramina bilaterally and symmetrically.   The C6 vertebral body shows high T2 signal intensity. C7 vertebral body shows similar findings in the disc space between these 2 vertebral bodies is somewhat bright. Presumably this is degenerative in nature. Axial images are degraded by artifact from shoulders and cannot be adequately evaluated but there appears to be posterior disc osteophyte formation resulting in a borderline spinal canal diameter.   The C7-T1 disc level appears to be within normal limits.   The structures at the level of the foramen magnum are normal. The signal intensity from the spinal cord is unremarkable.   Thoracic spine shows kyphosis as well as minimal anterior wedging of the T7 and possibly T8 vertebral bodies. There are posterior disc bulges indenting the ventral subarachnoid space extending from the level of T5-T6 to T8-T9. There is no significant spinal canal stenosis. The spinal cord appears to be somewhat diminished in its caliber but is otherwise within normal limits.   The lumbar spine examination shows posterior fusion from the level of C6 L4 to through L4. Disc spacers are noted at the level of L2-L3 and L3-L4. There is now an additional caudal level of fusion when compared to the previous examination.   Metal artifact obstructs most of the view through the region of the fusion. Please note that axial images were not obtained through the lumbar spine as the patient requested to be evacuated from the scanner. There is a posterior disc herniation at the level of L2-L3 which was present previously and is unchanged. There is now what appears to be a diffuse disc bulge with a protrusion especially to the left at the L4-L5 level which has increased in severity when compared to the prior study.  IMPRESSION Degenerative changes in the cervical spine with no evidence of significant spinal canal stenosis.   No  evidence of spinal canal stenosis in the thoracic region.   Interval additional level of fusion at the level of L3-L4 when compared to the prior study.  Interval progression of degenerative changes at the level of L4-L5. Incomplete study.   XR Lumbar Spine 07/10/22 FINDINGS:  Redemonstration of posterior fusion from L2-L4 with intervertebral disc spacers. No perihardware lucency or fracture. Unchanged alignment. Intervertebral disc space narrowing at L4-L5 and L5-S1 levels. Facetal arthropathy at L1-L2 and L5-S1 levels.  IMPRESSION Posterior fusion L2-L4 with intervertebral disc spacers. No interval/new hardware complications.  Moderate degenerative disease of the lumbar spine at the nonfused levels.    PATIENT SURVEYS:  Modified Oswestry 28/50 56%   FOTO 32  SCREENING FOR RED FLAGS: Bowel or bladder incontinence: No Spinal tumors: No Cauda equina syndrome: No Compression fracture: No Abdominal aneurysm: No  COGNITION: Overall cognitive status: Within functional limits for tasks assessed     SENSATION: Light touch: Impaired   MUSCLE LENGTH: Hamstrings: Right  deg; Left  deg Maisie Fus test: Right  deg; Left  deg  POSTURE: rounded shoulders and posterior pelvic tilt  PALPATION: Bil lumbar scars. Tigger point in Bil paraspinals from T8-SI  joint.   LUMBAR ROM:   AROM eval  Flexion Limited due to Fusion  Extension 5 deg  Right lateral flexion 12  Left lateral flexion 10  Right rotation   Left rotation    (Blank rows = not tested)  LOWER EXTREMITY ROM:     Active  Right eval Left eval  Hip flexion    Hip extension    Hip abduction    Hip adduction    Hip internal rotation    Hip external rotation    Knee flexion    Knee extension    Ankle dorsiflexion    Ankle plantarflexion    Ankle inversion    Ankle eversion     (Blank rows = not tested)  LOWER EXTREMITY MMT:    MMT Right eval Left eval  Hip flexion 4 4  Hip extension    Hip abduction 4+ 4  Hip  adduction 4+ 4  Hip internal rotation    Hip external rotation    Knee flexion 4+ 4-  Knee extension 4+ 4  Ankle dorsiflexion 4+ 4+   Ankle plantarflexion    Ankle inversion    Ankle eversion     (Blank rows = not tested)  LUMBAR SPECIAL TESTS:  Straight leg raise test: Positive and Slump test: Positive  FUNCTIONAL TESTS:  5 times sit to stand:  25.44 heavy use of BUE on arm rests  Timed up and go (TUG): 17.14 6 minute walk test: 446ft 1 standing break. Unable to complete 6 min; Pt required to sit at 3:44. 10 meter walk test: 13.76 sec (0.49m/s)   GAIT: Distance walked: 75 Assistive device utilized: Single point cane Level of assistance: Modified independence Comments: antalgic, hip hike on the LLE, increased stance time on the LLE with use of SPC.   TODAY'S TREATMENT:  DATE: 02/06/2023  Pt has inability to be in prone or supine for any length of time due to pain.   Standing knee to chest stretch using foot on second step and leaning toward knee at hips. 2 x 30 sec ea side  Standing hip flexor stretch 2 x 30 sec ea LE Seated hamstring stretch x 30 sec x 2 each LE Seated piriformis  hold 30 sec x 2 each LE Seated lumbar flex with therapy ball forward roll x 5 x 5 sec holds ant, then biased to diagonal forward left and then right x 5 each. Seated TrA bracing 10 x 5 sec holds  Standing hip march with TrA bracing x 10 reps each LE Tra Bracing with knee flexion x 10 ea Standing sidestepping with red band around ankles 2 x 12 ea direction  TrA activation with airex stance 5 x 10 sec holds no Ue support   Note: Portions of this document were prepared using Dragon voice recognition software and although reviewed may contain unintentional dictation errors in syntax, grammar, or spelling.    PATIENT EDUCATION:  Education details: POC.  Pt educated throughout  session about proper posture and technique with exercises. Improved exercise technique, movement at target joints, use of target muscles after min to mod verbal, visual, tactile cues.  Person educated: Patient Education method: Explanation Education comprehension: verbalized understanding and needs further education  HOME EXERCISE PROGRAM: Piriformis stretch 3 x 30 sec  Sciatic nerve glides 5 sec hold x 10 bil  Seated trunkal extension within pain free range 2 x 10   ASSESSMENT:  CLINICAL IMPRESSION:   Pt presents with good motivation for completion of PT activities.  Patient continues to work with seated and standing exercises as he cannot tolerate supine or prone positioning.  Patient continues to have nominal improvement session this session we will continue to monitor this as patient continues to progress with exercises intensity. Pt will continue to benefit from skilled physical therapy intervention to address impairments, improve QOL, and attain therapy goals.     OBJECTIVE IMPAIRMENTS: Abnormal gait, cardiopulmonary status limiting activity, decreased activity tolerance, decreased balance, decreased endurance, decreased mobility, difficulty walking, decreased ROM, decreased strength, hypomobility, increased fascial restrictions, impaired perceived functional ability, impaired flexibility, impaired sensation, improper body mechanics, postural dysfunction, obesity, and pain.   ACTIVITY LIMITATIONS: carrying, lifting, bending, sitting, standing, and sleeping  PARTICIPATION LIMITATIONS: driving, shopping, community activity, and yard work  PERSONAL FACTORS: Age, Fitness, Past/current experiences, Social background, and 1 comorbidity: Arthritis   are also affecting patient's functional outcome.   REHAB POTENTIAL: Fair history or chronic LBP -  CLINICAL DECISION MAKING: Evolving/moderate complexity  EVALUATION COMPLEXITY: Moderate   GOALS:  SHORT TERM GOALS: Target date:  02/21/2023    Patient will be independent in home exercise program to improve strength/mobility for better functional independence with ADLs. Baseline:  Goal status: INITIAL   LONG TERM GOALS: Target date: 04/04/2023    Patient will increase FOTO score to equal to or greater than   45  to demonstrate statistically significant improvement in mobility and quality of life.  Baseline: 32 Goal status: INITIAL  2.  Patient will reduce baseline pain level by 2 points to improve QoL and improve   Baseline: 8/10 Goal status: INITIAL  3.  Patient will increase walk test to >1029ft to demonstrate decreased fall risk during functional activities and improved access to community Baseline: to be completed at later date.  Goal status: INITIAL  4.  Patient  will increase 10 meter walk test to >1.54m/s as to improve gait speed for better community ambulation and to reduce fall risk. Baseline: 0.7m/s  Goal status: INITIAL  5.  Patient will reduce timed up and go to <11 seconds to reduce fall risk and demonstrate improved transfer/gait ability. Baseline: 17.14sec Goal status: INITIAL  6.  Patient will increase LLE to 4+/5 grossly to improve safety with community mobility and reduce risk of falls and independence with stair management.  Baseline: 4-/5 to 4/5 Goal status: INITIAL   PLAN:  PT FREQUENCY: 1-2x/week  PT DURATION: 12 weeks  PLANNED INTERVENTIONS: Therapeutic exercises, Therapeutic activity, Neuromuscular re-education, Balance training, Gait training, Patient/Family education, Self Care, Joint mobilization, Joint manipulation, Stair training, DME instructions, Spinal manipulation, Spinal mobilization, Cryotherapy, Moist heat, scar mobilization, and Manual therapy.  PLAN FOR NEXT SESSION:   Core stabilization. Education on posture. Adjust HEP as needed.   Norman Herrlich PT  Physical Therapist- [facility]  [facility]  10:51 AM 02/06/23

## 2023-02-06 ENCOUNTER — Ambulatory Visit: Payer: BC Managed Care – PPO | Admitting: Physical Therapy

## 2023-02-06 ENCOUNTER — Encounter: Payer: Self-pay | Admitting: Physical Therapy

## 2023-02-06 DIAGNOSIS — M5416 Radiculopathy, lumbar region: Secondary | ICD-10-CM

## 2023-02-06 DIAGNOSIS — R262 Difficulty in walking, not elsewhere classified: Secondary | ICD-10-CM | POA: Diagnosis not present

## 2023-02-06 DIAGNOSIS — M5459 Other low back pain: Secondary | ICD-10-CM

## 2023-02-06 DIAGNOSIS — M6281 Muscle weakness (generalized): Secondary | ICD-10-CM

## 2023-02-07 ENCOUNTER — Telehealth: Payer: Self-pay | Admitting: Family Medicine

## 2023-02-07 NOTE — Telephone Encounter (Signed)
Copied from CRM [id number]. Topic: General - Other >> Feb 06, 2023  3:43 PM Dominique A wrote: Reason for CRM: Pt states that his insurance company is needing a copy of his notes from is last office visit sent over to them. Per pt his PCP has done this before, he does not have a fax number for the company. Per pt the insurance company name is [employer]. Please advise. >> Feb 06, 2023  3:57 PM Macon Large wrote: Pt requests that the fax be sent to fax# [phone] >> Feb 06, 2023  3:48 PM Dondra Prader A wrote: *notes from his last office visit.

## 2023-02-08 ENCOUNTER — Encounter: Payer: Self-pay | Admitting: Physical Therapy

## 2023-02-08 ENCOUNTER — Ambulatory Visit: Payer: BC Managed Care – PPO | Admitting: Physical Therapy

## 2023-02-08 DIAGNOSIS — M5416 Radiculopathy, lumbar region: Secondary | ICD-10-CM | POA: Diagnosis not present

## 2023-02-08 DIAGNOSIS — R262 Difficulty in walking, not elsewhere classified: Secondary | ICD-10-CM | POA: Diagnosis not present

## 2023-02-08 DIAGNOSIS — M6281 Muscle weakness (generalized): Secondary | ICD-10-CM

## 2023-02-08 DIAGNOSIS — M5459 Other low back pain: Secondary | ICD-10-CM | POA: Diagnosis not present

## 2023-02-08 NOTE — Telephone Encounter (Signed)
Most recent clinical note was faxed over to fax contact information provided by patient was sent over to [phone].

## 2023-02-08 NOTE — Therapy (Signed)
OUTPATIENT PHYSICAL THERAPY THORACOLUMBAR TREATMENT   Patient Name: Fred Tran MRN: [id number] DOB:[birth year], 57 y.o., male Today's Date: 02/08/2023  END OF SESSION:  PT End of Session - 02/08/23 0821     Visit Number 9    Number of Visits 24    Date for PT Re-Evaluation 04/04/23    Progress Note Due on Visit 10    PT Start Time 0802    PT Stop Time 0843    PT Time Calculation (min) 41 min    Activity Tolerance Patient tolerated treatment well;Patient limited by pain    Behavior During Therapy [facility] for tasks assessed/performed                 Past Medical History:  Diagnosis Date   Arthritis    Hyperlipidemia    Obesity (BMI 35.0-39.9 without comorbidity)    Tobacco abuse    Past Surgical History:  Procedure Laterality Date   ARTHRODESIS,ANTERIOR INTERBODY TECHNIQUE,INCLUDE MINIMAL DISKECTOMY TO PREPARE INTERSPACE; LUMBAR  01/29/2022   FINGER SURGERY Left    ring finger   KNEE SURGERY Right    Patient Active Problem List   Diagnosis Date Noted   Lumbar radiculopathy 12/08/2021   Other intervertebral disc degeneration, lumbar region 10/26/2020   Adjustment disorder with mixed anxiety and depressed mood 04/03/2019   Obesity (BMI 35.0-39.9 without comorbidity)    Tobacco abuse    Hyperlipidemia     PCP: Dorcas Carrow, DO   REFERRING PROVIDER: Dorcas Carrow, DO   REFERRING DIAG:  Diagnosis  M54.16 (ICD-10-CM) - Lumbar radiculopathy    Rationale for Evaluation and Treatment: Rehabilitation  THERAPY DIAG:  Other low back pain  Radiculopathy, lumbar region  Muscle weakness (generalized)  Difficulty in walking, not elsewhere classified  ONSET DATE: several years with multiple surgeries   SUBJECTIVE:                                                                                                                                                                                           SUBJECTIVE STATEMENT: Pt reports pain at 8/10  today, mentions the stretches from HEP have been helping to reduce pain. Patient reports pain on opposite hip during TpR release. Patient reports minimal change in pain level at end of session.   PERTINENT HISTORY:  Fred Tran is a 57 y.o. male with a PMHx significant for arthritis and s/p L3-4 XLIF/revision PI on 01/29/22.  The lumbar spine examination shows posterior fusion from the level of C6 L4 to through L4. Disc spacers are noted at the level of L2-L3 and L3-L4  PAIN:  Are you  having pain? Yes: NPRS scale: 8/10 Pain location: L hip and lower R back   Pain description: burning/stabbing   Aggravating factors: constant Relieving factors: sitting still provides some short tern relief.   PRECAUTIONS: None and Other: history of back precautions following each surgery   WEIGHT BEARING RESTRICTIONS: No  FALLS:  Has patient fallen in last 6 months? Yes. Number of falls at least 6. Reports that L knee "gives out"  LIVING ENVIRONMENT: Lives with: lives with their spouse Lives in: Mobile home Stairs: Yes: External: 5 steps; on right going up, on left going up, and can reach both Has following equipment at home: Single point cane and has RW but does not use   OCCUPATION: disability   PLOF: Independent with household mobility without device and Requires assistive device for independence use of SPC in community   PATIENT GOALS: pain relief   NEXT MD VISIT: 02/11/2023 with Dr Laural Benes. 6/24: pain management follow up visit.   OBJECTIVE:   DIAGNOSTIC FINDINGS:  Imaging/Tests: MRI Cervical Thoracic Lumbar Spine 07/23/22 FINDINGS: The C2-C3 disc level is within normal limits.   The C3-C4 disc level shows hypertrophy of the left uncovertebral joint leading to moderate stenosis of at the corresponding neural foramen.   The C4-C5 level shows minimal posterior disc osteophyte formation but is otherwise within normal limits.   The C5-C6 level is degraded by motion from shoulders  but shows posterior disc osteophyte formation with some compression of the ventral subarachnoid space and moderate narrowing of the neural foramina bilaterally and symmetrically.   The C6 vertebral body shows high T2 signal intensity. C7 vertebral body shows similar findings in the disc space between these 2 vertebral bodies is somewhat bright. Presumably this is degenerative in nature. Axial images are degraded by artifact from shoulders and cannot be adequately evaluated but there appears to be posterior disc osteophyte formation resulting in a borderline spinal canal diameter.   The C7-T1 disc level appears to be within normal limits.   The structures at the level of the foramen magnum are normal. The signal intensity from the spinal cord is unremarkable.   Thoracic spine shows kyphosis as well as minimal anterior wedging of the T7 and possibly T8 vertebral bodies. There are posterior disc bulges indenting the ventral subarachnoid space extending from the level of T5-T6 to T8-T9. There is no significant spinal canal stenosis. The spinal cord appears to be somewhat diminished in its caliber but is otherwise within normal limits.   The lumbar spine examination shows posterior fusion from the level of C6 L4 to through L4. Disc spacers are noted at the level of L2-L3 and L3-L4. There is now an additional caudal level of fusion when compared to the previous examination.   Metal artifact obstructs most of the view through the region of the fusion. Please note that axial images were not obtained through the lumbar spine as the patient requested to be evacuated from the scanner. There is a posterior disc herniation at the level of L2-L3 which was present previously and is unchanged. There is now what appears to be a diffuse disc bulge with a protrusion especially to the left at the L4-L5 level which has increased in severity when compared to the prior study.  IMPRESSION Degenerative changes in the cervical  spine with no evidence of significant spinal canal stenosis.   No evidence of spinal canal stenosis in the thoracic region.   Interval additional level of fusion at the level of L3-L4 when compared  to the prior study. Interval progression of degenerative changes at the level of L4-L5. Incomplete study.   XR Lumbar Spine 07/10/22 FINDINGS:  Redemonstration of posterior fusion from L2-L4 with intervertebral disc spacers. No perihardware lucency or fracture. Unchanged alignment. Intervertebral disc space narrowing at L4-L5 and L5-S1 levels. Facetal arthropathy at L1-L2 and L5-S1 levels.  IMPRESSION Posterior fusion L2-L4 with intervertebral disc spacers. No interval/new hardware complications.  Moderate degenerative disease of the lumbar spine at the nonfused levels.    PATIENT SURVEYS:  Modified Oswestry 28/50 56%   FOTO 32  SCREENING FOR RED FLAGS: Bowel or bladder incontinence: No Spinal tumors: No Cauda equina syndrome: No Compression fracture: No Abdominal aneurysm: No  COGNITION: Overall cognitive status: Within functional limits for tasks assessed     SENSATION: Light touch: Impaired   MUSCLE LENGTH: Hamstrings: Right  deg; Left  deg Maisie Fus test: Right  deg; Left  deg  POSTURE: rounded shoulders and posterior pelvic tilt  PALPATION: Bil lumbar scars. Tigger point in Bil paraspinals from T8-SI  joint.   LUMBAR ROM:   AROM eval  Flexion Limited due to Fusion  Extension 5 deg  Right lateral flexion 12  Left lateral flexion 10  Right rotation   Left rotation    (Blank rows = not tested)  LOWER EXTREMITY ROM:     Active  Right eval Left eval  Hip flexion    Hip extension    Hip abduction    Hip adduction    Hip internal rotation    Hip external rotation    Knee flexion    Knee extension    Ankle dorsiflexion    Ankle plantarflexion    Ankle inversion    Ankle eversion     (Blank rows = not tested)  LOWER EXTREMITY MMT:    MMT Right eval  Left eval  Hip flexion 4 4  Hip extension    Hip abduction 4+ 4  Hip adduction 4+ 4  Hip internal rotation    Hip external rotation    Knee flexion 4+ 4-  Knee extension 4+ 4  Ankle dorsiflexion 4+ 4+   Ankle plantarflexion    Ankle inversion    Ankle eversion     (Blank rows = not tested)  LUMBAR SPECIAL TESTS:  Straight leg raise test: Positive and Slump test: Positive  FUNCTIONAL TESTS:  5 times sit to stand:  25.44 heavy use of BUE on arm rests  Timed up and go (TUG): 17.14 6 minute walk test: 467ft 1 standing break. Unable to complete 6 min; Pt required to sit at 3:44. 10 meter walk test: 13.76 sec (0.79m/s)   GAIT: Distance walked: 75 Assistive device utilized: Single point cane Level of assistance: Modified independence Comments: antalgic, hip hike on the LLE, increased stance time on the LLE with use of SPC.   TODAY'S TREATMENT:  DATE: 02/08/2023  Pt has inability to be in prone or supine for any length of time     Therapeutic Exercise: Standing knee to chest stretch using foot on second step and leaning toward knee at hips. 2 x 45 sec ea side  Right side-lying clam shell x 10, pillow support between legs, with L posterior-lateral gluteal TpR release with motion. Seated piriformis hold 30 sec x 2 each LE Seated hamstring stretch x 30 sec x 2 each LE Seated lumbar flex with therapy ball forward roll x 5 x 5 sec holds ant, then biased to diagonal forward left and then right x 5 each.  Manual Therapy:  Seated TpR release at L hip, 30-45 sec holds of pressure. Followed with circumferential massage. Multiple trigger points noted during ischemic TpR release. Right Side-lying L lateral-posterior gluteal STM, followed with IA STM in same location.  Note: Portions of this document were prepared using Dragon voice recognition software and although  reviewed may contain unintentional dictation errors in syntax, grammar, or spelling.    PATIENT EDUCATION:  Education details: POC.  Pt educated throughout session about proper posture and technique with exercises. Improved exercise technique, movement at target joints, use of target muscles after min to mod verbal, visual, tactile cues.  Person educated: Patient Education method: Explanation Education comprehension: verbalized understanding and needs further education  HOME EXERCISE PROGRAM: Piriformis stretch 3 x 30 sec  Sciatic nerve glides 5 sec hold x 10 bil  Seated truncal extension within pain free range 2 x 10   ASSESSMENT:  CLINICAL IMPRESSION:   Pt presents with good motivation for completion of PT activities.  Patient continues to work with seated and standing exercises; able to tolerate side-lying with pillow support at head and between legs. Patient continues to have nominal improvement session to session; we will continue to monitor this as patient continues to progress with exercises intensity. Pt will continue to benefit from skilled physical therapy intervention to address impairments, improve QOL, and attain therapy goals.     OBJECTIVE IMPAIRMENTS: Abnormal gait, cardiopulmonary status limiting activity, decreased activity tolerance, decreased balance, decreased endurance, decreased mobility, difficulty walking, decreased ROM, decreased strength, hypomobility, increased fascial restrictions, impaired perceived functional ability, impaired flexibility, impaired sensation, improper body mechanics, postural dysfunction, obesity, and pain.   ACTIVITY LIMITATIONS: carrying, lifting, bending, sitting, standing, and sleeping  PARTICIPATION LIMITATIONS: driving, shopping, community activity, and yard work  PERSONAL FACTORS: Age, Fitness, Past/current experiences, Social background, and 1 comorbidity: Arthritis   are also affecting patient's functional outcome.   REHAB  POTENTIAL: Fair history or chronic LBP -  CLINICAL DECISION MAKING: Evolving/moderate complexity  EVALUATION COMPLEXITY: Moderate   GOALS:  SHORT TERM GOALS: Target date: 02/21/2023    Patient will be independent in home exercise program to improve strength/mobility for better functional independence with ADLs. Baseline:  Goal status: INITIAL   LONG TERM GOALS: Target date: 04/04/2023    Patient will increase FOTO score to equal to or greater than   45  to demonstrate statistically significant improvement in mobility and quality of life.  Baseline: 32 Goal status: INITIAL  2.  Patient will reduce baseline pain level by 2 points to improve QoL and improve   Baseline: 8/10 Goal status: INITIAL  3.  Patient will increase walk test to >1065ft to demonstrate decreased fall risk during functional activities and improved access to community Baseline: to be completed at later date.  Goal status: INITIAL  4.  Patient will increase 10 meter  walk test to >1.10m/s as to improve gait speed for better community ambulation and to reduce fall risk. Baseline: 0.27m/s  Goal status: INITIAL  5.  Patient will reduce timed up and go to <11 seconds to reduce fall risk and demonstrate improved transfer/gait ability. Baseline: 17.14sec Goal status: INITIAL  6.  Patient will increase LLE to 4+/5 grossly to improve safety with community mobility and reduce risk of falls and independence with stair management.  Baseline: 4-/5 to 4/5 Goal status: INITIAL   PLAN:  PT FREQUENCY: 1-2x/week  PT DURATION: 12 weeks  PLANNED INTERVENTIONS: Therapeutic exercises, Therapeutic activity, Neuromuscular re-education, Balance training, Gait training, Patient/Family education, Self Care, Joint mobilization, Joint manipulation, Stair training, DME instructions, Spinal manipulation, Spinal mobilization, Cryotherapy, Moist heat, scar mobilization, and Manual therapy.  PLAN FOR NEXT SESSION:   Core  stabilization. Education on posture. Adjust HEP as needed. Progress note next visit   Norman Herrlich PT  Physical Therapist- [facility]  [facility]  11:41 AM 02/08/23

## 2023-02-09 DIAGNOSIS — M5416 Radiculopathy, lumbar region: Secondary | ICD-10-CM | POA: Diagnosis not present

## 2023-02-10 DIAGNOSIS — M5416 Radiculopathy, lumbar region: Secondary | ICD-10-CM | POA: Diagnosis not present

## 2023-02-11 ENCOUNTER — Ambulatory Visit: Payer: BC Managed Care – PPO | Admitting: Family Medicine

## 2023-02-12 NOTE — Therapy (Signed)
OUTPATIENT PHYSICAL THERAPY THORACOLUMBAR TREATMENT/Physical Therapy Progress Note   Dates of reporting period  01/10/2023   to   02/13/2023   Patient Name: Fred Tran MRN: [id number] DOB:08/20/1966, 57 y.o., male Today's Date: 02/13/2023  END OF SESSION:  PT End of Session - 02/13/23 0900     Visit Number 10    Number of Visits 24    Date for PT Re-Evaluation 04/04/23    Progress Note Due on Visit 10    PT Start Time 0855    PT Stop Time 0930    PT Time Calculation (min) 35 min    Activity Tolerance Patient tolerated treatment well;Patient limited by pain    Behavior During Therapy WFL for tasks assessed/performed                  Past Medical History:  Diagnosis Date   Arthritis    Hyperlipidemia    Obesity (BMI 35.0-39.9 without comorbidity)    Tobacco abuse    Past Surgical History:  Procedure Laterality Date   ARTHRODESIS,ANTERIOR INTERBODY TECHNIQUE,INCLUDE MINIMAL DISKECTOMY TO PREPARE INTERSPACE; LUMBAR  01/29/2022   FINGER SURGERY Left    ring finger   KNEE SURGERY Right    Patient Active Problem List   Diagnosis Date Noted   Lumbar radiculopathy 12/08/2021   Other intervertebral disc degeneration, lumbar region 10/26/2020   Adjustment disorder with mixed anxiety and depressed mood 04/03/2019   Obesity (BMI 35.0-39.9 without comorbidity)    Tobacco abuse    Hyperlipidemia     PCP: Dorcas Carrow, DO   REFERRING PROVIDER: Dorcas Carrow, DO   REFERRING DIAG:  Diagnosis  M54.16 (ICD-10-CM) - Lumbar radiculopathy    Rationale for Evaluation and Treatment: Rehabilitation  THERAPY DIAG:  Other low back pain  Radiculopathy, lumbar region  Muscle weakness (generalized)  Difficulty in walking, not elsewhere classified  ONSET DATE: several years with multiple surgeries   SUBJECTIVE:                                                                                                                                                                                            SUBJECTIVE STATEMENT: Patient reports feeling about the same but states his right low back/buttock has been bothering him some.Reports pain still around 8/10 today.   PERTINENT HISTORY:  Fred Tran is a 57 y.o. male with a PMHx significant for arthritis and s/p L3-4 XLIF/revision PI on 01/29/22.  The lumbar spine examination shows posterior fusion from the level of C6 L4 to through L4. Disc spacers are noted at the level of L2-L3 and L3-L4  PAIN:  Are you having pain? Yes: NPRS scale: 8/10 Pain location: L hip and lower R back   Pain description: burning/stabbing   Aggravating factors: constant Relieving factors: sitting still provides some short tern relief.   PRECAUTIONS: None and Other: history of back precautions following each surgery   WEIGHT BEARING RESTRICTIONS: No  FALLS:  Has patient fallen in last 6 months? Yes. Number of falls at least 6. Reports that L knee "gives out"  LIVING ENVIRONMENT: Lives with: lives with their spouse Lives in: Mobile home Stairs: Yes: External: 5 steps; on right going up, on left going up, and can reach both Has following equipment at home: Single point cane and has RW but does not use   OCCUPATION: disability   PLOF: Independent with household mobility without device and Requires assistive device for independence use of SPC in community   PATIENT GOALS: pain relief   NEXT MD VISIT: 02/11/2023 with Dr Laural Benes. 6/24: pain management follow up visit.   OBJECTIVE:   DIAGNOSTIC FINDINGS:  Imaging/Tests: MRI Cervical Thoracic Lumbar Spine 07/23/22 FINDINGS: The C2-C3 disc level is within normal limits.   The C3-C4 disc level shows hypertrophy of the left uncovertebral joint leading to moderate stenosis of at the corresponding neural foramen.   The C4-C5 level shows minimal posterior disc osteophyte formation but is otherwise within normal limits.   The C5-C6 level is degraded by motion from  shoulders but shows posterior disc osteophyte formation with some compression of the ventral subarachnoid space and moderate narrowing of the neural foramina bilaterally and symmetrically.   The C6 vertebral body shows high T2 signal intensity. C7 vertebral body shows similar findings in the disc space between these 2 vertebral bodies is somewhat bright. Presumably this is degenerative in nature. Axial images are degraded by artifact from shoulders and cannot be adequately evaluated but there appears to be posterior disc osteophyte formation resulting in a borderline spinal canal diameter.   The C7-T1 disc level appears to be within normal limits.   The structures at the level of the foramen magnum are normal. The signal intensity from the spinal cord is unremarkable.   Thoracic spine shows kyphosis as well as minimal anterior wedging of the T7 and possibly T8 vertebral bodies. There are posterior disc bulges indenting the ventral subarachnoid space extending from the level of T5-T6 to T8-T9. There is no significant spinal canal stenosis. The spinal cord appears to be somewhat diminished in its caliber but is otherwise within normal limits.   The lumbar spine examination shows posterior fusion from the level of C6 L4 to through L4. Disc spacers are noted at the level of L2-L3 and L3-L4. There is now an additional caudal level of fusion when compared to the previous examination.   Metal artifact obstructs most of the view through the region of the fusion. Please note that axial images were not obtained through the lumbar spine as the patient requested to be evacuated from the scanner. There is a posterior disc herniation at the level of L2-L3 which was present previously and is unchanged. There is now what appears to be a diffuse disc bulge with a protrusion especially to the left at the L4-L5 level which has increased in severity when compared to the prior study.  IMPRESSION Degenerative changes in the  cervical spine with no evidence of significant spinal canal stenosis.   No evidence of spinal canal stenosis in the thoracic region.   Interval additional level of fusion at the level of L3-L4  when compared to the prior study. Interval progression of degenerative changes at the level of L4-L5. Incomplete study.   XR Lumbar Spine 07/10/22 FINDINGS:  Redemonstration of posterior fusion from L2-L4 with intervertebral disc spacers. No perihardware lucency or fracture. Unchanged alignment. Intervertebral disc space narrowing at L4-L5 and L5-S1 levels. Facetal arthropathy at L1-L2 and L5-S1 levels.  IMPRESSION Posterior fusion L2-L4 with intervertebral disc spacers. No interval/new hardware complications.  Moderate degenerative disease of the lumbar spine at the nonfused levels.    PATIENT SURVEYS:  Modified Oswestry 28/50 56%   FOTO 32  SCREENING FOR RED FLAGS: Bowel or bladder incontinence: No Spinal tumors: No Cauda equina syndrome: No Compression fracture: No Abdominal aneurysm: No  COGNITION: Overall cognitive status: Within functional limits for tasks assessed     SENSATION: Light touch: Impaired   MUSCLE LENGTH: Hamstrings: Right  deg; Left  deg Fred Tran test: Right  deg; Left  deg  POSTURE: rounded shoulders and posterior pelvic tilt  PALPATION: Bil lumbar scars. Tigger point in Bil paraspinals from T8-SI  joint.   LUMBAR ROM:   AROM eval  Flexion Limited due to Fusion  Extension 5 deg  Right lateral flexion 12  Left lateral flexion 10  Right rotation   Left rotation    (Blank rows = not tested)  LOWER EXTREMITY ROM:     Active  Right eval Left eval  Hip flexion    Hip extension    Hip abduction    Hip adduction    Hip internal rotation    Hip external rotation    Knee flexion    Knee extension    Ankle dorsiflexion    Ankle plantarflexion    Ankle inversion    Ankle eversion     (Blank rows = not tested)  LOWER EXTREMITY MMT:    MMT Right eval  Left eval  Hip flexion 4 4  Hip extension    Hip abduction 4+ 4  Hip adduction 4+ 4  Hip internal rotation    Hip external rotation    Knee flexion 4+ 4-  Knee extension 4+ 4  Ankle dorsiflexion 4+ 4+   Ankle plantarflexion    Ankle inversion    Ankle eversion     (Blank rows = not tested)  LUMBAR SPECIAL TESTS:  Straight leg raise test: Positive and Slump test: Positive  FUNCTIONAL TESTS:  5 times sit to stand:  25.44 heavy use of BUE on arm rests  Timed up and go (TUG): 17.14 6 minute walk test: 465ft 1 standing break. Unable to complete 6 min; Pt required to sit at 3:44. 10 meter walk test: 13.76 sec (0.75m/s)   GAIT: Distance walked: 75 Assistive device utilized: Single point cane Level of assistance: Modified independence Comments: antalgic, hip hike on the LLE, increased stance time on the LLE with use of SPC.   TODAY'S TREATMENT:  DATE: 02/13/2023  Pt has inability to be in prone or supine for any length of time   Physical therapy treatment session today consisted of completing assessment of goals and administration of testing as demonstrated and documented in flow sheet, treatment, and goals section of this note. Addition treatments may be found below.   Therapeutic Exercise:  Right side-lying clam shell x 10, pillow support between legs, with L posterior-lateral gluteal TpR release with motion. Seated piriformis hold 30 sec x 2 each LE Seated hamstring stretch x 30 sec x 2 each LE  Manual Therapy:   Right Side-lying L lateral-posterior gluteal STM, followed with rolling stick in same location.  Note: Portions of this document were prepared using Dragon voice recognition software and although reviewed may contain unintentional dictation errors in syntax, grammar, or spelling.    PATIENT EDUCATION:  Education details: POC.  Pt educated  throughout session about proper posture and technique with exercises. Improved exercise technique, movement at target joints, use of target muscles after min to mod verbal, visual, tactile cues.  Person educated: Patient Education method: Explanation Education comprehension: verbalized understanding and needs further education  HOME EXERCISE PROGRAM: Piriformis stretch 3 x 30 sec  Sciatic nerve glides 5 sec hold x 10 bil  Seated truncal extension within pain free range 2 x 10   ASSESSMENT:  CLINICAL IMPRESSION:   Pt presents with good motivation today- Slightly tardy due to traffic issues. He continues to try hard in session despite his pain limitations- Continued to respond to self stretching with some relief- very touchy in piriformis and glute med region today with rolling stick. Discussed possibly trying some walking and exercises in pool and patient to think over and will discuss at next session. He did present with improved subjective self perceived ability as seen by improved FOTO yet decreased gait speed and 6 min walk time today. Patient's condition has the potential to improve in response to therapy. Maximum improvement is yet to be obtained. The anticipated improvement is attainable and reasonable in a generally predictable time.   Pt will continue to benefit from skilled physical therapy intervention to address impairments, improve QOL, and attain therapy goals.     OBJECTIVE IMPAIRMENTS: Abnormal gait, cardiopulmonary status limiting activity, decreased activity tolerance, decreased balance, decreased endurance, decreased mobility, difficulty walking, decreased ROM, decreased strength, hypomobility, increased fascial restrictions, impaired perceived functional ability, impaired flexibility, impaired sensation, improper body mechanics, postural dysfunction, obesity, and pain.   ACTIVITY LIMITATIONS: carrying, lifting, bending, sitting, standing, and sleeping  PARTICIPATION  LIMITATIONS: driving, shopping, community activity, and yard work  PERSONAL FACTORS: Age, Fitness, Past/current experiences, Social background, and 1 comorbidity: Arthritis   are also affecting patient's functional outcome.   REHAB POTENTIAL: Fair history or chronic LBP -  CLINICAL DECISION MAKING: Evolving/moderate complexity  EVALUATION COMPLEXITY: Moderate   GOALS:  SHORT TERM GOALS: Target date: 02/21/2023    Patient will be independent in home exercise program to improve strength/mobility for better functional independence with ADLs. Baseline: 02/13/2023- Patient reports able to perform his current HEP without significant issues.  Goal status: MET   LONG TERM GOALS: Target date: 04/04/2023    Patient will increase FOTO score to equal to or greater than   45  to demonstrate statistically significant improvement in mobility and quality of life.  Baseline: 32; 02/13/2023= 35 Goal status: Progressing  2.  Patient will reduce baseline pain level by 2 points to improve QoL and improve   Baseline: 8/10; 02/13/2023- 8/10 with  pain meds Goal status: Unchanged  3.  Patient will increase walk test to >1084ft to demonstrate decreased fall risk during functional activities and improved access to community Baseline: to be completed at later date. 01/17/2023= 440 feet in 3:44; 02/13/2023= 330 feet in 2 min 40 sec.   Goal status: ONGOING  4.  Patient will increase 10 meter walk test to >1.101m/s as to improve gait speed for better community ambulation and to reduce fall risk. Baseline: 0.76m/s; 02/13/2023= 0.63 m/s  Goal status: ONGOING  5.  Patient will reduce timed up and go to <11 seconds to reduce fall risk and demonstrate improved transfer/gait ability. Baseline: 17.14sec Goal status: INITIAL  6.  Patient will increase LLE to 4+/5 grossly to improve safety with community mobility and reduce risk of falls and independence with stair management.  Baseline: 4-/5 to 4/5; 02/13/2023= 4-/5 left  hip flex/abd/knee ext/flex Goal status: Ongoing   PLAN:  PT FREQUENCY: 1-2x/week  PT DURATION: 12 weeks  PLANNED INTERVENTIONS: Therapeutic exercises, Therapeutic activity, Neuromuscular re-education, Balance training, Gait training, Patient/Family education, Self Care, Joint mobilization, Joint manipulation, Stair training, DME instructions, Spinal manipulation, Spinal mobilization, Cryotherapy, Moist heat, scar mobilization, and Manual therapy.  PLAN FOR NEXT SESSION:   Core stabilization. Education on posture. Adjust HEP as needed.   Lenda Kelp PT  Physical Therapist- [facility]  1:14 PM 02/13/23

## 2023-02-13 ENCOUNTER — Ambulatory Visit: Payer: BC Managed Care – PPO | Attending: Family Medicine

## 2023-02-13 DIAGNOSIS — M5459 Other low back pain: Secondary | ICD-10-CM | POA: Diagnosis not present

## 2023-02-13 DIAGNOSIS — M5416 Radiculopathy, lumbar region: Secondary | ICD-10-CM | POA: Insufficient documentation

## 2023-02-13 DIAGNOSIS — M6281 Muscle weakness (generalized): Secondary | ICD-10-CM | POA: Insufficient documentation

## 2023-02-13 DIAGNOSIS — R262 Difficulty in walking, not elsewhere classified: Secondary | ICD-10-CM | POA: Diagnosis not present

## 2023-02-15 ENCOUNTER — Ambulatory Visit: Payer: BC Managed Care – PPO | Admitting: Physical Therapy

## 2023-02-15 DIAGNOSIS — R262 Difficulty in walking, not elsewhere classified: Secondary | ICD-10-CM

## 2023-02-15 DIAGNOSIS — M6281 Muscle weakness (generalized): Secondary | ICD-10-CM | POA: Diagnosis not present

## 2023-02-15 DIAGNOSIS — M5459 Other low back pain: Secondary | ICD-10-CM

## 2023-02-15 DIAGNOSIS — M5416 Radiculopathy, lumbar region: Secondary | ICD-10-CM

## 2023-02-15 NOTE — Therapy (Signed)
OUTPATIENT PHYSICAL THERAPY THORACOLUMBAR TREATMENT   Patient Name: Fred Tran MRN: [id number] DOB:Sep 29, 1965, 57 y.o., male Today's Date: 02/15/2023  END OF SESSION:  PT End of Session - 02/15/23 0851     Visit Number 11    Number of Visits 24    Date for PT Re-Evaluation 04/04/23    Progress Note Due on Visit 10    PT Start Time 0850    PT Stop Time 0930    PT Time Calculation (min) 40 min    Activity Tolerance Patient tolerated treatment well;Patient limited by pain    Behavior During Therapy [facility] for tasks assessed/performed                  Past Medical History:  Diagnosis Date   Arthritis    Hyperlipidemia    Obesity (BMI 35.0-39.9 without comorbidity)    Tobacco abuse    Past Surgical History:  Procedure Laterality Date   ARTHRODESIS,ANTERIOR INTERBODY TECHNIQUE,INCLUDE MINIMAL DISKECTOMY TO PREPARE INTERSPACE; LUMBAR  01/29/2022   FINGER SURGERY Left    ring finger   KNEE SURGERY Right    Patient Active Problem List   Diagnosis Date Noted   Lumbar radiculopathy 12/08/2021   Other intervertebral disc degeneration, lumbar region 10/26/2020   Adjustment disorder with mixed anxiety and depressed mood 04/03/2019   Obesity (BMI 35.0-39.9 without comorbidity)    Tobacco abuse    Hyperlipidemia     PCP: Dorcas Carrow, DO   REFERRING PROVIDER: Dorcas Carrow, DO   REFERRING DIAG:  Diagnosis  M54.16 (ICD-10-CM) - Lumbar radiculopathy    Rationale for Evaluation and Treatment: Rehabilitation  THERAPY DIAG:  Other low back pain  Radiculopathy, lumbar region  Muscle weakness (generalized)  Difficulty in walking, not elsewhere classified  ONSET DATE: several years with multiple surgeries   SUBJECTIVE:                                                                                                                                                                                           SUBJECTIVE STATEMENT:  Patient reports feeling  about the same but states his right low back/buttock has been bothering him some. Cramping in BLE at hips noted and hypersensitively in the LLE.  Reports pain still around 8/10 today.  Reports that his next appointment with pain specialist will be beginning of August.   PERTINENT HISTORY:  Fred Tran is a 57 y.o. male with a PMHx significant for arthritis and s/p L3-4 XLIF/revision PI on 01/29/22.  The lumbar spine examination shows posterior fusion from the level of C6 L4 to through L4. Disc spacers  are noted at the level of L2-L3 and L3-L4  PAIN:  Are you having pain? Yes: NPRS scale: 8/10 Pain location: L hip and lower R back   Pain description: burning/stabbing   Aggravating factors: constant Relieving factors: sitting still provides some short tern relief.   PRECAUTIONS: None and Other: history of back precautions following each surgery   WEIGHT BEARING RESTRICTIONS: No  FALLS:  Has patient fallen in last 6 months? Yes. Number of falls at least 6. Reports that L knee "gives out"  LIVING ENVIRONMENT: Lives with: lives with their spouse Lives in: Mobile home Stairs: Yes: External: 5 steps; on right going up, on left going up, and can reach both Has following equipment at home: Single point cane and has RW but does not use   OCCUPATION: disability   PLOF: Independent with household mobility without device and Requires assistive device for independence use of SPC in community   PATIENT GOALS: pain relief   NEXT MD VISIT: 02/11/2023 with Dr Laural Benes. 6/24: pain management follow up visit.   OBJECTIVE:   DIAGNOSTIC FINDINGS:  Imaging/Tests: MRI Cervical Thoracic Lumbar Spine 07/23/22 FINDINGS: The C2-C3 disc level is within normal limits.   The C3-C4 disc level shows hypertrophy of the left uncovertebral joint leading to moderate stenosis of at the corresponding neural foramen.   The C4-C5 level shows minimal posterior disc osteophyte formation but is otherwise  within normal limits.   The C5-C6 level is degraded by motion from shoulders but shows posterior disc osteophyte formation with some compression of the ventral subarachnoid space and moderate narrowing of the neural foramina bilaterally and symmetrically.   The C6 vertebral body shows high T2 signal intensity. C7 vertebral body shows similar findings in the disc space between these 2 vertebral bodies is somewhat bright. Presumably this is degenerative in nature. Axial images are degraded by artifact from shoulders and cannot be adequately evaluated but there appears to be posterior disc osteophyte formation resulting in a borderline spinal canal diameter.   The C7-T1 disc level appears to be within normal limits.   The structures at the level of the foramen magnum are normal. The signal intensity from the spinal cord is unremarkable.   Thoracic spine shows kyphosis as well as minimal anterior wedging of the T7 and possibly T8 vertebral bodies. There are posterior disc bulges indenting the ventral subarachnoid space extending from the level of T5-T6 to T8-T9. There is no significant spinal canal stenosis. The spinal cord appears to be somewhat diminished in its caliber but is otherwise within normal limits.   The lumbar spine examination shows posterior fusion from the level of C6 L4 to through L4. Disc spacers are noted at the level of L2-L3 and L3-L4. There is now an additional caudal level of fusion when compared to the previous examination.   Metal artifact obstructs most of the view through the region of the fusion. Please note that axial images were not obtained through the lumbar spine as the patient requested to be evacuated from the scanner. There is a posterior disc herniation at the level of L2-L3 which was present previously and is unchanged. There is now what appears to be a diffuse disc bulge with a protrusion especially to the left at the L4-L5 level which has increased in severity when  compared to the prior study.  IMPRESSION Degenerative changes in the cervical spine with no evidence of significant spinal canal stenosis.   No evidence of spinal canal stenosis in the thoracic region.  Interval additional level of fusion at the level of L3-L4 when compared to the prior study. Interval progression of degenerative changes at the level of L4-L5. Incomplete study.   XR Lumbar Spine 07/10/22 FINDINGS:  Redemonstration of posterior fusion from L2-L4 with intervertebral disc spacers. No perihardware lucency or fracture. Unchanged alignment. Intervertebral disc space narrowing at L4-L5 and L5-S1 levels. Facetal arthropathy at L1-L2 and L5-S1 levels.  IMPRESSION Posterior fusion L2-L4 with intervertebral disc spacers. No interval/new hardware complications.  Moderate degenerative disease of the lumbar spine at the nonfused levels.    PATIENT SURVEYS:  Modified Oswestry 28/50 56%   FOTO 32  SCREENING FOR RED FLAGS: Bowel or bladder incontinence: No Spinal tumors: No Cauda equina syndrome: No Compression fracture: No Abdominal aneurysm: No  COGNITION: Overall cognitive status: Within functional limits for tasks assessed     SENSATION: Light touch: Impaired   MUSCLE LENGTH: Hamstrings: Right  deg; Left  deg Maisie Fus test: Right  deg; Left  deg  POSTURE: rounded shoulders and posterior pelvic tilt  PALPATION: Bil lumbar scars. Tigger point in Bil paraspinals from T8-SI  joint.   LUMBAR ROM:   AROM eval  Flexion Limited due to Fusion  Extension 5 deg  Right lateral flexion 12  Left lateral flexion 10  Right rotation   Left rotation    (Blank rows = not tested)  LOWER EXTREMITY ROM:     Active  Right eval Left eval  Hip flexion    Hip extension    Hip abduction    Hip adduction    Hip internal rotation    Hip external rotation    Knee flexion    Knee extension    Ankle dorsiflexion    Ankle plantarflexion    Ankle inversion    Ankle eversion      (Blank rows = not tested)  LOWER EXTREMITY MMT:    MMT Right eval Left eval  Hip flexion 4 4  Hip extension    Hip abduction 4+ 4  Hip adduction 4+ 4  Hip internal rotation    Hip external rotation    Knee flexion 4+ 4-  Knee extension 4+ 4  Ankle dorsiflexion 4+ 4+   Ankle plantarflexion    Ankle inversion    Ankle eversion     (Blank rows = not tested)  LUMBAR SPECIAL TESTS:  Straight leg raise test: Positive and Slump test: Positive  FUNCTIONAL TESTS:  5 times sit to stand:  25.44 heavy use of BUE on arm rests  Timed up and go (TUG): 17.14 6 minute walk test: 451ft 1 standing break. Unable to complete 6 min; Pt required to sit at 3:44. 10 meter walk test: 13.76 sec (0.13m/s)   GAIT: Distance walked: 75 Assistive device utilized: Single point cane Level of assistance: Modified independence Comments: antalgic, hip hike on the LLE, increased stance time on the LLE with use of SPC.   TODAY'S TREATMENT:  DATE: 02/15/2023  Pt has inability to be in prone or supine for any length of time; but able to tolerate sidelying on both sides on this day.     Therapeutic Exercise:  side-lying clam shell x 10 bilateral Seated piriformis hold 30 sec x 2 each LE Seated hamstring stretch x 30 sec x 2 each LE Seated therapy ball roll forward x 15 Seated therapeutic ball wall diagonal  x 12 bilateral Standing lunges stretch on second step of stairs 2 x 25 seconds bilateral Seated hip abduction with green Thera-Band.  X 12 Seated low row with partial trunk extension x 12  Manual Therapy:   Right Side-lying L lateral-posterior gluteal STM, 2 x 1 minute bilateral Hip extension in side-lying 2 x 45 seconds bilateral STM to bilateral flank and QL region 2 x 30 seconds bilateral   Note: Portions of this document were prepared using Dragon voice recognition  software and although reviewed may contain unintentional dictation errors in syntax, grammar, or spelling.    PATIENT EDUCATION:  Education details: POC.  Pt educated throughout session about proper posture and technique with exercises. Improved exercise technique, movement at target joints, use of target muscles after min to mod verbal, visual, tactile cues.  Person educated: Patient Education method: Explanation Education comprehension: verbalized understanding and needs further education  HOME EXERCISE PROGRAM: Piriformis stretch 3 x 30 sec  Sciatic nerve glides 5 sec hold x 10 bil  Seated truncal extension within pain free range 2 x 10   ASSESSMENT:  CLINICAL IMPRESSION:   Pt presents with good motivation today.  Patient continues to report 8 out of 10 pain at rest.  Able to demonstrate mild improvement in range of motion for therapy ball roll hamstring and piriformis stretch as well as standing lunge stretch on the stairs.  Patient able to tolerate right and left side-lying for therapeutic exercise and manual therapy on this day.  Reports decreased stiffness in the lower back and bilateral lower extremities at end of PT treatment.  Pt will continue to benefit from skilled physical therapy intervention to address impairments, improve QOL, and attain therapy goals.     OBJECTIVE IMPAIRMENTS: Abnormal gait, cardiopulmonary status limiting activity, decreased activity tolerance, decreased balance, decreased endurance, decreased mobility, difficulty walking, decreased ROM, decreased strength, hypomobility, increased fascial restrictions, impaired perceived functional ability, impaired flexibility, impaired sensation, improper body mechanics, postural dysfunction, obesity, and pain.   ACTIVITY LIMITATIONS: carrying, lifting, bending, sitting, standing, and sleeping  PARTICIPATION LIMITATIONS: driving, shopping, community activity, and yard work  PERSONAL FACTORS: Age, Fitness,  Past/current experiences, Social background, and 1 comorbidity: Arthritis   are also affecting patient's functional outcome.   REHAB POTENTIAL: Fair history or chronic LBP -  CLINICAL DECISION MAKING: Evolving/moderate complexity  EVALUATION COMPLEXITY: Moderate   GOALS:  SHORT TERM GOALS: Target date: 02/21/2023    Patient will be independent in home exercise program to improve strength/mobility for better functional independence with ADLs. Baseline: 02/13/2023- Patient reports able to perform his current HEP without significant issues.  Goal status: MET   LONG TERM GOALS: Target date: 04/04/2023    Patient will increase FOTO score to equal to or greater than   45  to demonstrate statistically significant improvement in mobility and quality of life.  Baseline: 32; 02/13/2023= 35 Goal status: Progressing  2.  Patient will reduce baseline pain level by 2 points to improve QoL and improve   Baseline: 8/10; 02/13/2023- 8/10 with pain meds Goal status: Unchanged  3.  Patient will increase walk test to >1045ft to demonstrate decreased fall risk during functional activities and improved access to community Baseline: to be completed at later date. 01/17/2023= 440 feet in 3:44; 02/13/2023= 330 feet in 2 min 40 sec.   Goal status: ONGOING  4.  Patient will increase 10 meter walk test to >1.58m/s as to improve gait speed for better community ambulation and to reduce fall risk. Baseline: 0.69m/s; 02/13/2023= 0.63 m/s  Goal status: ONGOING  5.  Patient will reduce timed up and go to <11 seconds to reduce fall risk and demonstrate improved transfer/gait ability. Baseline: 17.14sec Goal status: INITIAL  6.  Patient will increase LLE to 4+/5 grossly to improve safety with community mobility and reduce risk of falls and independence with stair management.  Baseline: 4-/5 to 4/5; 02/13/2023= 4-/5 left hip flex/abd/knee ext/flex Goal status: Ongoing   PLAN:  PT FREQUENCY: 1-2x/week  PT  DURATION: 12 weeks  PLANNED INTERVENTIONS: Therapeutic exercises, Therapeutic activity, Neuromuscular re-education, Balance training, Gait training, Patient/Family education, Self Care, Joint mobilization, Joint manipulation, Stair training, DME instructions, Spinal manipulation, Spinal mobilization, Cryotherapy, Moist heat, scar mobilization, and Manual therapy.  PLAN FOR NEXT SESSION:   Core stabilization.  Education on posture.  Adjust HEP as needed.   Golden Pop PT  Physical Therapist- [place]  [facility]  12:30 PM 02/15/23

## 2023-02-20 ENCOUNTER — Ambulatory Visit: Payer: BC Managed Care – PPO | Admitting: Physical Therapy

## 2023-02-21 NOTE — Therapy (Signed)
OUTPATIENT PHYSICAL THERAPY THORACOLUMBAR TREATMENT   Patient Name: Fred Tran MRN: [id number] DOB:02/10/1966, 57 y.o., male Today's Date: 02/22/2023  END OF SESSION:  PT End of Session - 02/22/23 0815     Visit Number 12    Number of Visits 24    Date for PT Re-Evaluation 04/04/23    Progress Note Due on Visit 20    PT Start Time 0804    PT Stop Time 0844    PT Time Calculation (min) 40 min    Activity Tolerance Patient tolerated treatment well;Patient limited by pain    Behavior During Therapy [facility] for tasks assessed/performed                   Past Medical History:  Diagnosis Date   Arthritis    Hyperlipidemia    Obesity (BMI 35.0-39.9 without comorbidity)    Tobacco abuse    Past Surgical History:  Procedure Laterality Date   ARTHRODESIS,ANTERIOR INTERBODY TECHNIQUE,INCLUDE MINIMAL DISKECTOMY TO PREPARE INTERSPACE; LUMBAR  01/29/2022   FINGER SURGERY Left    ring finger   KNEE SURGERY Right    Patient Active Problem List   Diagnosis Date Noted   Lumbar radiculopathy 12/08/2021   Other intervertebral disc degeneration, lumbar region 10/26/2020   Adjustment disorder with mixed anxiety and depressed mood 04/03/2019   Obesity (BMI 35.0-39.9 without comorbidity)    Tobacco abuse    Hyperlipidemia     PCP: Dorcas Carrow, DO   REFERRING PROVIDER: Dorcas Carrow, DO   REFERRING DIAG:  Diagnosis  M54.16 (ICD-10-CM) - Lumbar radiculopathy    Rationale for Evaluation and Treatment: Rehabilitation  THERAPY DIAG:  Other low back pain  Radiculopathy, lumbar region  Muscle weakness (generalized)  Difficulty in walking, not elsewhere classified  ONSET DATE: several years with multiple surgeries   SUBJECTIVE:                                                                                                                                                                                           SUBJECTIVE STATEMENT:  Patient reports  feeling increased pain this date. Reports he is also recovering illness since he was last seen.   Reports that his next appointment with pain specialist will be beginning of August.   PERTINENT HISTORY:  Fred Tran is a 56 y.o. male with a PMHx significant for arthritis and s/p L3-4 XLIF/revision PI on 01/29/22.  The lumbar spine examination shows posterior fusion from the level of C6 L4 to through L4. Disc spacers are noted at the level of L2-L3 and L3-L4  PAIN:  Are you having  pain? Yes: NPRS scale: 8/10 Pain location: L hip and lower R back   Pain description: burning/stabbing   Aggravating factors: constant Relieving factors: sitting still provides some short tern relief.   PRECAUTIONS: None and Other: history of back precautions following each surgery   WEIGHT BEARING RESTRICTIONS: No  FALLS:  Has patient fallen in last 6 months? Yes. Number of falls at least 6. Reports that L knee "gives out"  LIVING ENVIRONMENT: Lives with: lives with their spouse Lives in: Mobile home Stairs: Yes: External: 5 steps; on right going up, on left going up, and can reach both Has following equipment at home: Single point cane and has RW but does not use   OCCUPATION: disability   PLOF: Independent with household mobility without device and Requires assistive device for independence use of SPC in community   PATIENT GOALS: pain relief   NEXT MD VISIT: 02/11/2023 with Dr Laural Benes. 6/24: pain management follow up visit.   OBJECTIVE:   DIAGNOSTIC FINDINGS:  Imaging/Tests: MRI Cervical Thoracic Lumbar Spine 07/23/22 FINDINGS: The C2-C3 disc level is within normal limits.   The C3-C4 disc level shows hypertrophy of the left uncovertebral joint leading to moderate stenosis of at the corresponding neural foramen.   The C4-C5 level shows minimal posterior disc osteophyte formation but is otherwise within normal limits.   The C5-C6 level is degraded by motion from shoulders but shows  posterior disc osteophyte formation with some compression of the ventral subarachnoid space and moderate narrowing of the neural foramina bilaterally and symmetrically.   The C6 vertebral body shows high T2 signal intensity. C7 vertebral body shows similar findings in the disc space between these 2 vertebral bodies is somewhat bright. Presumably this is degenerative in nature. Axial images are degraded by artifact from shoulders and cannot be adequately evaluated but there appears to be posterior disc osteophyte formation resulting in a borderline spinal canal diameter.   The C7-T1 disc level appears to be within normal limits.   The structures at the level of the foramen magnum are normal. The signal intensity from the spinal cord is unremarkable.   Thoracic spine shows kyphosis as well as minimal anterior wedging of the T7 and possibly T8 vertebral bodies. There are posterior disc bulges indenting the ventral subarachnoid space extending from the level of T5-T6 to T8-T9. There is no significant spinal canal stenosis. The spinal cord appears to be somewhat diminished in its caliber but is otherwise within normal limits.   The lumbar spine examination shows posterior fusion from the level of C6 L4 to through L4. Disc spacers are noted at the level of L2-L3 and L3-L4. There is now an additional caudal level of fusion when compared to the previous examination.   Metal artifact obstructs most of the view through the region of the fusion. Please note that axial images were not obtained through the lumbar spine as the patient requested to be evacuated from the scanner. There is a posterior disc herniation at the level of L2-L3 which was present previously and is unchanged. There is now what appears to be a diffuse disc bulge with a protrusion especially to the left at the L4-L5 level which has increased in severity when compared to the prior study.  IMPRESSION Degenerative changes in the cervical spine with  no evidence of significant spinal canal stenosis.   No evidence of spinal canal stenosis in the thoracic region.   Interval additional level of fusion at the level of L3-L4 when compared to  the prior study. Interval progression of degenerative changes at the level of L4-L5. Incomplete study.   XR Lumbar Spine 07/10/22 FINDINGS:  Redemonstration of posterior fusion from L2-L4 with intervertebral disc spacers. No perihardware lucency or fracture. Unchanged alignment. Intervertebral disc space narrowing at L4-L5 and L5-S1 levels. Facetal arthropathy at L1-L2 and L5-S1 levels.  IMPRESSION Posterior fusion L2-L4 with intervertebral disc spacers. No interval/new hardware complications.  Moderate degenerative disease of the lumbar spine at the nonfused levels.    PATIENT SURVEYS:  Modified Oswestry 28/50 56%   FOTO 32  SCREENING FOR RED FLAGS: Bowel or bladder incontinence: No Spinal tumors: No Cauda equina syndrome: No Compression fracture: No Abdominal aneurysm: No  COGNITION: Overall cognitive status: Within functional limits for tasks assessed     SENSATION: Light touch: Impaired   MUSCLE LENGTH: Hamstrings: Right  deg; Left  deg Maisie Fus test: Right  deg; Left  deg  POSTURE: rounded shoulders and posterior pelvic tilt  PALPATION: Bil lumbar scars. Tigger point in Bil paraspinals from T8-SI  joint.   LUMBAR ROM:   AROM eval  Flexion Limited due to Fusion  Extension 5 deg  Right lateral flexion 12  Left lateral flexion 10  Right rotation   Left rotation    (Blank rows = not tested)  LOWER EXTREMITY ROM:     Active  Right eval Left eval  Hip flexion    Hip extension    Hip abduction    Hip adduction    Hip internal rotation    Hip external rotation    Knee flexion    Knee extension    Ankle dorsiflexion    Ankle plantarflexion    Ankle inversion    Ankle eversion     (Blank rows = not tested)  LOWER EXTREMITY MMT:    MMT Right eval Left eval  Hip  flexion 4 4  Hip extension    Hip abduction 4+ 4  Hip adduction 4+ 4  Hip internal rotation    Hip external rotation    Knee flexion 4+ 4-  Knee extension 4+ 4  Ankle dorsiflexion 4+ 4+   Ankle plantarflexion    Ankle inversion    Ankle eversion     (Blank rows = not tested)  LUMBAR SPECIAL TESTS:  Straight leg raise test: Positive and Slump test: Positive  FUNCTIONAL TESTS:  5 times sit to stand:  25.44 heavy use of BUE on arm rests  Timed up and go (TUG): 17.14 6 minute walk test: 492ft 1 standing break. Unable to complete 6 min; Pt required to sit at 3:44. 10 meter walk test: 13.76 sec (0.58m/s)   GAIT: Distance walked: 75 Assistive device utilized: Single point cane Level of assistance: Modified independence Comments: antalgic, hip hike on the LLE, increased stance time on the LLE with use of SPC.   TODAY'S TREATMENT:  DATE: 02/22/2023  Pt has inability to be in prone or supine for any length of time; but able to tolerate sidelying on both sides on this day.    Therapeutic Exercise: Standing lunges stretch on second step of stairs x 45 seconds bilateral Standing knee to chest simulated stretch at steps x 45 sec ea LE  side-lying clam shell 2 x 10 bilateral Seated ball squeeze 20 x 5 sec holds  Seated piriformis hold 30 sec x 2 each LE Seated hamstring stretch x 30 sec x 2 each LE Seated therapy ball roll forward x 15 Seated therapeutic ball roll diagonal  x 12 bilateral   Manual Therapy:   Right Side-lying L lateral-posterior gluteal STM, 4-5 min on R and 1 min on L due to pain with R SL STM to bilateral flank and QL region 2 x 30 seconds bilateral   Note: Portions of this document were prepared using Dragon voice recognition software and although reviewed may contain unintentional dictation errors in syntax, grammar, or  spelling.    PATIENT EDUCATION:  Education details: POC.  Pt educated throughout session about proper posture and technique with exercises. Improved exercise technique, movement at target joints, use of target muscles after min to mod verbal, visual, tactile cues.  Person educated: Patient Education method: Explanation Education comprehension: verbalized understanding and needs further education  HOME EXERCISE PROGRAM: Piriformis stretch 3 x 30 sec  Sciatic nerve glides 5 sec hold x 10 bil  Seated truncal extension within pain free range 2 x 10   ASSESSMENT:  CLINICAL IMPRESSION:   Pt presents with good motivation today.  Patient continues to report 8 out of 10 pain at rest.   Patient able to tolerate right and left side-lying for therapeutic exercise and manual therapy on this day but R SL was more painful and this positioning was limited as a result. Pt progressed with reps with gravity resisted hip strengthening with good tolerance. Pt having increased pain this date so exercises were not progressed with standing core muscle activation. Pt will continue to benefit from skilled physical therapy intervention to address impairments, improve QOL, and attain therapy goals.     OBJECTIVE IMPAIRMENTS: Abnormal gait, cardiopulmonary status limiting activity, decreased activity tolerance, decreased balance, decreased endurance, decreased mobility, difficulty walking, decreased ROM, decreased strength, hypomobility, increased fascial restrictions, impaired perceived functional ability, impaired flexibility, impaired sensation, improper body mechanics, postural dysfunction, obesity, and pain.   ACTIVITY LIMITATIONS: carrying, lifting, bending, sitting, standing, and sleeping  PARTICIPATION LIMITATIONS: driving, shopping, community activity, and yard work  PERSONAL FACTORS: Age, Fitness, Past/current experiences, Social background, and 1 comorbidity: Arthritis   are also affecting patient's  functional outcome.   REHAB POTENTIAL: Fair history or chronic LBP -  CLINICAL DECISION MAKING: Evolving/moderate complexity  EVALUATION COMPLEXITY: Moderate   GOALS:  SHORT TERM GOALS: Target date: 02/21/2023    Patient will be independent in home exercise program to improve strength/mobility for better functional independence with ADLs. Baseline: 02/13/2023- Patient reports able to perform his current HEP without significant issues.  Goal status: MET   LONG TERM GOALS: Target date: 04/04/2023    Patient will increase FOTO score to equal to or greater than   45  to demonstrate statistically significant improvement in mobility and quality of life.  Baseline: 32; 02/13/2023= 35 Goal status: Progressing  2.  Patient will reduce baseline pain level by 2 points to improve QoL and improve   Baseline: 8/10; 02/13/2023- 8/10 with pain meds Goal status: Unchanged  3.  Patient will increase walk test to >1028ft to demonstrate decreased fall risk during functional activities and improved access to community Baseline: to be completed at later date. 01/17/2023= 440 feet in 3:44; 02/13/2023= 330 feet in 2 min 40 sec.   Goal status: ONGOING  4.  Patient will increase 10 meter walk test to >1.55m/s as to improve gait speed for better community ambulation and to reduce fall risk. Baseline: 0.48m/s; 02/13/2023= 0.63 m/s  Goal status: ONGOING  5.  Patient will reduce timed up and go to <11 seconds to reduce fall risk and demonstrate improved transfer/gait ability. Baseline: 17.14sec Goal status: INITIAL  6.  Patient will increase LLE to 4+/5 grossly to improve safety with community mobility and reduce risk of falls and independence with stair management.  Baseline: 4-/5 to 4/5; 02/13/2023= 4-/5 left hip flex/abd/knee ext/flex Goal status: Ongoing   PLAN:  PT FREQUENCY: 1-2x/week  PT DURATION: 12 weeks  PLANNED INTERVENTIONS: Therapeutic exercises, Therapeutic activity, Neuromuscular  re-education, Balance training, Gait training, Patient/Family education, Self Care, Joint mobilization, Joint manipulation, Stair training, DME instructions, Spinal manipulation, Spinal mobilization, Cryotherapy, Moist heat, scar mobilization, and Manual therapy.  PLAN FOR NEXT SESSION:  Core stabilization.  Education on posture.  Adjust HEP as needed.   Norman Herrlich PT  Physical Therapist- [facility]  [facility]  9:49 AM 02/22/23

## 2023-02-22 ENCOUNTER — Encounter: Payer: Self-pay | Admitting: Physical Therapy

## 2023-02-22 ENCOUNTER — Ambulatory Visit: Payer: BC Managed Care – PPO | Admitting: Physical Therapy

## 2023-02-22 DIAGNOSIS — M5459 Other low back pain: Secondary | ICD-10-CM | POA: Diagnosis not present

## 2023-02-22 DIAGNOSIS — M6281 Muscle weakness (generalized): Secondary | ICD-10-CM | POA: Diagnosis not present

## 2023-02-22 DIAGNOSIS — M5416 Radiculopathy, lumbar region: Secondary | ICD-10-CM

## 2023-02-22 DIAGNOSIS — R262 Difficulty in walking, not elsewhere classified: Secondary | ICD-10-CM | POA: Diagnosis not present

## 2023-02-27 ENCOUNTER — Encounter: Payer: Self-pay | Admitting: Physical Therapy

## 2023-02-27 ENCOUNTER — Ambulatory Visit: Payer: BC Managed Care – PPO | Admitting: Physical Therapy

## 2023-02-27 DIAGNOSIS — M5459 Other low back pain: Secondary | ICD-10-CM

## 2023-02-27 DIAGNOSIS — M6281 Muscle weakness (generalized): Secondary | ICD-10-CM

## 2023-02-27 DIAGNOSIS — M5416 Radiculopathy, lumbar region: Secondary | ICD-10-CM

## 2023-02-27 DIAGNOSIS — R262 Difficulty in walking, not elsewhere classified: Secondary | ICD-10-CM | POA: Diagnosis not present

## 2023-02-27 NOTE — Therapy (Signed)
OUTPATIENT PHYSICAL THERAPY THORACOLUMBAR TREATMENT   Patient Name: Fred Tran MRN: [id number] DOB:December 14, 1965, 57 y.o., male Today's Date: 02/27/2023  END OF SESSION:  PT End of Session - 02/27/23 0804     Visit Number 13    Number of Visits 24    Date for PT Re-Evaluation 04/04/23    Progress Note Due on Visit 20    PT Start Time 0803    PT Stop Time 0844    PT Time Calculation (min) 41 min    Activity Tolerance Patient tolerated treatment well;Patient limited by pain    Behavior During Therapy [facility] for tasks assessed/performed                   Past Medical History:  Diagnosis Date   Arthritis    Hyperlipidemia    Obesity (BMI 35.0-39.9 without comorbidity)    Tobacco abuse    Past Surgical History:  Procedure Laterality Date   ARTHRODESIS,ANTERIOR INTERBODY TECHNIQUE,INCLUDE MINIMAL DISKECTOMY TO PREPARE INTERSPACE; LUMBAR  01/29/2022   FINGER SURGERY Left    ring finger   KNEE SURGERY Right    Patient Active Problem List   Diagnosis Date Noted   Lumbar radiculopathy 12/08/2021   Other intervertebral disc degeneration, lumbar region 10/26/2020   Adjustment disorder with mixed anxiety and depressed mood 04/03/2019   Obesity (BMI 35.0-39.9 without comorbidity)    Tobacco abuse    Hyperlipidemia     PCP: Dorcas Carrow, DO   REFERRING PROVIDER: Dorcas Carrow, DO   REFERRING DIAG:  Diagnosis  M54.16 (ICD-10-CM) - Lumbar radiculopathy    Rationale for Evaluation and Treatment: Rehabilitation  THERAPY DIAG:  Muscle weakness (generalized)  Other low back pain  Radiculopathy, lumbar region  ONSET DATE: several years with multiple surgeries   SUBJECTIVE:                                                                                                                                                                                           SUBJECTIVE STATEMENT:  Patient reports continued pain in his low back. He does report improved  pain following flexion based stretches so these were prioritized this date.    Reports that his next appointment with pain specialist will be beginning of August.   PERTINENT HISTORY:  Fred Tran is a 57 y.o. male with a PMHx significant for arthritis and s/p L3-4 XLIF/revision PI on 01/29/22.  The lumbar spine examination shows posterior fusion from the level of C6 L4 to through L4. Disc spacers are noted at the level of L2-L3 and L3-L4  PAIN:  Are you having pain?  Yes: NPRS scale: 8/10 Pain location: L hip and lower R back   Pain description: burning/stabbing   Aggravating factors: constant Relieving factors: sitting still provides some short tern relief.   PRECAUTIONS: None and Other: history of back precautions following each surgery   WEIGHT BEARING RESTRICTIONS: No  FALLS:  Has patient fallen in last 6 months? Yes. Number of falls at least 6. Reports that L knee "gives out"  LIVING ENVIRONMENT: Lives with: lives with their spouse Lives in: Mobile home Stairs: Yes: External: 5 steps; on right going up, on left going up, and can reach both Has following equipment at home: Single point cane and has RW but does not use   OCCUPATION: disability   PLOF: Independent with household mobility without device and Requires assistive device for independence use of SPC in community   PATIENT GOALS: pain relief   NEXT MD VISIT: 02/11/2023 with Dr Laural Benes. 6/24: pain management follow up visit.   OBJECTIVE:   DIAGNOSTIC FINDINGS:  Imaging/Tests: MRI Cervical Thoracic Lumbar Spine 07/23/22 FINDINGS: The C2-C3 disc level is within normal limits.   The C3-C4 disc level shows hypertrophy of the left uncovertebral joint leading to moderate stenosis of at the corresponding neural foramen.   The C4-C5 level shows minimal posterior disc osteophyte formation but is otherwise within normal limits.   The C5-C6 level is degraded by motion from shoulders but shows posterior disc  osteophyte formation with some compression of the ventral subarachnoid space and moderate narrowing of the neural foramina bilaterally and symmetrically.   The C6 vertebral body shows high T2 signal intensity. C7 vertebral body shows similar findings in the disc space between these 2 vertebral bodies is somewhat bright. Presumably this is degenerative in nature. Axial images are degraded by artifact from shoulders and cannot be adequately evaluated but there appears to be posterior disc osteophyte formation resulting in a borderline spinal canal diameter.   The C7-T1 disc level appears to be within normal limits.   The structures at the level of the foramen magnum are normal. The signal intensity from the spinal cord is unremarkable.   Thoracic spine shows kyphosis as well as minimal anterior wedging of the T7 and possibly T8 vertebral bodies. There are posterior disc bulges indenting the ventral subarachnoid space extending from the level of T5-T6 to T8-T9. There is no significant spinal canal stenosis. The spinal cord appears to be somewhat diminished in its caliber but is otherwise within normal limits.   The lumbar spine examination shows posterior fusion from the level of C6 L4 to through L4. Disc spacers are noted at the level of L2-L3 and L3-L4. There is now an additional caudal level of fusion when compared to the previous examination.   Metal artifact obstructs most of the view through the region of the fusion. Please note that axial images were not obtained through the lumbar spine as the patient requested to be evacuated from the scanner. There is a posterior disc herniation at the level of L2-L3 which was present previously and is unchanged. There is now what appears to be a diffuse disc bulge with a protrusion especially to the left at the L4-L5 level which has increased in severity when compared to the prior study.  IMPRESSION Degenerative changes in the cervical spine with no evidence of  significant spinal canal stenosis.   No evidence of spinal canal stenosis in the thoracic region.   Interval additional level of fusion at the level of L3-L4 when compared to the  prior study. Interval progression of degenerative changes at the level of L4-L5. Incomplete study.   XR Lumbar Spine 07/10/22 FINDINGS:  Redemonstration of posterior fusion from L2-L4 with intervertebral disc spacers. No perihardware lucency or fracture. Unchanged alignment. Intervertebral disc space narrowing at L4-L5 and L5-S1 levels. Facetal arthropathy at L1-L2 and L5-S1 levels.  IMPRESSION Posterior fusion L2-L4 with intervertebral disc spacers. No interval/new hardware complications.  Moderate degenerative disease of the lumbar spine at the nonfused levels.    PATIENT SURVEYS:  Modified Oswestry 28/50 56%   FOTO 32  SCREENING FOR RED FLAGS: Bowel or bladder incontinence: No Spinal tumors: No Cauda equina syndrome: No Compression fracture: No Abdominal aneurysm: No  COGNITION: Overall cognitive status: Within functional limits for tasks assessed     SENSATION: Light touch: Impaired   MUSCLE LENGTH: Hamstrings: Right  deg; Left  deg Maisie Fus test: Right  deg; Left  deg  POSTURE: rounded shoulders and posterior pelvic tilt  PALPATION: Bil lumbar scars. Tigger point in Bil paraspinals from T8-SI  joint.   LUMBAR ROM:   AROM eval  Flexion Limited due to Fusion  Extension 5 deg  Right lateral flexion 12  Left lateral flexion 10  Right rotation   Left rotation    (Blank rows = not tested)  LOWER EXTREMITY ROM:     Active  Right eval Left eval  Hip flexion    Hip extension    Hip abduction    Hip adduction    Hip internal rotation    Hip external rotation    Knee flexion    Knee extension    Ankle dorsiflexion    Ankle plantarflexion    Ankle inversion    Ankle eversion     (Blank rows = not tested)  LOWER EXTREMITY MMT:    MMT Right eval Left eval  Hip flexion 4 4  Hip  extension    Hip abduction 4+ 4  Hip adduction 4+ 4  Hip internal rotation    Hip external rotation    Knee flexion 4+ 4-  Knee extension 4+ 4  Ankle dorsiflexion 4+ 4+   Ankle plantarflexion    Ankle inversion    Ankle eversion     (Blank rows = not tested)  LUMBAR SPECIAL TESTS:  Straight leg raise test: Positive and Slump test: Positive  FUNCTIONAL TESTS:  5 times sit to stand:  25.44 heavy use of BUE on arm rests  Timed up and go (TUG): 17.14 6 minute walk test: 448ft 1 standing break. Unable to complete 6 min; Pt required to sit at 3:44. 10 meter walk test: 13.76 sec (0.40m/s)   GAIT: Distance walked: 75 Assistive device utilized: Single point cane Level of assistance: Modified independence Comments: antalgic, hip hike on the LLE, increased stance time on the LLE with use of SPC.   TODAY'S TREATMENT:  DATE: 02/27/2023  Pt has inability to be in prone or supine for any length of time  Therapeutic Exercise: Standing knee to chest simulated stretch at steps 3 x 45 sec ea LE  Seated piriformis hold 30 sec x 2 each LE Seated therapy ball roll forward 5 x 30 sec holds  Seated therapeutic ball roll diagonal  x 5 bilateral  Standing with TrA activation with step over and UE support 2 x 10 ea LE  Mini dead lift activity with 15# KB, pt tends to squat with no hip hinge despite instruction, cues for hip hinge and pt performs squat with improved form and some hip hinge activity. Ext cue for posture with PT holding cane along spine to prevent spinal flexion.   STM to R lumbar paraspinals x 3 min, many areas of tenderness noted.   Note: Portions of this document were prepared using Dragon voice recognition software and although reviewed may contain unintentional dictation errors in syntax, grammar, or spelling.    PATIENT EDUCATION:  Education details:  POC.  Pt educated throughout session about proper posture and technique with exercises. Improved exercise technique, movement at target joints, use of target muscles after min to mod verbal, visual, tactile cues.  Person educated: Patient Education method: Explanation Education comprehension: verbalized understanding and needs further education  HOME EXERCISE PROGRAM: Piriformis stretch 3 x 30 sec  Sciatic nerve glides 5 sec hold x 10 bil  Seated truncal extension within pain free range 2 x 10   ASSESSMENT:  CLINICAL IMPRESSION:  Pt presents with good motivation today.  Patient continues to report 8 out of 10 pain at rest.  Pt progressed with dynamic core muscle activation with good result and progressed with functional lifting mechanics and appropriate muscle activation. Pt reports more benefit from flexion based stretching so this will continue to be a target as pt positional preferences tolerate.  Pt will continue to benefit from skilled physical therapy intervention to address impairments, improve QOL, and attain therapy goals.     OBJECTIVE IMPAIRMENTS: Abnormal gait, cardiopulmonary status limiting activity, decreased activity tolerance, decreased balance, decreased endurance, decreased mobility, difficulty walking, decreased ROM, decreased strength, hypomobility, increased fascial restrictions, impaired perceived functional ability, impaired flexibility, impaired sensation, improper body mechanics, postural dysfunction, obesity, and pain.   ACTIVITY LIMITATIONS: carrying, lifting, bending, sitting, standing, and sleeping  PARTICIPATION LIMITATIONS: driving, shopping, community activity, and yard work  PERSONAL FACTORS: Age, Fitness, Past/current experiences, Social background, and 1 comorbidity: Arthritis   are also affecting patient's functional outcome.   REHAB POTENTIAL: Fair history or chronic LBP -  CLINICAL DECISION MAKING: Evolving/moderate complexity  EVALUATION  COMPLEXITY: Moderate   GOALS:  SHORT TERM GOALS: Target date: 02/21/2023    Patient will be independent in home exercise program to improve strength/mobility for better functional independence with ADLs. Baseline: 02/13/2023- Patient reports able to perform his current HEP without significant issues.  Goal status: MET   LONG TERM GOALS: Target date: 04/04/2023    Patient will increase FOTO score to equal to or greater than   45  to demonstrate statistically significant improvement in mobility and quality of life.  Baseline: 32; 02/13/2023= 35 Goal status: Progressing  2.  Patient will reduce baseline pain level by 2 points to improve QoL and improve   Baseline: 8/10; 02/13/2023- 8/10 with pain meds Goal status: Unchanged  3.  Patient will increase walk test to >1069ft to demonstrate decreased fall risk during functional activities and improved access to community Baseline:  to be completed at later date. 01/17/2023= 440 feet in 3:44; 02/13/2023= 330 feet in 2 min 40 sec.   Goal status: ONGOING  4.  Patient will increase 10 meter walk test to >1.56m/s as to improve gait speed for better community ambulation and to reduce fall risk. Baseline: 0.58m/s; 02/13/2023= 0.63 m/s  Goal status: ONGOING  5.  Patient will reduce timed up and go to <11 seconds to reduce fall risk and demonstrate improved transfer/gait ability. Baseline: 17.14sec Goal status: INITIAL  6.  Patient will increase LLE to 4+/5 grossly to improve safety with community mobility and reduce risk of falls and independence with stair management.  Baseline: 4-/5 to 4/5; 02/13/2023= 4-/5 left hip flex/abd/knee ext/flex Goal status: Ongoing   PLAN:  PT FREQUENCY: 1-2x/week  PT DURATION: 12 weeks  PLANNED INTERVENTIONS: Therapeutic exercises, Therapeutic activity, Neuromuscular re-education, Balance training, Gait training, Patient/Family education, Self Care, Joint mobilization, Joint manipulation, Stair training, DME  instructions, Spinal manipulation, Spinal mobilization, Cryotherapy, Moist heat, scar mobilization, and Manual therapy.  PLAN FOR NEXT SESSION:  Core stabilization.  Education on posture.  Adjust HEP as needed.   Norman Herrlich PT  Physical Therapist- [facility]  8:06 AM 02/27/23

## 2023-03-01 ENCOUNTER — Encounter: Payer: Self-pay | Admitting: Physical Therapy

## 2023-03-01 ENCOUNTER — Ambulatory Visit: Payer: BC Managed Care – PPO | Admitting: Physical Therapy

## 2023-03-01 DIAGNOSIS — M5416 Radiculopathy, lumbar region: Secondary | ICD-10-CM

## 2023-03-01 DIAGNOSIS — M6281 Muscle weakness (generalized): Secondary | ICD-10-CM | POA: Diagnosis not present

## 2023-03-01 DIAGNOSIS — M5459 Other low back pain: Secondary | ICD-10-CM

## 2023-03-01 DIAGNOSIS — R262 Difficulty in walking, not elsewhere classified: Secondary | ICD-10-CM | POA: Diagnosis not present

## 2023-03-01 NOTE — Therapy (Signed)
OUTPATIENT PHYSICAL THERAPY THORACOLUMBAR TREATMENT   Patient Name: Fred Tran MRN: [id number] DOB:[birth year], 57 y.o., male Today's Date: 03/01/2023  END OF SESSION:  PT End of Session - 03/01/23 0821     Visit Number 14    Number of Visits 24    Date for PT Re-Evaluation 04/04/23    Progress Note Due on Visit 20    PT Start Time 0803    PT Stop Time 0844    PT Time Calculation (min) 41 min    Activity Tolerance Patient tolerated treatment well;Patient limited by pain    Behavior During Therapy [facility] for tasks assessed/performed                   Past Medical History:  Diagnosis Date   Arthritis    Hyperlipidemia    Obesity (BMI 35.0-39.9 without comorbidity)    Tobacco abuse    Past Surgical History:  Procedure Laterality Date   ARTHRODESIS,ANTERIOR INTERBODY TECHNIQUE,INCLUDE MINIMAL DISKECTOMY TO PREPARE INTERSPACE; LUMBAR  01/29/2022   FINGER SURGERY Left    ring finger   KNEE SURGERY Right    Patient Active Problem List   Diagnosis Date Noted   Lumbar radiculopathy 12/08/2021   Other intervertebral disc degeneration, lumbar region 10/26/2020   Adjustment disorder with mixed anxiety and depressed mood 04/03/2019   Obesity (BMI 35.0-39.9 without comorbidity)    Tobacco abuse    Hyperlipidemia     PCP: Dorcas Carrow, DO   REFERRING PROVIDER: Dorcas Carrow, DO   REFERRING DIAG:  Diagnosis  M54.16 (ICD-10-CM) - Lumbar radiculopathy    Rationale for Evaluation and Treatment: Rehabilitation  THERAPY DIAG:  Other low back pain  Radiculopathy, lumbar region  Muscle weakness (generalized)  ONSET DATE: several years with multiple surgeries   SUBJECTIVE:                                                                                                                                                                                           SUBJECTIVE STATEMENT:  Patient reports continued pain in his low back. He reports continued  weakness in his Les and difficulty with stair navigation earlier this date.   Reports that his next appointment with pain specialist will be beginning of August.   PERTINENT HISTORY:  Fred Tran is a 57 y.o. male with a PMHx significant for arthritis and s/p L3-4 XLIF/revision PI on 01/29/22.  The lumbar spine examination shows posterior fusion from the level of C6 L4 to through L4. Disc spacers are noted at the level of L2-L3 and L3-L4  PAIN:  Are you having pain? Yes:  NPRS scale: 8/10 Pain location: L hip and lower R back   Pain description: burning/stabbing   Aggravating factors: constant Relieving factors: sitting still provides some short tern relief.   PRECAUTIONS: None and Other: history of back precautions following each surgery   WEIGHT BEARING RESTRICTIONS: No  FALLS:  Has patient fallen in last 6 months? Yes. Number of falls at least 6. Reports that L knee "gives out"  LIVING ENVIRONMENT: Lives with: lives with their spouse Lives in: Mobile home Stairs: Yes: External: 5 steps; on right going up, on left going up, and can reach both Has following equipment at home: Single point cane and has RW but does not use   OCCUPATION: disability   PLOF: Independent with household mobility without device and Requires assistive device for independence use of SPC in community   PATIENT GOALS: pain relief   NEXT MD VISIT: 02/11/2023 with Dr Laural Benes. 6/24: pain management follow up visit.   OBJECTIVE:   DIAGNOSTIC FINDINGS:  Imaging/Tests: MRI Cervical Thoracic Lumbar Spine 07/23/22 FINDINGS: The C2-C3 disc level is within normal limits.   The C3-C4 disc level shows hypertrophy of the left uncovertebral joint leading to moderate stenosis of at the corresponding neural foramen.   The C4-C5 level shows minimal posterior disc osteophyte formation but is otherwise within normal limits.   The C5-C6 level is degraded by motion from shoulders but shows posterior disc  osteophyte formation with some compression of the ventral subarachnoid space and moderate narrowing of the neural foramina bilaterally and symmetrically.   The C6 vertebral body shows high T2 signal intensity. C7 vertebral body shows similar findings in the disc space between these 2 vertebral bodies is somewhat bright. Presumably this is degenerative in nature. Axial images are degraded by artifact from shoulders and cannot be adequately evaluated but there appears to be posterior disc osteophyte formation resulting in a borderline spinal canal diameter.   The C7-T1 disc level appears to be within normal limits.   The structures at the level of the foramen magnum are normal. The signal intensity from the spinal cord is unremarkable.   Thoracic spine shows kyphosis as well as minimal anterior wedging of the T7 and possibly T8 vertebral bodies. There are posterior disc bulges indenting the ventral subarachnoid space extending from the level of T5-T6 to T8-T9. There is no significant spinal canal stenosis. The spinal cord appears to be somewhat diminished in its caliber but is otherwise within normal limits.   The lumbar spine examination shows posterior fusion from the level of C6 L4 to through L4. Disc spacers are noted at the level of L2-L3 and L3-L4. There is now an additional caudal level of fusion when compared to the previous examination.   Metal artifact obstructs most of the view through the region of the fusion. Please note that axial images were not obtained through the lumbar spine as the patient requested to be evacuated from the scanner. There is a posterior disc herniation at the level of L2-L3 which was present previously and is unchanged. There is now what appears to be a diffuse disc bulge with a protrusion especially to the left at the L4-L5 level which has increased in severity when compared to the prior study.  IMPRESSION Degenerative changes in the cervical spine with no evidence of  significant spinal canal stenosis.   No evidence of spinal canal stenosis in the thoracic region.   Interval additional level of fusion at the level of L3-L4 when compared to the prior  study. Interval progression of degenerative changes at the level of L4-L5. Incomplete study.   XR Lumbar Spine 07/10/22 FINDINGS:  Redemonstration of posterior fusion from L2-L4 with intervertebral disc spacers. No perihardware lucency or fracture. Unchanged alignment. Intervertebral disc space narrowing at L4-L5 and L5-S1 levels. Facetal arthropathy at L1-L2 and L5-S1 levels.  IMPRESSION Posterior fusion L2-L4 with intervertebral disc spacers. No interval/new hardware complications.  Moderate degenerative disease of the lumbar spine at the nonfused levels.    PATIENT SURVEYS:  Modified Oswestry 28/50 56%   FOTO 32  SCREENING FOR RED FLAGS: Bowel or bladder incontinence: No Spinal tumors: No Cauda equina syndrome: No Compression fracture: No Abdominal aneurysm: No  COGNITION: Overall cognitive status: Within functional limits for tasks assessed     SENSATION: Light touch: Impaired   MUSCLE LENGTH: Hamstrings: Right  deg; Left  deg Maisie Fus test: Right  deg; Left  deg  POSTURE: rounded shoulders and posterior pelvic tilt  PALPATION: Bil lumbar scars. Tigger point in Bil paraspinals from T8-SI  joint.   LUMBAR ROM:   AROM eval  Flexion Limited due to Fusion  Extension 5 deg  Right lateral flexion 12  Left lateral flexion 10  Right rotation   Left rotation    (Blank rows = not tested)  LOWER EXTREMITY ROM:     Active  Right eval Left eval  Hip flexion    Hip extension    Hip abduction    Hip adduction    Hip internal rotation    Hip external rotation    Knee flexion    Knee extension    Ankle dorsiflexion    Ankle plantarflexion    Ankle inversion    Ankle eversion     (Blank rows = not tested)  LOWER EXTREMITY MMT:    MMT Right eval Left eval  Hip flexion 4 4  Hip  extension    Hip abduction 4+ 4  Hip adduction 4+ 4  Hip internal rotation    Hip external rotation    Knee flexion 4+ 4-  Knee extension 4+ 4  Ankle dorsiflexion 4+ 4+   Ankle plantarflexion    Ankle inversion    Ankle eversion     (Blank rows = not tested)  LUMBAR SPECIAL TESTS:  Straight leg raise test: Positive and Slump test: Positive  FUNCTIONAL TESTS:  5 times sit to stand:  25.44 heavy use of BUE on arm rests  Timed up and go (TUG): 17.14 6 minute walk test: 443ft 1 standing break. Unable to complete 6 min; Pt required to sit at 3:44. 10 meter walk test: 13.76 sec (0.64m/s)   GAIT: Distance walked: 75 Assistive device utilized: Single point cane Level of assistance: Modified independence Comments: antalgic, hip hike on the LLE, increased stance time on the LLE with use of SPC.   TODAY'S TREATMENT:  DATE: 03/01/2023  Pt has inability to be in prone or supine for any length of time  Pt tolerates L SL for R SL STM and IASTM this date  Manual STM and IASTM x 10 min to R lumber paraspinals and QL with multiple tender to palpation areas of soft tissue  Therapeutic Exercise: Standing knee to chest simulated stretch at steps 3 x 45 sec ea LE  Seated piriformis hold 30 sec x 2 each LE Seated therapy ball roll forward 5 x 30 sec holds  Seated therapeutic ball roll diagonal  x 5 bilateral  Dead lift technique progression with PVC post to pt whle performing hip hinge x 10 reps  - deadlift with dowel/PVC cue x 5 reps with 15# KB on 24 inch surface   Note: Portions of this document were prepared using Dragon voice recognition software and although reviewed may contain unintentional dictation errors in syntax, grammar, or spelling.    PATIENT EDUCATION:  Education details: POC.  Pt educated throughout session about proper posture and technique with  exercises. Improved exercise technique, movement at target joints, use of target muscles after min to mod verbal, visual, tactile cues.  Person educated: Patient Education method: Explanation Education comprehension: verbalized understanding and needs further education  HOME EXERCISE PROGRAM: Piriformis stretch 3 x 30 sec  Sciatic nerve glides 5 sec hold x 10 bil  Seated truncal extension within pain free range 2 x 10   ASSESSMENT:  CLINICAL IMPRESSION:  Pt presents with good motivation today.  Patient continues to report 8 out of 10 pain at rest. Pt progressed with dynamic core muscle activation with good result and progressed with functional lifting mechanics and appropriate muscle activation. Pt reports more benefit from flexion based stretching so this will continue to be a target as pt positional preferences tolerate. Pt showed progress with form with PVC for external cue showing improved hip hinge and no increased pain with the movement. Pt will continue to benefit from skilled physical therapy intervention to address impairments, improve QOL, and attain therapy goals.     OBJECTIVE IMPAIRMENTS: Abnormal gait, cardiopulmonary status limiting activity, decreased activity tolerance, decreased balance, decreased endurance, decreased mobility, difficulty walking, decreased ROM, decreased strength, hypomobility, increased fascial restrictions, impaired perceived functional ability, impaired flexibility, impaired sensation, improper body mechanics, postural dysfunction, obesity, and pain.   ACTIVITY LIMITATIONS: carrying, lifting, bending, sitting, standing, and sleeping  PARTICIPATION LIMITATIONS: driving, shopping, community activity, and yard work  PERSONAL FACTORS: Age, Fitness, Past/current experiences, Social background, and 1 comorbidity: Arthritis   are also affecting patient's functional outcome.   REHAB POTENTIAL: Fair history or chronic LBP -  CLINICAL DECISION MAKING:  Evolving/moderate complexity  EVALUATION COMPLEXITY: Moderate   GOALS:  SHORT TERM GOALS: Target date: 02/21/2023    Patient will be independent in home exercise program to improve strength/mobility for better functional independence with ADLs. Baseline: 02/13/2023- Patient reports able to perform his current HEP without significant issues.  Goal status: MET   LONG TERM GOALS: Target date: 04/04/2023    Patient will increase FOTO score to equal to or greater than   45  to demonstrate statistically significant improvement in mobility and quality of life.  Baseline: 32; 02/13/2023= 35 Goal status: Progressing  2.  Patient will reduce baseline pain level by 2 points to improve QoL and improve   Baseline: 8/10; 02/13/2023- 8/10 with pain meds Goal status: Unchanged  3.  Patient will increase walk test to >1015ft to demonstrate decreased fall risk  during functional activities and improved access to community Baseline: to be completed at later date. 01/17/2023= 440 feet in 3:44; 02/13/2023= 330 feet in 2 min 40 sec.   Goal status: ONGOING  4.  Patient will increase 10 meter walk test to >1.46m/s as to improve gait speed for better community ambulation and to reduce fall risk. Baseline: 0.14m/s; 02/13/2023= 0.63 m/s  Goal status: ONGOING  5.  Patient will reduce timed up and go to <11 seconds to reduce fall risk and demonstrate improved transfer/gait ability. Baseline: 17.14sec Goal status: INITIAL  6.  Patient will increase LLE to 4+/5 grossly to improve safety with community mobility and reduce risk of falls and independence with stair management.  Baseline: 4-/5 to 4/5; 02/13/2023= 4-/5 left hip flex/abd/knee ext/flex Goal status: Ongoing   PLAN:  PT FREQUENCY: 1-2x/week  PT DURATION: 12 weeks  PLANNED INTERVENTIONS: Therapeutic exercises, Therapeutic activity, Neuromuscular re-education, Balance training, Gait training, Patient/Family education, Self Care, Joint mobilization,  Joint manipulation, Stair training, DME instructions, Spinal manipulation, Spinal mobilization, Cryotherapy, Moist heat, scar mobilization, and Manual therapy.  PLAN FOR NEXT SESSION:  Core stabilization.  Education on posture.  Adjust HEP as needed.   Norman Herrlich PT  Physical Therapist- [place]  [facility]  8:22 AM 03/01/23

## 2023-03-06 ENCOUNTER — Ambulatory Visit: Payer: BC Managed Care – PPO

## 2023-03-06 DIAGNOSIS — R262 Difficulty in walking, not elsewhere classified: Secondary | ICD-10-CM

## 2023-03-06 DIAGNOSIS — M5416 Radiculopathy, lumbar region: Secondary | ICD-10-CM | POA: Diagnosis not present

## 2023-03-06 DIAGNOSIS — M5459 Other low back pain: Secondary | ICD-10-CM | POA: Diagnosis not present

## 2023-03-06 DIAGNOSIS — M6281 Muscle weakness (generalized): Secondary | ICD-10-CM

## 2023-03-06 NOTE — Therapy (Signed)
OUTPATIENT PHYSICAL THERAPY THORACOLUMBAR TREATMENT   Patient Name: Fred Tran MRN: [id number] DOB:29-Oct-1965, 57 y.o., male Today's Date: 03/06/2023  END OF SESSION:  PT End of Session - 03/06/23 0804     Visit Number 15    Number of Visits 24    Date for PT Re-Evaluation 04/04/23    Progress Note Due on Visit 20    PT Start Time 0804    PT Stop Time 0845    PT Time Calculation (min) 41 min    Activity Tolerance Patient tolerated treatment well;Patient limited by pain    Behavior During Therapy [facility] for tasks assessed/performed                   Past Medical History:  Diagnosis Date   Arthritis    Hyperlipidemia    Obesity (BMI 35.0-39.9 without comorbidity)    Tobacco abuse    Past Surgical History:  Procedure Laterality Date   ARTHRODESIS,ANTERIOR INTERBODY TECHNIQUE,INCLUDE MINIMAL DISKECTOMY TO PREPARE INTERSPACE; LUMBAR  01/29/2022   FINGER SURGERY Left    ring finger   KNEE SURGERY Right    Patient Active Problem List   Diagnosis Date Noted   Lumbar radiculopathy 12/08/2021   Other intervertebral disc degeneration, lumbar region 10/26/2020   Adjustment disorder with mixed anxiety and depressed mood 04/03/2019   Obesity (BMI 35.0-39.9 without comorbidity)    Tobacco abuse    Hyperlipidemia     PCP: Dorcas Carrow, DO   REFERRING PROVIDER: Dorcas Carrow, DO   REFERRING DIAG:  Diagnosis  M54.16 (ICD-10-CM) - Lumbar radiculopathy    Rationale for Evaluation and Treatment: Rehabilitation  THERAPY DIAG:  Other low back pain  Radiculopathy, lumbar region  Muscle weakness (generalized)  Difficulty in walking, not elsewhere classified  ONSET DATE: several years with multiple surgeries   SUBJECTIVE:                                                                                                                                                                                           SUBJECTIVE STATEMENT:  Pt arrives stating  8/10 pain level at this time.  Pt reports he has not felt a large difference in pain since he has started PT.  Pt notes that he has been doing HEP fairly consistently, but it's still not helping.   PERTINENT HISTORY:  Fred Tran is a 57 y.o. male with a PMHx significant for arthritis and s/p L3-4 XLIF/revision PI on 01/29/22.  The lumbar spine examination shows posterior fusion from the level of C6 L4 to through L4. Disc spacers are noted at the level of  L2-L3 and L3-L4  PAIN:  Are you having pain? Yes: NPRS scale: 8/10 Pain location: L hip and lower R back   Pain description: burning/stabbing   Aggravating factors: constant Relieving factors: sitting still provides some short tern relief.   PRECAUTIONS: None and Other: history of back precautions following each surgery   WEIGHT BEARING RESTRICTIONS: No  FALLS:  Has patient fallen in last 6 months? Yes. Number of falls at least 6. Reports that L knee "gives out"  LIVING ENVIRONMENT: Lives with: lives with their spouse Lives in: Mobile home Stairs: Yes: External: 5 steps; on right going up, on left going up, and can reach both Has following equipment at home: Single point cane and has RW but does not use   OCCUPATION: disability   PLOF: Independent with household mobility without device and Requires assistive device for independence use of SPC in community   PATIENT GOALS: pain relief   NEXT MD VISIT: 02/11/2023 with Dr Laural Benes. 6/24: pain management follow up visit.   OBJECTIVE:   DIAGNOSTIC FINDINGS:  Imaging/Tests: MRI Cervical Thoracic Lumbar Spine 07/23/22 FINDINGS: The C2-C3 disc level is within normal limits.   The C3-C4 disc level shows hypertrophy of the left uncovertebral joint leading to moderate stenosis of at the corresponding neural foramen.   The C4-C5 level shows minimal posterior disc osteophyte formation but is otherwise within normal limits.   The C5-C6 level is degraded by motion from shoulders  but shows posterior disc osteophyte formation with some compression of the ventral subarachnoid space and moderate narrowing of the neural foramina bilaterally and symmetrically.   The C6 vertebral body shows high T2 signal intensity. C7 vertebral body shows similar findings in the disc space between these 2 vertebral bodies is somewhat bright. Presumably this is degenerative in nature. Axial images are degraded by artifact from shoulders and cannot be adequately evaluated but there appears to be posterior disc osteophyte formation resulting in a borderline spinal canal diameter.   The C7-T1 disc level appears to be within normal limits.   The structures at the level of the foramen magnum are normal. The signal intensity from the spinal cord is unremarkable.   Thoracic spine shows kyphosis as well as minimal anterior wedging of the T7 and possibly T8 vertebral bodies. There are posterior disc bulges indenting the ventral subarachnoid space extending from the level of T5-T6 to T8-T9. There is no significant spinal canal stenosis. The spinal cord appears to be somewhat diminished in its caliber but is otherwise within normal limits.   The lumbar spine examination shows posterior fusion from the level of C6 L4 to through L4. Disc spacers are noted at the level of L2-L3 and L3-L4. There is now an additional caudal level of fusion when compared to the previous examination.   Metal artifact obstructs most of the view through the region of the fusion. Please note that axial images were not obtained through the lumbar spine as the patient requested to be evacuated from the scanner. There is a posterior disc herniation at the level of L2-L3 which was present previously and is unchanged. There is now what appears to be a diffuse disc bulge with a protrusion especially to the left at the L4-L5 level which has increased in severity when compared to the prior study.  IMPRESSION Degenerative changes in the cervical  spine with no evidence of significant spinal canal stenosis.   No evidence of spinal canal stenosis in the thoracic region.   Interval additional level of  fusion at the level of L3-L4 when compared to the prior study. Interval progression of degenerative changes at the level of L4-L5. Incomplete study.   XR Lumbar Spine 07/10/22 FINDINGS:  Redemonstration of posterior fusion from L2-L4 with intervertebral disc spacers. No perihardware lucency or fracture. Unchanged alignment. Intervertebral disc space narrowing at L4-L5 and L5-S1 levels. Facetal arthropathy at L1-L2 and L5-S1 levels.  IMPRESSION Posterior fusion L2-L4 with intervertebral disc spacers. No interval/new hardware complications.  Moderate degenerative disease of the lumbar spine at the nonfused levels.    PATIENT SURVEYS:  Modified Oswestry 28/50 56%   FOTO 32  SCREENING FOR RED FLAGS: Bowel or bladder incontinence: No Spinal tumors: No Cauda equina syndrome: No Compression fracture: No Abdominal aneurysm: No  COGNITION: Overall cognitive status: Within functional limits for tasks assessed     SENSATION: Light touch: Impaired   MUSCLE LENGTH: Hamstrings: Right  deg; Left  deg Maisie Fus test: Right  deg; Left  deg  POSTURE: rounded shoulders and posterior pelvic tilt  PALPATION: Bil lumbar scars. Tigger point in Bil paraspinals from T8-SI  joint.   LUMBAR ROM:   AROM eval  Flexion Limited due to Fusion  Extension 5 deg  Right lateral flexion 12  Left lateral flexion 10  Right rotation   Left rotation    (Blank rows = not tested)  LOWER EXTREMITY ROM:     Active  Right eval Left eval  Hip flexion    Hip extension    Hip abduction    Hip adduction    Hip internal rotation    Hip external rotation    Knee flexion    Knee extension    Ankle dorsiflexion    Ankle plantarflexion    Ankle inversion    Ankle eversion     (Blank rows = not tested)  LOWER EXTREMITY MMT:    MMT Right eval  Left eval  Hip flexion 4 4  Hip extension    Hip abduction 4+ 4  Hip adduction 4+ 4  Hip internal rotation    Hip external rotation    Knee flexion 4+ 4-  Knee extension 4+ 4  Ankle dorsiflexion 4+ 4+   Ankle plantarflexion    Ankle inversion    Ankle eversion     (Blank rows = not tested)  LUMBAR SPECIAL TESTS:  Straight leg raise test: Positive and Slump test: Positive  FUNCTIONAL TESTS:  5 times sit to stand:  25.44 heavy use of BUE on arm rests  Timed up and go (TUG): 17.14 6 minute walk test: 467ft 1 standing break. Unable to complete 6 min; Pt required to sit at 3:44. 10 meter walk test: 13.76 sec (0.67m/s)   GAIT: Distance walked: 75 Assistive device utilized: Single point cane Level of assistance: Modified independence Comments: antalgic, hip hike on the LLE, increased stance time on the LLE with use of SPC.   TODAY'S TREATMENT: DATE: 03/06/2023   TherAct:  Goal assessment performed and noted below:    PATIENT EDUCATION:  Education details: POC.  Pt educated throughout session about proper posture and technique with exercises. Improved exercise technique, movement at target joints, use of target muscles after min to mod verbal, visual, tactile cues.  Person educated: Patient Education method: Explanation Education comprehension: verbalized understanding and needs further education  HOME EXERCISE PROGRAM: Piriformis stretch 3 x 30 sec  Sciatic nerve glides 5 sec hold x 10 bil  Seated truncal extension within pain free range 2 x 10   ASSESSMENT:  CLINICAL IMPRESSION:  Pt educated on visit limits and lack of progress at this time.  Pt notes that he has not made a lot of progress and is waiting on surgically appointment in August.  Pt is limited to 30 visits in this calendar year and has used 15 visits.  Pt and therapist agreed to conserve those visits in order to utilize them following surgical intervention if it occurs.  Pt agreed to this change in plan  and is d/c at this time.  Pt advised to contact clinic if any complications or questions occurs.    MD secure chat messaged about progress and potentially moving up future appointments.    OBJECTIVE IMPAIRMENTS: Abnormal gait, cardiopulmonary status limiting activity, decreased activity tolerance, decreased balance, decreased endurance, decreased mobility, difficulty walking, decreased ROM, decreased strength, hypomobility, increased fascial restrictions, impaired perceived functional ability, impaired flexibility, impaired sensation, improper body mechanics, postural dysfunction, obesity, and pain.   ACTIVITY LIMITATIONS: carrying, lifting, bending, sitting, standing, and sleeping  PARTICIPATION LIMITATIONS: driving, shopping, community activity, and yard work  PERSONAL FACTORS: Age, Fitness, Past/current experiences, Social background, and 1 comorbidity: Arthritis   are also affecting patient's functional outcome.   REHAB POTENTIAL: Fair history or chronic LBP -  CLINICAL DECISION MAKING: Evolving/moderate complexity  EVALUATION COMPLEXITY: Moderate   GOALS:  SHORT TERM GOALS: Target date: 02/21/2023    Patient will be independent in home exercise program to improve strength/mobility for better functional independence with ADLs. Baseline: 02/13/2023- Patient reports able to perform his current HEP without significant issues.  Goal status: MET   LONG TERM GOALS: Target date: 04/04/2023    Patient will increase FOTO score to equal to or greater than   45  to demonstrate statistically significant improvement in mobility and quality of life.  Baseline: 32;  02/13/2023= 35 03/06/23: 26 Goal status: Progressing  2.  Patient will reduce baseline pain level by 2 points to improve QoL and improve   Baseline: 8/10;  02/13/2023: 8/10 with pain meds 03/06/23: 8/10 with pain meds Goal status: Unchanged  3.  Patient will increase walk test to >1051ft to demonstrate decreased fall risk  during functional activities and improved access to community Baseline: to be completed at later date.  01/17/2023: 440 feet in 3:44;  02/13/2023: 330 feet in 2 min 40 sec.   03/06/23:  Goal status: ONGOING  4.  Patient will increase 10 meter walk test to >1.72m/s as to improve gait speed for better community ambulation and to reduce fall risk. Baseline: 0.88m/s;  02/13/2023= 0.63 m/s  03/06/23: 0.69 m/s Goal status: ONGOING  5.  Patient will reduce timed up and go to <11 seconds to reduce fall risk and demonstrate improved transfer/gait ability. Baseline: 17.14sec 03/06/23: 22.55 sec Goal status: INITIAL  6.  Patient will increase LLE to 4+/5 grossly to improve safety with community mobility and reduce risk of falls and independence with stair management.  Baseline: 4-/5 to 4/5;  02/13/2023: 4-/5 left hip flex/abd/knee ext/flex 03/06/23: 4/6 L hip flex, 4+ L knee ext/flex Goal status: Ongoing   PLAN:  PT FREQUENCY: 1-2x/week  PT DURATION: 12 weeks  PLANNED INTERVENTIONS: Therapeutic exercises, Therapeutic activity, Neuromuscular re-education, Balance training, Gait training, Patient/Family education, Self Care, Joint mobilization, Joint manipulation, Stair training, DME instructions, Spinal manipulation, Spinal mobilization, Cryotherapy, Moist heat, scar mobilization, and Manual therapy.  PLAN FOR NEXT SESSION:  Core stabilization.  Education on posture.  Adjust HEP as needed.    Nolon Bussing, PT, DPT Physical Therapist -  [facility]  [facility]  03/06/23, 8:36 AM

## 2023-03-08 ENCOUNTER — Ambulatory Visit: Payer: BC Managed Care – PPO | Admitting: Physical Therapy

## 2023-03-11 DIAGNOSIS — M5416 Radiculopathy, lumbar region: Secondary | ICD-10-CM | POA: Diagnosis not present

## 2023-03-13 ENCOUNTER — Ambulatory Visit: Payer: BC Managed Care – PPO | Admitting: Physical Therapy

## 2023-03-15 ENCOUNTER — Ambulatory Visit: Payer: BC Managed Care – PPO | Admitting: Physical Therapy

## 2023-03-20 ENCOUNTER — Ambulatory Visit: Payer: BC Managed Care – PPO | Admitting: Physical Therapy

## 2023-03-22 ENCOUNTER — Ambulatory Visit: Payer: BC Managed Care – PPO | Admitting: Physical Therapy

## 2023-03-27 ENCOUNTER — Ambulatory Visit: Payer: BC Managed Care – PPO | Admitting: Physical Therapy

## 2023-03-29 ENCOUNTER — Ambulatory Visit: Payer: BC Managed Care – PPO | Admitting: Physical Therapy

## 2023-04-03 ENCOUNTER — Ambulatory Visit: Payer: BC Managed Care – PPO | Admitting: Physical Therapy

## 2023-04-05 ENCOUNTER — Ambulatory Visit: Payer: BC Managed Care – PPO | Admitting: Physical Therapy

## 2023-04-10 ENCOUNTER — Ambulatory Visit: Payer: BC Managed Care – PPO | Admitting: Physical Therapy

## 2023-04-10 DIAGNOSIS — M7581 Other shoulder lesions, right shoulder: Secondary | ICD-10-CM | POA: Diagnosis not present

## 2023-04-10 DIAGNOSIS — M19011 Primary osteoarthritis, right shoulder: Secondary | ICD-10-CM | POA: Diagnosis not present

## 2023-04-10 DIAGNOSIS — M25511 Pain in right shoulder: Secondary | ICD-10-CM | POA: Diagnosis not present

## 2023-04-10 DIAGNOSIS — M19012 Primary osteoarthritis, left shoulder: Secondary | ICD-10-CM | POA: Diagnosis not present

## 2023-04-11 DIAGNOSIS — M5416 Radiculopathy, lumbar region: Secondary | ICD-10-CM | POA: Diagnosis not present

## 2023-04-12 ENCOUNTER — Ambulatory Visit: Payer: BC Managed Care – PPO | Admitting: Physical Therapy

## 2023-04-16 ENCOUNTER — Encounter: Payer: Self-pay | Admitting: Family Medicine

## 2023-04-16 ENCOUNTER — Ambulatory Visit: Payer: BC Managed Care – PPO | Admitting: Family Medicine

## 2023-04-16 VITALS — BP 133/80 | HR 65 | Temp 97.8°F | Wt 300.6 lb

## 2023-04-16 DIAGNOSIS — E782 Mixed hyperlipidemia: Secondary | ICD-10-CM

## 2023-04-16 DIAGNOSIS — Z0000 Encounter for general adult medical examination without abnormal findings: Secondary | ICD-10-CM | POA: Diagnosis not present

## 2023-04-16 DIAGNOSIS — M5136 Other intervertebral disc degeneration, lumbar region: Secondary | ICD-10-CM | POA: Diagnosis not present

## 2023-04-16 DIAGNOSIS — Z23 Encounter for immunization: Secondary | ICD-10-CM

## 2023-04-16 LAB — URINALYSIS, ROUTINE W REFLEX MICROSCOPIC
Bilirubin, UA: NEGATIVE
Glucose, UA: NEGATIVE
Ketones, UA: NEGATIVE
Leukocytes,UA: NEGATIVE
Nitrite, UA: NEGATIVE
Protein,UA: NEGATIVE
RBC, UA: NEGATIVE
Specific Gravity, UA: 1.015 (ref 1.005–1.030)
Urobilinogen, Ur: 0.2 mg/dL (ref 0.2–1.0)
pH, UA: 7 (ref 5.0–7.5)

## 2023-04-16 NOTE — Progress Notes (Signed)
BP 133/80   Pulse 65   Temp 97.8 F (36.6 C) (Oral)   Wt (!) 300 lb 9.6 oz (136.4 kg)   SpO2 97%   BMI 40.27 kg/m    Subjective:    Patient ID: Fred Tran, male    DOB: [birth year], 57 y.o.   MRN: [id number]  HPI: Fred Tran is a 57 y.o. male presenting on 04/16/2023 for comprehensive medical examination. Current medical complaints include:  CHRONIC PAIN  Pain control status: stable Duration: chronic Location: low back bilaterally Quality: shooting, aching Current Pain Level: moderate Previous Pain Level: severe Breakthrough pain: yes Benefit from narcotic medications: N/A What Activities task can be accomplished with current medication? Able to do ADLs Interested in weaning off narcotics:N/A   Stool softners/OTC fiber: yes  Previous pain specialty evaluation: yes Non-narcotic analgesic meds: yes Narcotic contract: N/A  He currently lives with: wife, kids and grandkids Interim Problems from his last visit: no  Depression Screen done today and results listed below:     04/16/2023    9:51 AM 07/16/2022    9:15 AM 06/15/2022    8:12 AM 01/04/2022   10:01 AM 12/08/2021    1:38 PM  Depression screen PHQ 2/9  Decreased Interest 0 0 0 0 1  Down, Depressed, Hopeless 0 1 1 1  0  PHQ - 2 Score 0 1 1 1 1   Altered sleeping 0 0 1 1 0  Tired, decreased energy 0 1 1 1 1   Change in appetite 0 1 0 0 0  Feeling bad or failure about yourself  0 0 0 0 0  Trouble concentrating 0 0 0 0 0  Moving slowly or fidgety/restless 0 0 0 0 0  Suicidal thoughts 0 0 0 0 0  PHQ-9 Score 0 3 3 3 2   Difficult doing work/chores Not difficult at all Not difficult at all Somewhat difficult Somewhat difficult     Past Medical History:  Past Medical History:  Diagnosis Date   Arthritis    Hyperlipidemia    Obesity (BMI 35.0-39.9 without comorbidity)    Tobacco abuse     Surgical History:  Past Surgical History:  Procedure Laterality Date   ARTHRODESIS,ANTERIOR INTERBODY  TECHNIQUE,INCLUDE MINIMAL DISKECTOMY TO PREPARE INTERSPACE; LUMBAR  01/29/2022   FINGER SURGERY Left    ring finger   KNEE SURGERY Right     Medications:  Current Outpatient Medications on File Prior to Visit  Medication Sig   baclofen (LIORESAL) 20 MG tablet Take 20 mg by mouth 1 day or 1 dose.   DULoxetine (CYMBALTA) 30 MG capsule Take by mouth.   fexofenadine (ALLEGRA ALLERGY) 180 MG tablet Take 1 tablet (180 mg total) by mouth daily.   meloxicam (MOBIC) 15 MG tablet Take 1 tablet (15 mg total) by mouth daily.   nicotine (NICODERM CQ - DOSED IN MG/24 HOURS) 21 mg/24hr patch Place onto the skin.   nicotine polacrilex (COMMIT) 4 MG lozenge SMARTSIG:1 Lozenge(s) By Mouth Every 2 Hours PRN   pregabalin (LYRICA) 100 MG capsule Take by mouth.   No current facility-administered medications on file prior to visit.    Allergies:  No Known Allergies  Social History:  Social History   Socioeconomic History   Marital status: Married    Spouse name: Not on file   Number of children: Not on file   Years of education: Not on file   Highest education level: GED or equivalent  Occupational History   Not on file  Tobacco Use   Smoking status: Every Day    Current packs/day: 1.00    Average packs/day: 1 pack/day for 35.0 years (35.0 ttl pk-yrs)    Types: Cigarettes   Smokeless tobacco: Never  Vaping Use   Vaping status: Never Used  Substance and Sexual Activity   Alcohol use: No   Drug use: No   Sexual activity: Yes  Other Topics Concern   Not on file  Social History Narrative   Not on file   Social Determinants of Health   Financial Resource Strain: Patient Declined (01/30/2023)   Overall Financial Resource Strain (CARDIA)    Difficulty of Paying Living Expenses: Patient declined  Food Insecurity: Patient Declined (01/30/2023)   Hunger Vital Sign    Worried About Running Out of Food in the Last Year: Patient declined    Ran Out of Food in the Last Year: Patient declined   Transportation Needs: No Transportation Needs (01/30/2023)   PRAPARE - Administrator, Civil Service (Medical): No    Lack of Transportation (Non-Medical): No  Physical Activity: Unknown (01/30/2023)   Exercise Vital Sign    Days of Exercise per Week: 0 days    Minutes of Exercise per Session: Not on file  Stress: No Stress Concern Present (01/30/2023)   [employer] of Occupational Health - Occupational Stress Questionnaire    Feeling of Stress : Only a little  Social Connections: Moderately Isolated (01/30/2023)   Social Connection and Isolation Panel [NHANES]    Frequency of Communication with Friends and Family: Three times a week    Frequency of Social Gatherings with Friends and Family: Once a week    Attends Religious Services: Never    Database administrator or Organizations: No    Attends Engineer, structural: Not on file    Marital Status: Married  Catering manager Violence: Not on file   Social History   Tobacco Use  Smoking Status Every Day   Current packs/day: 1.00   Average packs/day: 1 pack/day for 35.0 years (35.0 ttl pk-yrs)   Types: Cigarettes  Smokeless Tobacco Never   Social History   Substance and Sexual Activity  Alcohol Use No    Family History:  Family History  Problem Relation Age of Onset   Arthritis Mother    Hyperlipidemia Mother    Hypertension Mother    Mental illness Mother    Thyroid disease Mother    Alcohol abuse Father    Drug abuse Father     Past medical history, surgical history, medications, allergies, family history and social history reviewed with patient today and changes made to appropriate areas of the chart.   Review of Systems  Constitutional:  Positive for diaphoresis. Negative for chills, fever, malaise/fatigue and weight loss.  HENT: Negative.    Eyes: Negative.   Respiratory: Negative.    Cardiovascular:  Positive for leg swelling (L leg occasionally). Negative for chest pain, palpitations,  orthopnea, claudication and PND.  Gastrointestinal: Negative.   Genitourinary: Negative.   Musculoskeletal:  Positive for back pain, joint pain and myalgias. Negative for falls and neck pain.  Skin: Negative.   Neurological: Negative.   Endo/Heme/Allergies:  Negative for environmental allergies and polydipsia. Bruises/bleeds easily.  Psychiatric/Behavioral: Negative.     All other ROS negative except what is listed above and in the HPI.      Objective:    BP 133/80   Pulse 65   Temp 97.8 F (36.6 C) (Oral)  Wt (!) 300 lb 9.6 oz (136.4 kg)   SpO2 97%   BMI 40.27 kg/m   Wt Readings from Last 3 Encounters:  04/16/23 (!) 300 lb 9.6 oz (136.4 kg)  01/31/23 296 lb 12.8 oz (134.6 kg)  12/11/22 294 lb 14.4 oz (133.8 kg)    Physical Exam Vitals and nursing note reviewed.  Constitutional:      General: He is not in acute distress.    Appearance: Normal appearance. He is obese. He is not ill-appearing, toxic-appearing or diaphoretic.  HENT:     Head: Normocephalic and atraumatic.     Right Ear: Tympanic membrane, ear canal and external ear normal. There is no impacted cerumen.     Left Ear: Tympanic membrane, ear canal and external ear normal. There is no impacted cerumen.     Nose: Nose normal. No congestion or rhinorrhea.     Mouth/Throat:     Mouth: Mucous membranes are moist.     Pharynx: Oropharynx is clear. No oropharyngeal exudate or posterior oropharyngeal erythema.  Eyes:     General: No scleral icterus.       Right eye: No discharge.        Left eye: No discharge.     Extraocular Movements: Extraocular movements intact.     Conjunctiva/sclera: Conjunctivae normal.     Pupils: Pupils are equal, round, and reactive to light.  Neck:     Vascular: No carotid bruit.  Cardiovascular:     Rate and Rhythm: Normal rate and regular rhythm.     Pulses: Normal pulses.     Heart sounds: No murmur heard.    No friction rub. No gallop.  Pulmonary:     Effort: Pulmonary effort  is normal. No respiratory distress.     Breath sounds: Normal breath sounds. No stridor. No wheezing, rhonchi or rales.  Chest:     Chest wall: No tenderness.  Abdominal:     General: Abdomen is flat. Bowel sounds are normal. There is no distension.     Palpations: Abdomen is soft. There is no mass.     Tenderness: There is no abdominal tenderness. There is no right CVA tenderness, left CVA tenderness, guarding or rebound.     Hernia: No hernia is present.  Genitourinary:    Comments: Genital exam deferred with shared decision making Musculoskeletal:        General: No swelling, tenderness, deformity or signs of injury.     Cervical back: Normal range of motion and neck supple. No rigidity. No muscular tenderness.     Right lower leg: No edema.     Left lower leg: No edema.  Lymphadenopathy:     Cervical: No cervical adenopathy.  Skin:    General: Skin is warm and dry.     Capillary Refill: Capillary refill takes less than 2 seconds.     Coloration: Skin is not jaundiced or pale.     Findings: No bruising, erythema, lesion or rash.  Neurological:     General: No focal deficit present.     Mental Status: He is alert and oriented to person, place, and time.     Cranial Nerves: No cranial nerve deficit.     Sensory: No sensory deficit.     Motor: No weakness.     Coordination: Coordination normal.     Gait: Gait normal.     Deep Tendon Reflexes: Reflexes normal.  Psychiatric:        Mood and Affect: Mood normal.  Behavior: Behavior normal.        Thought Content: Thought content normal.        Judgment: Judgment normal.     Results for orders placed or performed in visit on 04/16/23  Comprehensive metabolic panel  Result Value Ref Range   Glucose 132 (H) 70 - 99 mg/dL   BUN 14 6 - 24 mg/dL   Creatinine, Ser 2.13 0.76 - 1.27 mg/dL   eGFR 086 >57 QI/ONG/2.95   BUN/Creatinine Ratio 16 9 - 20   Sodium 142 134 - 144 mmol/L   Potassium 4.4 3.5 - 5.2 mmol/L   Chloride  104 96 - 106 mmol/L   CO2 24 20 - 29 mmol/L   Calcium 9.4 8.7 - 10.2 mg/dL   Total Protein 6.9 6.0 - 8.5 g/dL   Albumin 4.5 3.8 - 4.9 g/dL   Globulin, Total 2.4 1.5 - 4.5 g/dL   Bilirubin Total 0.3 0.0 - 1.2 mg/dL   Alkaline Phosphatase 92 44 - 121 IU/L   AST 19 0 - 40 IU/L   ALT 19 0 - 44 IU/L  CBC with Differential/Platelet  Result Value Ref Range   WBC 12.6 (H) 3.4 - 10.8 x10E3/uL   RBC 5.14 4.14 - 5.80 x10E6/uL   Hemoglobin 16.0 13.0 - 17.7 g/dL   Hematocrit 28.4 13.2 - 51.0 %   MCV 92 79 - 97 fL   MCH 31.1 26.6 - 33.0 pg   MCHC 33.9 31.5 - 35.7 g/dL   RDW 44.0 10.2 - 72.5 %   Platelets 284 150 - 450 x10E3/uL   Neutrophils 63 Not Estab. %   Lymphs 25 Not Estab. %   Monocytes 8 Not Estab. %   Eos 3 Not Estab. %   Basos 0 Not Estab. %   Neutrophils Absolute 7.9 (H) 1.4 - 7.0 x10E3/uL   Lymphocytes Absolute 3.2 (H) 0.7 - 3.1 x10E3/uL   Monocytes Absolute 1.1 (H) 0.1 - 0.9 x10E3/uL   EOS (ABSOLUTE) 0.3 0.0 - 0.4 x10E3/uL   Basophils Absolute 0.0 0.0 - 0.2 x10E3/uL   Immature Granulocytes 1 Not Estab. %   Immature Grans (Abs) 0.1 0.0 - 0.1 x10E3/uL  Lipid Panel w/o Chol/HDL Ratio  Result Value Ref Range   Cholesterol, Total 216 (H) 100 - 199 mg/dL   Triglycerides 366 0 - 149 mg/dL   HDL 51 >44 mg/dL   VLDL Cholesterol Cal 26 5 - 40 mg/dL   LDL Chol Calc (NIH) 034 (H) 0 - 99 mg/dL  PSA  Result Value Ref Range   Prostate Specific Ag, Serum 0.5 0.0 - 4.0 ng/mL  TSH  Result Value Ref Range   TSH 2.880 0.450 - 4.500 uIU/mL  Urinalysis, Routine w reflex microscopic  Result Value Ref Range   Specific Gravity, UA 1.015 1.005 - 1.030   pH, UA 7.0 5.0 - 7.5   Color, UA Yellow Yellow   Appearance Ur Clear Clear   Leukocytes,UA Negative Negative   Protein,UA Negative Negative/Trace   Glucose, UA Negative Negative   Ketones, UA Negative Negative   RBC, UA Negative Negative   Bilirubin, UA Negative Negative   Urobilinogen, Ur 0.2 0.2 - 1.0 mg/dL   Nitrite, UA Negative  Negative   Microscopic Examination Comment       Assessment & Plan:   Problem List Items Addressed This Visit       Musculoskeletal and Integument   Other intervertebral disc degeneration, lumbar region    Continue to follow with pain management.  Continue out of work. Will fill out his paperwork. Call with any concerns.       Relevant Medications   meloxicam (MOBIC) 15 MG tablet     Other   Hyperlipidemia    Rechecking labs today. Await results. Treat as needed.       Other Visit Diagnoses     Routine general medical examination at a health care facility    -  Primary   Vaccines up to date. Screening labs checked today. Cologuard at home. Continue diet and exercise. Call with any concerns.   Relevant Orders   Comprehensive metabolic panel (Completed)   CBC with Differential/Platelet (Completed)   Lipid Panel w/o Chol/HDL Ratio (Completed)   PSA (Completed)   TSH (Completed)   Urinalysis, Routine w reflex microscopic (Completed)        LABORATORY TESTING:  Health maintenance labs ordered today as discussed above.   The natural history of prostate cancer and ongoing controversy regarding screening and potential treatment outcomes of prostate cancer has been discussed with the patient. The meaning of a false positive PSA and a false negative PSA has been discussed. He indicates understanding of the limitations of this screening test and wishes to proceed with screening PSA testing.   IMMUNIZATIONS:   - Tdap: Tetanus vaccination status reviewed: last tetanus booster within 10 years. - Influenza: Postponed to flu season - Pneumovax: Up to date - Prevnar: Not applicable - COVID: Not applicable - HPV: Not applicable - Shingrix vaccine: Up to date  SCREENING: - Colonoscopy:  encouraged him to send back   Discussed with patient purpose of the colonoscopy is to detect colon cancer at curable precancerous or early stages    PATIENT COUNSELING:    Sexuality: Discussed  sexually transmitted diseases, partner selection, use of condoms, avoidance of unintended pregnancy  and contraceptive alternatives.   Advised to avoid cigarette smoking.  I discussed with the patient that most people either abstain from alcohol or drink within safe limits (<=14/week and <=4 drinks/occasion for males, <=7/weeks and <= 3 drinks/occasion for females) and that the risk for alcohol disorders and other health effects rises proportionally with the number of drinks per week and how often a drinker exceeds daily limits.  Discussed cessation/primary prevention of drug use and availability of treatment for abuse.   Diet: Encouraged to adjust caloric intake to maintain  or achieve ideal body weight, to reduce intake of dietary saturated fat and total fat, to limit sodium intake by avoiding high sodium foods and not adding table salt, and to maintain adequate dietary potassium and calcium preferably from fresh fruits, vegetables, and low-fat dairy products.    stressed the importance of regular exercise  Injury prevention: Discussed safety belts, safety helmets, smoke detector, smoking near bedding or upholstery.   Dental health: Discussed importance of regular tooth brushing, flossing, and dental visits.   Follow up plan: NEXT PREVENTATIVE PHYSICAL DUE IN 1 YEAR. Return in about 3 months (around 07/17/2023).

## 2023-04-17 ENCOUNTER — Ambulatory Visit: Payer: BC Managed Care – PPO | Admitting: Physical Therapy

## 2023-04-19 ENCOUNTER — Ambulatory Visit: Payer: BC Managed Care – PPO | Admitting: Physical Therapy

## 2023-04-20 ENCOUNTER — Encounter: Payer: Self-pay | Admitting: Family Medicine

## 2023-04-20 NOTE — Assessment & Plan Note (Signed)
Continue to follow with pain management. Continue out of work. Will fill out his paperwork. Call with any concerns.

## 2023-04-20 NOTE — Assessment & Plan Note (Signed)
Rechecking labs today. Await results. Treat as needed.  °

## 2023-04-24 ENCOUNTER — Ambulatory Visit: Payer: BC Managed Care – PPO | Admitting: Physical Therapy

## 2023-04-26 ENCOUNTER — Ambulatory Visit: Payer: BC Managed Care – PPO | Admitting: Physical Therapy

## 2023-05-01 ENCOUNTER — Ambulatory Visit: Payer: BC Managed Care – PPO | Admitting: Physical Therapy

## 2023-05-03 ENCOUNTER — Ambulatory Visit: Payer: BC Managed Care – PPO | Admitting: Physical Therapy

## 2023-05-08 ENCOUNTER — Ambulatory Visit: Payer: BC Managed Care – PPO | Admitting: Physical Therapy

## 2023-05-08 ENCOUNTER — Encounter: Payer: BC Managed Care – PPO | Admitting: Physical Therapy

## 2023-05-10 ENCOUNTER — Encounter: Payer: BC Managed Care – PPO | Admitting: Physical Therapy

## 2023-05-14 DIAGNOSIS — M5416 Radiculopathy, lumbar region: Secondary | ICD-10-CM | POA: Diagnosis not present

## 2023-05-15 ENCOUNTER — Encounter: Payer: BC Managed Care – PPO | Admitting: Physical Therapy

## 2023-05-15 ENCOUNTER — Ambulatory Visit: Payer: BC Managed Care – PPO | Admitting: Physical Therapy

## 2023-05-17 ENCOUNTER — Encounter: Payer: BC Managed Care – PPO | Admitting: Physical Therapy

## 2023-05-22 ENCOUNTER — Ambulatory Visit: Payer: BC Managed Care – PPO | Admitting: Physical Therapy

## 2023-05-22 ENCOUNTER — Encounter: Payer: BC Managed Care – PPO | Admitting: Physical Therapy

## 2023-05-24 ENCOUNTER — Encounter: Payer: BC Managed Care – PPO | Admitting: Physical Therapy

## 2023-05-29 ENCOUNTER — Ambulatory Visit: Payer: BC Managed Care – PPO | Admitting: Physical Therapy

## 2023-05-31 ENCOUNTER — Encounter: Payer: BC Managed Care – PPO | Admitting: Physical Therapy

## 2023-06-05 ENCOUNTER — Ambulatory Visit: Payer: BC Managed Care – PPO | Admitting: Physical Therapy

## 2023-06-07 ENCOUNTER — Encounter: Payer: BC Managed Care – PPO | Admitting: Physical Therapy

## 2023-06-11 DIAGNOSIS — Z419 Encounter for procedure for purposes other than remedying health state, unspecified: Secondary | ICD-10-CM | POA: Diagnosis not present

## 2023-06-12 ENCOUNTER — Ambulatory Visit: Payer: BC Managed Care – PPO | Admitting: Physical Therapy

## 2023-06-14 ENCOUNTER — Encounter: Payer: BC Managed Care – PPO | Admitting: Physical Therapy

## 2023-06-19 ENCOUNTER — Ambulatory Visit: Payer: BC Managed Care – PPO | Admitting: Physical Therapy

## 2023-06-21 ENCOUNTER — Encounter: Payer: BC Managed Care – PPO | Admitting: Physical Therapy

## 2023-06-26 ENCOUNTER — Ambulatory Visit: Payer: BC Managed Care – PPO | Admitting: Physical Therapy

## 2023-06-28 ENCOUNTER — Encounter: Payer: BC Managed Care – PPO | Admitting: Physical Therapy

## 2023-07-10 ENCOUNTER — Encounter: Payer: Self-pay | Admitting: Family Medicine

## 2023-07-10 ENCOUNTER — Ambulatory Visit (INDEPENDENT_AMBULATORY_CARE_PROVIDER_SITE_OTHER): Payer: Medicaid Other | Admitting: Family Medicine

## 2023-07-10 VITALS — BP 137/71 | HR 75 | Wt 306.6 lb

## 2023-07-10 DIAGNOSIS — D170 Benign lipomatous neoplasm of skin and subcutaneous tissue of head, face and neck: Secondary | ICD-10-CM

## 2023-07-10 DIAGNOSIS — Z720 Tobacco use: Secondary | ICD-10-CM

## 2023-07-10 DIAGNOSIS — M5416 Radiculopathy, lumbar region: Secondary | ICD-10-CM | POA: Diagnosis not present

## 2023-07-10 MED ORDER — VARENICLINE TARTRATE (STARTER) 0.5 MG X 11 & 1 MG X 42 PO TBPK: ORAL_TABLET | ORAL | 0 refills | Status: DC

## 2023-07-10 NOTE — Assessment & Plan Note (Signed)
Not doing great. Will continue to follow with ortho and pain management. They feel like he would benefit from spinal cord stimulator but he needs to quit smoking first- will help with this. Call with any concerns.

## 2023-07-10 NOTE — Progress Notes (Signed)
BP 137/71   Pulse 75   Wt (!) 306 lb 9.6 oz (139.1 kg)   SpO2 96%   BMI 41.08 kg/m    Subjective:    Patient ID: Fred Tran, male    DOB: Jul 22, 1966, 57 y.o.   MRN: [id number]  HPI: Fred Tran is a 57 y.o. male  Chief Complaint  Patient presents with   Back Pain   BACK PAIN Duration:  chronic Mechanism of injury: unknown Location: bilateral and low back Onset: gradual Severity: severe Quality: numb, shooting, sharp Frequency: constant Radiation: buttocks and L leg below the knee Aggravating factors: walking and moving Alleviating factors: nothing Status: stable Treatments attempted: lyrica, surgery, injections, medication  Relief with NSAIDs?: no Nighttime pain:  no Paresthesias / decreased sensation:  no Bowel / bladder incontinence:  no Fevers:  no Dysuria / urinary frequency:  no  SMOKING CESSATION Smoking Status: every day Smoking Amount: 1-1.5 packs per day Smoking Onset: 57yo Smoking Quit Date: not set Smoking triggers: stress, Cindy Type of tobacco use: cigarettes Children in the house: yes Other household members who smoke: yes Treatments attempted: wellbutrin   Relevant past medical, surgical, family and social history reviewed and updated as indicated. Interim medical history since our last visit reviewed. Allergies and medications reviewed and updated.  Review of Systems  Constitutional: Negative.   Respiratory: Negative.    Cardiovascular: Negative.   Gastrointestinal: Negative.   Musculoskeletal:  Positive for back pain and myalgias. Negative for arthralgias, gait problem, joint swelling, neck pain and neck stiffness.  Psychiatric/Behavioral: Negative.      Per HPI unless specifically indicated above     Objective:    BP 137/71   Pulse 75   Wt (!) 306 lb 9.6 oz (139.1 kg)   SpO2 96%   BMI 41.08 kg/m   Wt Readings from Last 3 Encounters:  07/10/23 (!) 306 lb 9.6 oz (139.1 kg)  04/16/23 (!) 300 lb 9.6 oz (136.4 kg)   01/31/23 296 lb 12.8 oz (134.6 kg)    Physical Exam Vitals and nursing note reviewed.  Constitutional:      General: He is not in acute distress.    Appearance: Normal appearance. He is obese. He is not ill-appearing, toxic-appearing or diaphoretic.  HENT:     Head: Normocephalic and atraumatic.     Right Ear: External ear normal.     Left Ear: External ear normal.     Nose: Nose normal.     Mouth/Throat:     Mouth: Mucous membranes are moist.     Pharynx: Oropharynx is clear.  Eyes:     General: No scleral icterus.       Right eye: No discharge.        Left eye: No discharge.     Extraocular Movements: Extraocular movements intact.     Conjunctiva/sclera: Conjunctivae normal.     Pupils: Pupils are equal, round, and reactive to light.  Cardiovascular:     Rate and Rhythm: Normal rate and regular rhythm.     Pulses: Normal pulses.     Heart sounds: Normal heart sounds. No murmur heard.    No friction rub. No gallop.  Pulmonary:     Effort: Pulmonary effort is normal. No respiratory distress.     Breath sounds: Normal breath sounds. No stridor. No wheezing, rhonchi or rales.  Chest:     Chest wall: No tenderness.  Musculoskeletal:        General: Normal range of motion.  Cervical back: Normal range of motion and neck supple.  Skin:    General: Skin is warm and dry.     Capillary Refill: Capillary refill takes less than 2 seconds.     Coloration: Skin is not jaundiced or pale.     Findings: No bruising, erythema, lesion or rash.     Comments: 2 inch lipoma in center of forehead  Neurological:     General: No focal deficit present.     Mental Status: He is alert and oriented to person, place, and time. Mental status is at baseline.  Psychiatric:        Mood and Affect: Mood normal.        Behavior: Behavior normal.        Thought Content: Thought content normal.        Judgment: Judgment normal.     Results for orders placed or performed in visit on 04/16/23   Comprehensive metabolic panel  Result Value Ref Range   Glucose 132 (H) 70 - 99 mg/dL   BUN 14 6 - 24 mg/dL   Creatinine, Ser 5.78 0.76 - 1.27 mg/dL   eGFR 469 >62 XB/MWU/1.32   BUN/Creatinine Ratio 16 9 - 20   Sodium 142 134 - 144 mmol/L   Potassium 4.4 3.5 - 5.2 mmol/L   Chloride 104 96 - 106 mmol/L   CO2 24 20 - 29 mmol/L   Calcium 9.4 8.7 - 10.2 mg/dL   Total Protein 6.9 6.0 - 8.5 g/dL   Albumin 4.5 3.8 - 4.9 g/dL   Globulin, Total 2.4 1.5 - 4.5 g/dL   Bilirubin Total 0.3 0.0 - 1.2 mg/dL   Alkaline Phosphatase 92 44 - 121 IU/L   AST 19 0 - 40 IU/L   ALT 19 0 - 44 IU/L  CBC with Differential/Platelet  Result Value Ref Range   WBC 12.6 (H) 3.4 - 10.8 x10E3/uL   RBC 5.14 4.14 - 5.80 x10E6/uL   Hemoglobin 16.0 13.0 - 17.7 g/dL   Hematocrit 44.0 10.2 - 51.0 %   MCV 92 79 - 97 fL   MCH 31.1 26.6 - 33.0 pg   MCHC 33.9 31.5 - 35.7 g/dL   RDW 72.5 36.6 - 44.0 %   Platelets 284 150 - 450 x10E3/uL   Neutrophils 63 Not Estab. %   Lymphs 25 Not Estab. %   Monocytes 8 Not Estab. %   Eos 3 Not Estab. %   Basos 0 Not Estab. %   Neutrophils Absolute 7.9 (H) 1.4 - 7.0 x10E3/uL   Lymphocytes Absolute 3.2 (H) 0.7 - 3.1 x10E3/uL   Monocytes Absolute 1.1 (H) 0.1 - 0.9 x10E3/uL   EOS (ABSOLUTE) 0.3 0.0 - 0.4 x10E3/uL   Basophils Absolute 0.0 0.0 - 0.2 x10E3/uL   Immature Granulocytes 1 Not Estab. %   Immature Grans (Abs) 0.1 0.0 - 0.1 x10E3/uL  Lipid Panel w/o Chol/HDL Ratio  Result Value Ref Range   Cholesterol, Total 216 (H) 100 - 199 mg/dL   Triglycerides 347 0 - 149 mg/dL   HDL 51 >42 mg/dL   VLDL Cholesterol Cal 26 5 - 40 mg/dL   LDL Chol Calc (NIH) 595 (H) 0 - 99 mg/dL  PSA  Result Value Ref Range   Prostate Specific Ag, Serum 0.5 0.0 - 4.0 ng/mL  TSH  Result Value Ref Range   TSH 2.880 0.450 - 4.500 uIU/mL  Urinalysis, Routine w reflex microscopic  Result Value Ref Range   Specific Gravity, UA 1.015 1.005 -  1.030   pH, UA 7.0 5.0 - 7.5   Color, UA Yellow Yellow    Appearance Ur Clear Clear   Leukocytes,UA Negative Negative   Protein,UA Negative Negative/Trace   Glucose, UA Negative Negative   Ketones, UA Negative Negative   RBC, UA Negative Negative   Bilirubin, UA Negative Negative   Urobilinogen, Ur 0.2 0.2 - 1.0 mg/dL   Nitrite, UA Negative Negative   Microscopic Examination Comment       Assessment & Plan:   Problem List Items Addressed This Visit       Nervous and Auditory   Lumbar radiculopathy    Not doing great. Will continue to follow with ortho and pain management. They feel like he would benefit from spinal cord stimulator but he needs to quit smoking first- will help with this. Call with any concerns.       Relevant Medications   Varenicline Tartrate, Starter, (CHANTIX STARTING MONTH PAK) 0.5 MG X 11 & 1 MG X 42 TBPK     Other   Tobacco abuse - Primary    Needs to quit to get spinal cord stimulator. Will start chantix and recheck 1 month. Call with any concerns.       Other Visit Diagnoses     Lipoma of face       Will refer to plastic surgery. Await their input.   Relevant Orders   Ambulatory referral to Plastic Surgery        Follow up plan: Return in about 4 weeks (around 08/07/2023).

## 2023-07-10 NOTE — Assessment & Plan Note (Signed)
Needs to quit to get spinal cord stimulator. Will start chantix and recheck 1 month. Call with any concerns.

## 2023-07-12 DIAGNOSIS — Z419 Encounter for procedure for purposes other than remedying health state, unspecified: Secondary | ICD-10-CM | POA: Diagnosis not present

## 2023-08-05 DIAGNOSIS — M5416 Radiculopathy, lumbar region: Secondary | ICD-10-CM | POA: Diagnosis not present

## 2023-08-07 ENCOUNTER — Encounter: Payer: Self-pay | Admitting: Plastic Surgery

## 2023-08-07 ENCOUNTER — Ambulatory Visit: Payer: Medicaid Other | Admitting: Plastic Surgery

## 2023-08-07 VITALS — BP 153/80 | HR 63 | Temp 98.0°F | Ht 72.0 in | Wt 305.0 lb

## 2023-08-07 DIAGNOSIS — R220 Localized swelling, mass and lump, head: Secondary | ICD-10-CM

## 2023-08-07 DIAGNOSIS — D489 Neoplasm of uncertain behavior, unspecified: Secondary | ICD-10-CM

## 2023-08-07 NOTE — Progress Notes (Signed)
Referring Provider Dorcas Carrow, DO [address] [place],  [place] [address]   CC:  Chief Complaint  Patient presents with   Advice Only      Fred Tran is an 57 y.o. male.  HPI: History Fred Tran is a 57 year old male who presents today with complaints of a mass on his forehead he states the mass has been there for approximately 8 years and has grown slightly over that period of time.  It does not cause him any pain and there is no history of infection.  He does state that he hit the area years ago while working.  He would like to have the mass removed.  No Known Allergies  Outpatient Encounter Medications as of 08/07/2023  Medication Sig   baclofen (LIORESAL) 20 MG tablet Take 20 mg by mouth 1 day or 1 dose.   DULoxetine (CYMBALTA) 30 MG capsule Take by mouth.   fexofenadine (ALLEGRA ALLERGY) 180 MG tablet Take 1 tablet (180 mg total) by mouth daily.   meloxicam (MOBIC) 15 MG tablet Take 1 tablet (15 mg total) by mouth daily.   nicotine (NICODERM CQ - DOSED IN MG/24 HOURS) 21 mg/24hr patch Place onto the skin.   nicotine polacrilex (COMMIT) 4 MG lozenge SMARTSIG:1 Lozenge(s) By Mouth Every 2 Hours PRN   pregabalin (LYRICA) 100 MG capsule Take by mouth.   Varenicline Tartrate, Starter, (CHANTIX STARTING MONTH PAK) 0.5 MG X 11 & 1 MG X 42 TBPK Take as directed   No facility-administered encounter medications on file as of 08/07/2023.     Past Medical History:  Diagnosis Date   Arthritis    Hyperlipidemia    Obesity (BMI 35.0-39.9 without comorbidity)    Tobacco abuse     Past Surgical History:  Procedure Laterality Date   ARTHRODESIS,ANTERIOR INTERBODY TECHNIQUE,INCLUDE MINIMAL DISKECTOMY TO PREPARE INTERSPACE; LUMBAR  01/29/2022   FINGER SURGERY Left    ring finger   KNEE SURGERY Right     Family History  Problem Relation Age of Onset   Arthritis Mother    Hyperlipidemia Mother    Hypertension Mother    Mental illness Mother    Thyroid disease Mother     Alcohol abuse Father    Drug abuse Father     Social History   Social History Narrative   Not on file     Review of Systems General: Denies fevers, chills, weight loss CV: Denies chest pain, shortness of breath, palpitations Skin: Patient has a mass on the forehead but denies any pain or history of infection.  Physical Exam    08/07/2023   10:44 AM 07/10/2023   10:49 AM 04/16/2023    9:53 AM  Vitals with BMI  Height 6\' 0"     Weight 305 lbs 306 lbs 10 oz   BMI 41.36    Systolic 153 137 191  Diastolic 80 71 80  Pulse 63 75 65    General:  No acute distress,  Alert and oriented, Non-Toxic, Normal speech and affect Integument: Patient has a mass that is approximately 3 to 4 cm across.  It is soft and nontender to palpation.  Does not appear to be fixed to any underlying structures.   Assessment/Plan Soft tissue mass: This is most likely a lipoma.  Due to its size and location I would prefer to do the resection in the operating room for better lites and better hemostasis.  The patient understands this and is in agreement.  We discussed the  procedure including the fact that he will have sutures which will need to be removed approximately a week postoperatively.  The patient does smoke and he understands that smoking in addition to all the other health risks will decrease the quality of the scar and increase his wound healing complications.  He will have a scar.  There is a possibility of recurrence.  All questions were answered to his satisfaction.  Photographs were obtained today with his consent.  Will schedule him for surgical resection at his request.  Santiago Glad 08/07/2023, 12:04 PM

## 2023-08-11 DIAGNOSIS — Z419 Encounter for procedure for purposes other than remedying health state, unspecified: Secondary | ICD-10-CM | POA: Diagnosis not present

## 2023-08-12 ENCOUNTER — Telehealth (INDEPENDENT_AMBULATORY_CARE_PROVIDER_SITE_OTHER): Payer: Medicaid Other | Admitting: Family Medicine

## 2023-08-12 ENCOUNTER — Encounter: Payer: Self-pay | Admitting: Family Medicine

## 2023-08-12 VITALS — Wt 303.0 lb

## 2023-08-12 DIAGNOSIS — Z720 Tobacco use: Secondary | ICD-10-CM

## 2023-08-12 DIAGNOSIS — F17210 Nicotine dependence, cigarettes, uncomplicated: Secondary | ICD-10-CM | POA: Diagnosis not present

## 2023-08-12 NOTE — Progress Notes (Signed)
Wt (!) 303 lb (137.4 kg)   BMI 41.09 kg/m    Subjective:    Patient ID: Fred Tran, male    DOB: 1966-02-14, 57 y.o.   MRN: [id number]  HPI: [name] is a 57 y.o. male  Chief Complaint  Patient presents with   Nicotine Dependence        SMOKING CESSATION- 2nd week on his chantix Smoking Status: current every day smoker Smoking Amount: about 1ppd Smoking Onset: 57 yo Smoking Quit Date: not set Smoking triggers: stress, Cindy Type of tobacco use: cigarettes Children in the house: yes Other household members who smoke: yes Treatments attempted: wellbutrin Pneumovax: UTD  Relevant past medical, surgical, family and social history reviewed and updated as indicated. Interim medical history since our last visit reviewed. Allergies and medications reviewed and updated.  Review of Systems  Constitutional: Negative.   Respiratory: Negative.    Cardiovascular: Negative.   Musculoskeletal: Negative.   Neurological: Negative.   Psychiatric/Behavioral: Negative.      Per HPI unless specifically indicated above     Objective:    Wt (!) 303 lb (137.4 kg)   BMI 41.09 kg/m   Wt Readings from Last 3 Encounters:  08/12/23 (!) 303 lb (137.4 kg)  08/07/23 (!) 305 lb (138.3 kg)  07/10/23 (!) 306 lb 9.6 oz (139.1 kg)    Physical Exam Vitals and nursing note reviewed.  Constitutional:      General: He is not in acute distress.    Appearance: Normal appearance. He is obese. He is not ill-appearing, toxic-appearing or diaphoretic.  HENT:     Head: Normocephalic and atraumatic.     Right Ear: External ear normal.     Left Ear: External ear normal.     Nose: Nose normal.     Mouth/Throat:     Mouth: Mucous membranes are moist.     Pharynx: Oropharynx is clear.  Eyes:     General: No scleral icterus.       Right eye: No discharge.        Left eye: No discharge.     Conjunctiva/sclera: Conjunctivae normal.     Pupils: Pupils are equal, round, and reactive to  light.  Pulmonary:     Effort: Pulmonary effort is normal. No respiratory distress.     Comments: Speaking in full sentences Musculoskeletal:        General: Normal range of motion.     Cervical back: Normal range of motion.  Skin:    Coloration: Skin is not jaundiced or pale.     Findings: No bruising, erythema, lesion or rash.  Neurological:     Mental Status: He is alert and oriented to person, place, and time. Mental status is at baseline.  Psychiatric:        Mood and Affect: Mood normal.        Behavior: Behavior normal.        Thought Content: Thought content normal.        Judgment: Judgment normal.     Results for orders placed or performed in visit on 04/16/23  Comprehensive metabolic panel  Result Value Ref Range   Glucose 132 (H) 70 - 99 mg/dL   BUN 14 6 - 24 mg/dL   Creatinine, Ser 9.56 0.76 - 1.27 mg/dL   eGFR 213 >08 MV/HQI/6.96   BUN/Creatinine Ratio 16 9 - 20   Sodium 142 134 - 144 mmol/L   Potassium 4.4 3.5 - 5.2 mmol/L  Chloride 104 96 - 106 mmol/L   CO2 24 20 - 29 mmol/L   Calcium 9.4 8.7 - 10.2 mg/dL   Total Protein 6.9 6.0 - 8.5 g/dL   Albumin 4.5 3.8 - 4.9 g/dL   Globulin, Total 2.4 1.5 - 4.5 g/dL   Bilirubin Total 0.3 0.0 - 1.2 mg/dL   Alkaline Phosphatase 92 44 - 121 IU/L   AST 19 0 - 40 IU/L   ALT 19 0 - 44 IU/L  CBC with Differential/Platelet  Result Value Ref Range   WBC 12.6 (H) 3.4 - 10.8 x10E3/uL   RBC 5.14 4.14 - 5.80 x10E6/uL   Hemoglobin 16.0 13.0 - 17.7 g/dL   Hematocrit 56.2 13.0 - 51.0 %   MCV 92 79 - 97 fL   MCH 31.1 26.6 - 33.0 pg   MCHC 33.9 31.5 - 35.7 g/dL   RDW 86.5 78.4 - 69.6 %   Platelets 284 150 - 450 x10E3/uL   Neutrophils 63 Not Estab. %   Lymphs 25 Not Estab. %   Monocytes 8 Not Estab. %   Eos 3 Not Estab. %   Basos 0 Not Estab. %   Neutrophils Absolute 7.9 (H) 1.4 - 7.0 x10E3/uL   Lymphocytes Absolute 3.2 (H) 0.7 - 3.1 x10E3/uL   Monocytes Absolute 1.1 (H) 0.1 - 0.9 x10E3/uL   EOS (ABSOLUTE) 0.3 0.0 - 0.4  x10E3/uL   Basophils Absolute 0.0 0.0 - 0.2 x10E3/uL   Immature Granulocytes 1 Not Estab. %   Immature Grans (Abs) 0.1 0.0 - 0.1 x10E3/uL  Lipid Panel w/o Chol/HDL Ratio  Result Value Ref Range   Cholesterol, Total 216 (H) 100 - 199 mg/dL   Triglycerides 295 0 - 149 mg/dL   HDL 51 >28 mg/dL   VLDL Cholesterol Cal 26 5 - 40 mg/dL   LDL Chol Calc (NIH) 413 (H) 0 - 99 mg/dL  PSA  Result Value Ref Range   Prostate Specific Ag, Serum 0.5 0.0 - 4.0 ng/mL  TSH  Result Value Ref Range   TSH 2.880 0.450 - 4.500 uIU/mL  Urinalysis, Routine w reflex microscopic  Result Value Ref Range   Specific Gravity, UA 1.015 1.005 - 1.030   pH, UA 7.0 5.0 - 7.5   Color, UA Yellow Yellow   Appearance Ur Clear Clear   Leukocytes,UA Negative Negative   Protein,UA Negative Negative/Trace   Glucose, UA Negative Negative   Ketones, UA Negative Negative   RBC, UA Negative Negative   Bilirubin, UA Negative Negative   Urobilinogen, Ur 0.2 0.2 - 1.0 mg/dL   Nitrite, UA Negative Negative   Microscopic Examination Comment       Assessment & Plan:   Problem List Items Addressed This Visit       Other   Tobacco abuse - Primary    Doing great. Has cut down from 1.5ppd to 1 ppd. Tolerating the chantix well. Continue chantix. Call with any concerns. Recheck 6 weeks. Call with any concerns.         Follow up plan: Return in about 6 weeks (around 09/23/2023).   This visit was completed via video visit through MyChart due to the restrictions of the COVID-19 pandemic. All issues as above were discussed and addressed. Physical exam was done as above through visual confirmation on video through MyChart. If it was felt that the patient should be evaluated in the office, they were directed there. The patient verbally consented to this visit. Location of the patient: home Location of the  provider: work Those involved with this call:  Provider: Olevia Perches, DO CMA: Malen Gauze, CMA Front Desk/Registration:   Servando Snare   Time spent on call:  15 minutes with patient face to face via video conference. More than 50% of this time was spent in counseling and coordination of care. 23 minutes total spent in review of patient's record and preparation of their chart.

## 2023-08-12 NOTE — Assessment & Plan Note (Signed)
Doing great. Has cut down from 1.5ppd to 1 ppd. Tolerating the chantix well. Continue chantix. Call with any concerns. Recheck 6 weeks. Call with any concerns.

## 2023-08-13 NOTE — Progress Notes (Signed)
Appointment has been made

## 2023-08-19 ENCOUNTER — Other Ambulatory Visit: Payer: Self-pay

## 2023-08-19 ENCOUNTER — Ambulatory Visit: Payer: Medicaid Other | Admitting: Surgical

## 2023-08-19 ENCOUNTER — Encounter (HOSPITAL_BASED_OUTPATIENT_CLINIC_OR_DEPARTMENT_OTHER): Payer: Self-pay | Admitting: Plastic Surgery

## 2023-08-19 ENCOUNTER — Encounter: Payer: Self-pay | Admitting: Surgical

## 2023-08-19 VITALS — BP 155/77 | HR 88

## 2023-08-19 DIAGNOSIS — D489 Neoplasm of uncertain behavior, unspecified: Secondary | ICD-10-CM

## 2023-08-19 NOTE — Progress Notes (Signed)
Patient ID: Fred Tran, male    DOB: 1966/07/31, 57 y.o.   MRN: [id number]  Chief Complaint  Patient presents with   Pre-op Exam      ICD-10-CM   1. Neoplasm, uncertain whether benign or malignant  D48.9       History of Present Illness: Fred Tran is a 57 y.o.  male  with a history of mass to the forehead.  He presents for preoperative evaluation for upcoming procedure, excision of soft tissue mass of the forehead, scheduled for 08/27/23 with Dr. Ladona Ridgel.  The patient has not had problems with anesthesia. No history of DVT/PE.  No family history of DVT/PE.  No family or personal history of bleeding or clotting disorders.  Patient is not currently taking any blood thinners.  No history of CVA/MI.   Summary of Previous Visit: Patient was seen for initial consult on 08/07/2023 by Dr. Ladona Ridgel.  At this visit, patient reported he had a mass on his forehead that had been there for approximately 8 years and had grown slowly over time.  Patient was requesting the mass to be removed.  Mass was most likely found to be a lipoma.  The patient was noted to be a smoker and patient understands that smoking in addition to all the other health risks he has will decrease the quality of the scar and increase his wound healing complications.  Job: disabled  PMH Significant for: forehead mass, hx of chronic back pain on chronic pain management, 35-pack-year smoker.  Patient reports he has been feeling well lately, no recent changes to his health.  He reports he has underwent back surgery before without any issues with anesthesia.  He reports he is on chronic pain medications for his back pain.  He reports he feels prepared for surgery and does not have any specific questions.   Past Medical History: Allergies: No Known Allergies  Current Medications:  Current Outpatient Medications:    baclofen (LIORESAL) 20 MG tablet, Take 20 mg by mouth 1 day or 1 dose., Disp: , Rfl:    DULoxetine  (CYMBALTA) 30 MG capsule, Take by mouth., Disp: , Rfl:    fexofenadine (ALLEGRA ALLERGY) 180 MG tablet, Take 1 tablet (180 mg total) by mouth daily., Disp: 10 tablet, Rfl: 1   meloxicam (MOBIC) 15 MG tablet, Take 1 tablet (15 mg total) by mouth daily., Disp: , Rfl:    nicotine (NICODERM CQ - DOSED IN MG/24 HOURS) 21 mg/24hr patch, Place onto the skin., Disp: , Rfl:    nicotine polacrilex (COMMIT) 4 MG lozenge, SMARTSIG:1 Lozenge(s) By Mouth Every 2 Hours PRN, Disp: , Rfl:    pregabalin (LYRICA) 100 MG capsule, Take by mouth., Disp: , Rfl:    Varenicline Tartrate, Starter, (CHANTIX STARTING MONTH PAK) 0.5 MG X 11 & 1 MG X 42 TBPK, Take as directed, Disp: 53 each, Rfl: 0  Past Medical Problems: Past Medical History:  Diagnosis Date   Arthritis    Hyperlipidemia    Obesity (BMI 35.0-39.9 without comorbidity)    Tobacco abuse     Past Surgical History: Past Surgical History:  Procedure Laterality Date   ARTHRODESIS,ANTERIOR INTERBODY TECHNIQUE,INCLUDE MINIMAL DISKECTOMY TO PREPARE INTERSPACE; LUMBAR  01/29/2022   FINGER SURGERY Left    ring finger   KNEE SURGERY Right     Social History: Social History   Socioeconomic History   Marital status: Married    Spouse name: Not on file   Number of children: Not  on file   Years of education: Not on file   Highest education level: 12th grade  Occupational History   Not on file  Tobacco Use   Smoking status: Every Day    Current packs/day: 1.00    Average packs/day: 1 pack/day for 35.0 years (35.0 ttl pk-yrs)    Types: Cigarettes   Smokeless tobacco: Never  Vaping Use   Vaping status: Never Used  Substance and Sexual Activity   Alcohol use: No   Drug use: No   Sexual activity: Yes  Other Topics Concern   Not on file  Social History Narrative   Not on file   Social Determinants of Health   Financial Resource Strain: Patient Declined (01/30/2023)   Overall Financial Resource Strain (CARDIA)    Difficulty of Paying Living  Expenses: Patient declined  Food Insecurity: Patient Declined (01/30/2023)   Hunger Vital Sign    Worried About Running Out of Food in the Last Year: Patient declined    Ran Out of Food in the Last Year: Patient declined  Transportation Needs: No Transportation Needs (07/08/2023)   PRAPARE - Administrator, Civil Service (Medical): No    Lack of Transportation (Non-Medical): No  Physical Activity: Unknown (07/08/2023)   Exercise Vital Sign    Days of Exercise per Week: 0 days    Minutes of Exercise per Session: Not on file  Stress: No Stress Concern Present (07/08/2023)   [employer] of Occupational Health - Occupational Stress Questionnaire    Feeling of Stress : Only a little  Social Connections: Moderately Isolated (07/08/2023)   Social Connection and Isolation Panel [NHANES]    Frequency of Communication with Friends and Family: More than three times a week    Frequency of Social Gatherings with Friends and Family: Once a week    Attends Religious Services: Never    Database administrator or Organizations: No    Attends Engineer, structural: Not on file    Marital Status: Married  Catering manager Violence: Not on file    Family History: Family History  Problem Relation Age of Onset   Arthritis Mother    Hyperlipidemia Mother    Hypertension Mother    Mental illness Mother    Thyroid disease Mother    Alcohol abuse Father    Drug abuse Father     Review of Systems: Review of Systems  Constitutional: Negative.   Respiratory: Negative.    Cardiovascular: Negative.   Gastrointestinal: Negative.   Neurological: Negative.     Physical Exam: Vital Signs BP (!) 155/77 (BP Location: Left Arm, Patient Position: Sitting, Cuff Size: Large)   Pulse 88   SpO2 96%   Physical Exam Constitutional:      General: Not in acute distress.    Appearance: Normal appearance. Not ill-appearing.  HENT:     Head: Normocephalic and atraumatic.  Eyes:      Pupils: Pupils are equal, round Neck:     Musculoskeletal: Normal range of motion.  Cardiovascular:     Rate and Rhythm: Normal rate    Pulses: Normal pulses.  Pulmonary:     Effort: Pulmonary effort is normal. No respiratory distress.  Abdominal:     General: Abdomen is flat. There is no distension.  Musculoskeletal: Normal range of motion.  Skin:    General: Skin is warm and dry.     Findings: No erythema or rash.  Neurological:     General: No focal deficit present.  Mental Status: Alert and oriented to person, place, and time. Mental status is at baseline.     Motor: No weakness.  Psychiatric:        Mood and Affect: Mood normal.        Behavior: Behavior normal.    Assessment/Plan: The patient is scheduled for excision of forehead mass with Dr. Ladona Ridgel.  Risks, benefits, and alternatives of procedure discussed, questions answered and consent obtained.    Smoking Status: Patient is a smoker; Counseling Given?  Discussed increased risk of postoperative complications  Caprini Score: 5; Risk Factors include: Age, BMI > 25, and length of planned surgery. Recommendation for mechanical prophylaxis. Encourage early ambulation.   Pictures obtained: @consult   Post-op Rx sent to pharmacy:  No prescriptions, patient will use OTC medications for pain control.  Patient was provided with the General Surgical Risk consent document and Pain Medication Agreement prior to their appointment.  They had adequate time to read through the risk consent documents and Pain Medication Agreement. We also discussed them in person together during this preop appointment. All of their questions were answered to their satisfaction.  Recommended calling if they have any further questions.  Risk consent form and Pain Medication Agreement to be scanned into patient's chart.  The risks that can be encountered with and after excision of a mass were discussed and include the following but not limited to  these: bleeding, infection, delayed healing, anesthesia risks, skin sensation changes, injury to structures including nerves, blood vessels, and muscles which may be temporary or permanent, allergies to tape, suture materials and glues, blood products, topical preparations or injected agents, skin contour irregularities, skin discoloration and swelling, deep vein thrombosis, cardiac and pulmonary complications, pain, which may persist, persistent pain, recurrence of the lesion, poor healing of the incision, possible need for revisional surgery or staged procedures.    Electronically signed by: Fred Balo Wynonia Medero, PA-C 08/19/2023 11:05 AM

## 2023-08-19 NOTE — Progress Notes (Signed)
   08/19/23 1612  Pre-op Phone Call  Surgery Date Verified 08/27/23  Arrival Time Verified 1300  Surgery Location Verified [facility]  Medical History Reviewed Yes  Is the patient taking a GLP-1 receptor agonist? No  Does the patient have diabetes? No diagnosis of diabetes  Do you have a history of heart problems? No  Does patient have other implanted devices? No  Patient Teaching Enhanced Recovery;Pre / Post Procedure  Patient educated about smoking cessation 24 hours prior to surgery. N/A Non-Smoker  Patient verbalizes understanding of bowel prep? N/A  THA/TKA patients only:  By your surgery date, will you have been taking narcotics for 90 days or greater? No  Med Rec Completed Yes  Take the Following Meds the Morning of Surgery cymbalta, allegra, lyrica  Recent  Lab Work, EKG, CXR? No  NPO (Including gum & candy) After midnight  Allowed clear liquids Gatorade  (diabetics please choose diet or no sugar options);Black Coffee Only (no creamer, milk or cream including half and half);Water  Patient instructed to stop clear liquids including Carb loading drink at: 1130  Stop Solids, Milk, Candy, and Gum STARTING AT MIDNIGHT  Did patient view EMMI videos? No  Responsible adult to drive and be with you for 24 hours? Yes  Name & Phone Number for Ride/Caregiver wife cindy  No Jewelry, money, nail polish or make-up.  No lotions, powders, perfumes. No shaving  48 hrs. prior to surgery. Yes  Contacts, Dentures & Glasses Will Have to be Removed Before OR. Yes  Please bring your ID and Insurance Card the morning of your surgery. (Surgery Centers Only) Yes  Bring any papers or x-rays with you that your surgeon gave you. Yes  Instructed to contact the location of procedure/ provider if they or anyone in their household develops symptoms or tests positive for COVID-19, has close contact with someone who tests positive for COVID, or has known exposure to any contagious illness. Yes  Call this number the  morning of surgery  with any problems that may cancel your surgery. [phone]  Covid-19 Assessment  Have you had a positive COVID-19 test within the previous 90 days? No  COVID Testing Guidance Proceed with the additional questions.  Patient's surgery required a COVID-19 test (cardiothoracic, complex ENT, and bronchoscopies/ EBUS) No  Have you been unmasked and in close contact with anyone with COVID-19 or COVID-19 symptoms within the past 10 days? No  Do you or anyone in your household currently have any COVID-19 symptoms? No

## 2023-08-20 DIAGNOSIS — M5416 Radiculopathy, lumbar region: Secondary | ICD-10-CM | POA: Diagnosis not present

## 2023-08-27 ENCOUNTER — Ambulatory Visit (HOSPITAL_BASED_OUTPATIENT_CLINIC_OR_DEPARTMENT_OTHER): Payer: Medicaid Other | Admitting: Anesthesiology

## 2023-08-27 ENCOUNTER — Encounter (HOSPITAL_BASED_OUTPATIENT_CLINIC_OR_DEPARTMENT_OTHER)
Admission: RE | Disposition: A | Discharge status: Home / Self Care | Payer: Self-pay | Source: Home / Self Care | Attending: Plastic Surgery

## 2023-08-27 ENCOUNTER — Other Ambulatory Visit: Payer: Self-pay

## 2023-08-27 ENCOUNTER — Encounter (HOSPITAL_BASED_OUTPATIENT_CLINIC_OR_DEPARTMENT_OTHER): Payer: Self-pay | Admitting: Plastic Surgery

## 2023-08-27 ENCOUNTER — Ambulatory Visit (HOSPITAL_BASED_OUTPATIENT_CLINIC_OR_DEPARTMENT_OTHER)
Admission: RE | Admit: 2023-08-27 | Discharge: 2023-08-27 | Disposition: A | Discharge status: Home / Self Care | Payer: Medicaid Other | Attending: Plastic Surgery | Admitting: Plastic Surgery

## 2023-08-27 SURGERY — EXCISION MASS
Anesthesia: General

## 2023-08-27 MED ORDER — ROCURONIUM BROMIDE 10 MG/ML (PF) SYRINGE
PREFILLED_SYRINGE | INTRAVENOUS | Status: AC
  Filled 2023-08-27: qty 10

## 2023-08-27 MED ORDER — ACETAMINOPHEN 500 MG PO TABS
1000.0000 mg | ORAL_TABLET | Freq: Once | ORAL | Status: AC
  Administered 2023-08-27: 1000 mg via ORAL

## 2023-08-27 MED ORDER — ONDANSETRON HCL 4 MG/2ML IJ SOLN
INTRAMUSCULAR | Status: AC
  Filled 2023-08-27: qty 2

## 2023-08-27 MED ORDER — LACTATED RINGERS IV SOLN
INTRAVENOUS | Status: DC
Start: 2023-08-27 — End: 2023-08-27

## 2023-08-27 MED ORDER — CHLORHEXIDINE GLUCONATE CLOTH 2 % EX PADS: 6.0000 | MEDICATED_PAD | Freq: Once | CUTANEOUS | Status: DC

## 2023-08-27 MED ORDER — FENTANYL CITRATE (PF) 100 MCG/2ML IJ SOLN
INTRAMUSCULAR | Status: AC
  Filled 2023-08-27: qty 2

## 2023-08-27 MED ORDER — LIDOCAINE 2% (20 MG/ML) 5 ML SYRINGE
INTRAMUSCULAR | Status: AC
Start: 2023-08-27 — End: ?
  Filled 2023-08-27: qty 5

## 2023-08-27 MED ORDER — DEXAMETHASONE SODIUM PHOSPHATE 10 MG/ML IJ SOLN
INTRAMUSCULAR | Status: AC
  Filled 2023-08-27: qty 1

## 2023-08-27 MED ORDER — ACETAMINOPHEN 500 MG PO TABS
ORAL_TABLET | ORAL | Status: AC
Start: 2023-08-27 — End: ?
  Filled 2023-08-27: qty 2

## 2023-08-27 MED ORDER — MIDAZOLAM HCL 2 MG/2ML IJ SOLN
INTRAMUSCULAR | Status: AC
  Filled 2023-08-27: qty 2

## 2023-08-27 MED ORDER — PROPOFOL 10 MG/ML IV BOLUS
INTRAVENOUS | Status: AC
  Filled 2023-08-27: qty 20

## 2023-08-27 NOTE — Progress Notes (Signed)
Surgeon is running 2+ hours behind and pt requested to have his IV taken out and he will reschedule this procedure. OR and MD made aware.

## 2023-08-27 NOTE — Anesthesia Preprocedure Evaluation (Addendum)
Anesthesia Evaluation  Patient identified by MRN, date of birth, ID band Patient awake    Reviewed: Allergy & Precautions, NPO status , Patient's Chart, lab work & pertinent test results  History of Anesthesia Complications Negative for: history of anesthetic complications  Airway Mallampati: II  TM Distance: >3 FB Neck ROM: Full    Dental  (+) Missing, Loose, Chipped, Poor Dentition,    Pulmonary Current Smoker   Pulmonary exam normal        Cardiovascular negative cardio ROS Normal cardiovascular exam     Neuro/Psych    GI/Hepatic negative GI ROS, Neg liver ROS,,,  Endo/Other    Class 3 obesity  Renal/GU negative Renal ROS  negative genitourinary   Musculoskeletal  (+) Arthritis ,    Abdominal   Peds  Hematology negative hematology ROS (+)   Anesthesia Other Findings Day of surgery medications reviewed with patient.  Reproductive/Obstetrics                              Anesthesia Physical Anesthesia Plan  ASA: 3  Anesthesia Plan: General   Post-op Pain Management: Tylenol PO (pre-op)*   Induction: Intravenous  PONV Risk Score and Plan: 2 and Treatment may vary due to age or medical condition, Ondansetron, Dexamethasone and Midazolam  Airway Management Planned: Oral ETT  Additional Equipment: None  Intra-op Plan:   Post-operative Plan: Extubation in OR  Informed Consent: I have reviewed the patients History and Physical, chart, labs and discussed the procedure including the risks, benefits and alternatives for the proposed anesthesia with the patient or authorized representative who has indicated his/her understanding and acceptance.     Dental advisory given  Plan Discussed with: CRNA  Anesthesia Plan Comments:         Anesthesia Quick Evaluation

## 2023-09-05 ENCOUNTER — Encounter: Payer: Medicaid Other | Admitting: Plastic Surgery

## 2023-09-11 DIAGNOSIS — Z419 Encounter for procedure for purposes other than remedying health state, unspecified: Secondary | ICD-10-CM | POA: Diagnosis not present

## 2023-09-13 ENCOUNTER — Encounter (HOSPITAL_BASED_OUTPATIENT_CLINIC_OR_DEPARTMENT_OTHER): Payer: Self-pay | Admitting: Anesthesiology

## 2023-09-18 ENCOUNTER — Encounter: Payer: Medicaid Other | Admitting: Student

## 2023-09-24 ENCOUNTER — Encounter: Payer: Self-pay | Admitting: Family Medicine

## 2023-09-24 ENCOUNTER — Ambulatory Visit (INDEPENDENT_AMBULATORY_CARE_PROVIDER_SITE_OTHER): Payer: Medicaid Other | Admitting: Family Medicine

## 2023-09-24 DIAGNOSIS — Z23 Encounter for immunization: Secondary | ICD-10-CM | POA: Diagnosis not present

## 2023-09-24 DIAGNOSIS — R030 Elevated blood-pressure reading, without diagnosis of hypertension: Secondary | ICD-10-CM

## 2023-09-24 DIAGNOSIS — Z720 Tobacco use: Secondary | ICD-10-CM | POA: Diagnosis not present

## 2023-09-24 MED ORDER — VARENICLINE TARTRATE 1 MG PO TABS: 1.0000 mg | ORAL_TABLET | Freq: Two times a day (BID) | ORAL | 3 refills | Status: DC

## 2023-09-24 MED ORDER — WEGOVY 0.25 MG/0.5ML ~~LOC~~ SOAJ: 0.2500 mg | SUBCUTANEOUS | 1 refills | Status: DC

## 2023-09-24 NOTE — Progress Notes (Signed)
 BP (!) 162/82   Pulse 71   Temp 97.6 F (36.4 C) (Oral)   Wt (!) 305 lb 12.8 oz (138.7 kg)   SpO2 96%   BMI 41.47 kg/m    Subjective:    Patient ID: Fred Tran, male    DOB: 11/13/1965, 58 y.o.   MRN: [id number]  HPI: Fred Tran is a 58 y.o. male  Chief Complaint  Patient presents with   Nicotine Dependence   SMOKING CESSATION Smoking Status: current every day smoker Smoking Amount: less than a pack a day Smoking Onset: 58 yo Smoking Quit Date: 10/25/23 Smoking triggers: stress, first thing in the AM Type of tobacco use: cigarettes Children in the house: yes Other household members who smoke: yes Treatments attempted: wellbutrin, chantix  Pneumovax: UTD  OBESITY Duration: chronic Previous attempts at weight loss: yes Complications of obesity: HTN, low back pain, HLD, depression Peak weight: current (305#) Weight loss goal:  200# Weight loss to date: none Requesting obesity pharmacotherapy: no Current weight loss supplements/medications: no Previous weight loss supplements/meds: no  Clinical coverage for weight loss GLP's   Medication being dispensed is Wegovy  3 mL/28 day. Titration doses are 2 mL/28 days.   [x]  Product being prescribed is FDA approved for the indication, age, weight (if applicable) and not does not exceed dosing limits per the Prescribing Information per the clinical conditions for use.  [x]  Patient's baseline weight measured within the last 45 days as required by provider before dispensing.  [x]  Patient is new to therapy and One of the following:  [x]  The beneficiary is 59 years of age or over and has ONE of the following:  [x]  A BMI greater than or equal to 30 kg/m2  [x]  A BMI greater than or equal to 27 kg/m2 with at least one weight-related comorbidity/risk factor/complication (i.e. hypertension, type 2 diabetes, obstructive sleep apnea, cardiovascular disease, dyslipidemia)  [x]  If patient has one weight-related  comorbidity/risk factor/complication (i.e. hypertension, type 2 diabetes, obstructive sleep apnea, cardiovascular disease, dyslipidemia), please list  Patient suffers from weight-related comorbidity/risk factor/complication HTN, low back pain, HLD, depression  [x]  The beneficiary is currently on and will continue lifestyle modification including structured nutrition and physical activity, unless physical activity is not clinically appropriate at the time GLP1 therapy commences AND  [x]  The beneficiary will NOT be using the requested agent in combination with another GLP-1 receptor agonist agent AND  [x]  The beneficiary does NOT have any FDA-labeled contraindications to the requested agent, including pregnancy, lactation, history of medullary thyroid cancer or multiple endocrine neoplasia type II.  Estimated body mass index is 41.47 kg/m as calculated from the following:   Height as of 08/27/23: 6' (1.829 m).   Weight as of this encounter: 305 lb 12.8 oz (138.7 kg).     Relevant past medical, surgical, family and social history reviewed and updated as indicated. Interim medical history since our last visit reviewed. Allergies and medications reviewed and updated.  Review of Systems  Constitutional: Negative.   Respiratory: Negative.    Cardiovascular: Negative.   Musculoskeletal:  Positive for back pain. Negative for arthralgias, gait problem, joint swelling, myalgias, neck pain and neck stiffness.  Psychiatric/Behavioral: Negative.      Per HPI unless specifically indicated above     Objective:    BP (!) 162/82   Pulse 71   Temp 97.6 F (36.4 C) (Oral)   Wt (!) 305 lb 12.8 oz (138.7 kg)   SpO2 96%   BMI 41.47 kg/m  Wt Readings from Last 3 Encounters:  09/24/23 (!) 305 lb 12.8 oz (138.7 kg)  08/27/23 298 lb 4.5 oz (135.3 kg)  08/12/23 (!) 303 lb (137.4 kg)    Physical Exam Vitals and nursing note reviewed.  Constitutional:      General: He is not in acute distress.     Appearance: Normal appearance. He is obese. He is not ill-appearing, toxic-appearing or diaphoretic.  HENT:     Head: Normocephalic and atraumatic.     Right Ear: External ear normal.     Left Ear: External ear normal.     Nose: Nose normal.     Mouth/Throat:     Mouth: Mucous membranes are moist.     Pharynx: Oropharynx is clear.  Eyes:     General: No scleral icterus.       Right eye: No discharge.        Left eye: No discharge.     Extraocular Movements: Extraocular movements intact.     Conjunctiva/sclera: Conjunctivae normal.     Pupils: Pupils are equal, round, and reactive to light.  Cardiovascular:     Rate and Rhythm: Normal rate and regular rhythm.     Pulses: Normal pulses.     Heart sounds: Normal heart sounds. No murmur heard.    No friction rub. No gallop.  Pulmonary:     Effort: Pulmonary effort is normal. No respiratory distress.     Breath sounds: Normal breath sounds. No stridor. No wheezing, rhonchi or rales.  Chest:     Chest wall: No tenderness.  Musculoskeletal:        General: Normal range of motion.     Cervical back: Normal range of motion and neck supple.  Skin:    General: Skin is warm and dry.     Capillary Refill: Capillary refill takes less than 2 seconds.     Coloration: Skin is not jaundiced or pale.     Findings: No bruising, erythema, lesion or rash.  Neurological:     General: No focal deficit present.     Mental Status: He is alert and oriented to person, place, and time. Mental status is at baseline.  Psychiatric:        Mood and Affect: Mood normal.        Behavior: Behavior normal.        Thought Content: Thought content normal.        Judgment: Judgment normal.     Results for orders placed or performed in visit on 04/16/23  Urinalysis, Routine w reflex microscopic   Collection Time: 04/16/23  9:56 AM  Result Value Ref Range   Specific Gravity, UA 1.015 1.005 - 1.030   pH, UA 7.0 5.0 - 7.5   Color, UA Yellow Yellow    Appearance Ur Clear Clear   Leukocytes,UA Negative Negative   Protein,UA Negative Negative/Trace   Glucose, UA Negative Negative   Ketones, UA Negative Negative   RBC, UA Negative Negative   Bilirubin, UA Negative Negative   Urobilinogen, Ur 0.2 0.2 - 1.0 mg/dL   Nitrite, UA Negative Negative   Microscopic Examination Comment   Comprehensive metabolic panel   Collection Time: 04/16/23  9:57 AM  Result Value Ref Range   Glucose 132 (H) 70 - 99 mg/dL   BUN 14 6 - 24 mg/dL   Creatinine, Ser 9.13 0.76 - 1.27 mg/dL   eGFR 899 >40 fO/fpw/8.26   BUN/Creatinine Ratio 16 9 - 20   Sodium 142 134 -  144 mmol/L   Potassium 4.4 3.5 - 5.2 mmol/L   Chloride 104 96 - 106 mmol/L   CO2 24 20 - 29 mmol/L   Calcium 9.4 8.7 - 10.2 mg/dL   Total Protein 6.9 6.0 - 8.5 g/dL   Albumin 4.5 3.8 - 4.9 g/dL   Globulin, Total 2.4 1.5 - 4.5 g/dL   Bilirubin Total 0.3 0.0 - 1.2 mg/dL   Alkaline Phosphatase 92 44 - 121 IU/L   AST 19 0 - 40 IU/L   ALT 19 0 - 44 IU/L  CBC with Differential/Platelet   Collection Time: 04/16/23  9:57 AM  Result Value Ref Range   WBC 12.6 (H) 3.4 - 10.8 x10E3/uL   RBC 5.14 4.14 - 5.80 x10E6/uL   Hemoglobin 16.0 13.0 - 17.7 g/dL   Hematocrit 52.7 62.4 - 51.0 %   MCV 92 79 - 97 fL   MCH 31.1 26.6 - 33.0 pg   MCHC 33.9 31.5 - 35.7 g/dL   RDW 87.1 88.3 - 84.5 %   Platelets 284 150 - 450 x10E3/uL   Neutrophils 63 Not Estab. %   Lymphs 25 Not Estab. %   Monocytes 8 Not Estab. %   Eos 3 Not Estab. %   Basos 0 Not Estab. %   Neutrophils Absolute 7.9 (H) 1.4 - 7.0 x10E3/uL   Lymphocytes Absolute 3.2 (H) 0.7 - 3.1 x10E3/uL   Monocytes Absolute 1.1 (H) 0.1 - 0.9 x10E3/uL   EOS (ABSOLUTE) 0.3 0.0 - 0.4 x10E3/uL   Basophils Absolute 0.0 0.0 - 0.2 x10E3/uL   Immature Granulocytes 1 Not Estab. %   Immature Grans (Abs) 0.1 0.0 - 0.1 x10E3/uL  Lipid Panel w/o Chol/HDL Ratio   Collection Time: 04/16/23  9:57 AM  Result Value Ref Range   Cholesterol, Total 216 (H) 100 - 199 mg/dL    Triglycerides 854 0 - 149 mg/dL   HDL 51 >60 mg/dL   VLDL Cholesterol Cal 26 5 - 40 mg/dL   LDL Chol Calc (NIH) 860 (H) 0 - 99 mg/dL  PSA   Collection Time: 04/16/23  9:57 AM  Result Value Ref Range   Prostate Specific Ag, Serum 0.5 0.0 - 4.0 ng/mL  TSH   Collection Time: 04/16/23  9:57 AM  Result Value Ref Range   TSH 2.880 0.450 - 4.500 uIU/mL      Assessment & Plan:   Problem List Items Addressed This Visit       Other   Tobacco abuse   Continues to cut down. Tolerating medicine well. Quit date set for 10/25/23. Refills given today. Needs lung cancer screening. Ordered today.      Relevant Orders   Ambulatory Referral Lung Cancer Screening [facility]   Obesity, morbid (HCC) - Primary   Would benefit from wegovy - will get him started and recheck in about 6-8 weeks at physical. Call with any concerns.       Relevant Medications   Semaglutide -Weight Management (WEGOVY ) 0.25 MG/0.5ML SOAJ   Other Visit Diagnoses       Elevated blood pressure reading       Will work on weight loss and recheck in April- may need to start medicine.     Need for vaccination for pneumococcus       Prevnar given today.   Relevant Orders   Pneumococcal conjugate vaccine 20-valent (Prevnar 20) (Completed)        Follow up plan: Return in about 11 weeks (around 12/10/2023) for physical.

## 2023-09-24 NOTE — Assessment & Plan Note (Signed)
 Would benefit from wegovy- will get him started and recheck in about 6-8 weeks at physical. Call with any concerns.

## 2023-09-24 NOTE — Assessment & Plan Note (Addendum)
 Continues to cut down. Tolerating medicine well. Quit date set for 10/25/23. Refills given today. Needs lung cancer screening. Ordered today.

## 2023-10-02 ENCOUNTER — Encounter: Payer: Medicaid Other | Admitting: Student

## 2023-10-10 ENCOUNTER — Telehealth: Payer: Self-pay

## 2023-10-10 NOTE — Telephone Encounter (Signed)
LVM per Dr. Lubertha Basque request. Patient cannot be rescheduled for surgery due to patient leaving AMA during pre-op the day of surgery. We definitely do apologize.

## 2023-10-12 DIAGNOSIS — Z419 Encounter for procedure for purposes other than remedying health state, unspecified: Secondary | ICD-10-CM | POA: Diagnosis not present

## 2023-11-05 DIAGNOSIS — M961 Postlaminectomy syndrome, not elsewhere classified: Secondary | ICD-10-CM | POA: Diagnosis not present

## 2023-11-05 DIAGNOSIS — M5416 Radiculopathy, lumbar region: Secondary | ICD-10-CM | POA: Diagnosis not present

## 2023-11-09 DIAGNOSIS — Z419 Encounter for procedure for purposes other than remedying health state, unspecified: Secondary | ICD-10-CM | POA: Diagnosis not present

## 2023-11-12 ENCOUNTER — Ambulatory Visit: Payer: Medicaid Other | Admitting: Family Medicine

## 2023-11-20 ENCOUNTER — Other Ambulatory Visit: Payer: Self-pay | Admitting: Family Medicine

## 2023-11-21 NOTE — Telephone Encounter (Signed)
 Requested medications are due for refill today.  yes  Requested medications are on the active medications list.  yes  Last refill. 09/24/2023 2 mL 1 rf  Future visit scheduled.   yes  Notes to clinic.  Labs are expired.     Requested Prescriptions  Pending Prescriptions Disp Refills   WEGOVY 0.25 MG/0.5ML SOAJ [Pharmacy Med Name: WEGOVY 0.25MG /0.5ML INJ ( 4 PENS)] 2 mL 1    Sig: ADMINISTER 0.25 MG UNDER THE SKIN 1 TIME A WEEK     Endocrinology:  Diabetes - GLP-1 Receptor Agonists - semaglutide Failed - 11/21/2023 10:13 AM      Failed - HBA1C in normal range and within 180 days    HB A1C (BAYER DCA - WAIVED)  Date Value Ref Range Status  07/05/2021 5.5 4.8 - 5.6 % Final    Comment:             Prediabetes: 5.7 - 6.4          Diabetes: >6.4          Glycemic control for adults with diabetes: <7.0          Passed - Cr in normal range and within 360 days    Creatinine, Ser  Date Value Ref Range Status  04/16/2023 0.86 0.76 - 1.27 mg/dL Final         Passed - Valid encounter within last 6 months    Recent Outpatient Visits           1 month ago Obesity, morbid (HCC)   [place] [facility] [place], Megan P, DO   3 months ago Tobacco abuse   [place] [facility] [place], [place], DO   4 months ago Tobacco abuse   [place] [facility] [place], Megan P, DO   7 months ago Routine general medical examination at a health care facility   [facility], Megan P, DO   9 months ago Other intervertebral disc degeneration, lumbar region   [facility] [place], [place], DO

## 2023-12-02 ENCOUNTER — Encounter: Payer: Self-pay | Admitting: Family Medicine

## 2023-12-02 ENCOUNTER — Ambulatory Visit: Admitting: Family Medicine

## 2023-12-02 DIAGNOSIS — D170 Benign lipomatous neoplasm of skin and subcutaneous tissue of head, face and neck: Secondary | ICD-10-CM

## 2023-12-02 MED ORDER — OMEPRAZOLE 20 MG PO CPDR
20.0000 mg | DELAYED_RELEASE_CAPSULE | Freq: Every day | ORAL | 3 refills | Status: DC
Start: 2023-12-02 — End: 2024-04-01

## 2023-12-02 MED ORDER — WEGOVY 0.5 MG/0.5ML ~~LOC~~ SOAJ: 0.5000 mg | SUBCUTANEOUS | 1 refills | Status: DC

## 2023-12-02 NOTE — Addendum Note (Signed)
 Addended by: Dorcas Carrow on: 12/02/2023 04:37 PM   Modules accepted: Orders

## 2023-12-02 NOTE — Assessment & Plan Note (Signed)
 Tolerating his wegovy well. Down 2% of his body weight in 2 weeks. Will continue 2 weeks of 0.25mg  and then go up to the 0.5mg - will recheck in 5 weeks. Call with any concerns.

## 2023-12-02 NOTE — Progress Notes (Signed)
 BP (!) 143/84   Pulse 78   Temp (!) 97.5 F (36.4 C) (Oral)   Wt 298 lb (135.2 kg)   BMI 40.42 kg/m    Subjective:    Patient ID: Fred Tran, male    DOB: 07-May-1966, 58 y.o.   MRN: [id number]  HPI: Fred Tran is a 58 y.o. male  Chief Complaint  Patient presents with   Obesity   OBESITY- tolerating medicine well. Had a bit of pain in his belly for a short period of time, but is tolerating his medicine well. Duration: chronic Previous attempts at weight loss: yes Complications of obesity:  HTN, low back pain, HLD, depression  Peak weight: 305# Weight loss goal: 200# Weight loss to date: 7# Requesting obesity pharmacotherapy: yes Current weight loss supplements/medications: yes Previous weight loss supplements/meds: no  Relevant past medical, surgical, family and social history reviewed and updated as indicated. Interim medical history since our last visit reviewed. Allergies and medications reviewed and updated.  Review of Systems  Constitutional: Negative.   Respiratory: Negative.    Cardiovascular: Negative.   Musculoskeletal: Negative.   Neurological: Negative.   Psychiatric/Behavioral: Negative.      Per HPI unless specifically indicated above     Objective:    BP (!) 143/84   Pulse 78   Temp (!) 97.5 F (36.4 C) (Oral)   Wt 298 lb (135.2 kg)   BMI 40.42 kg/m   Wt Readings from Last 3 Encounters:  12/02/23 298 lb (135.2 kg)  09/24/23 (!) 305 lb 12.8 oz (138.7 kg)  08/27/23 298 lb 4.5 oz (135.3 kg)    Physical Exam Vitals and nursing note reviewed.  Constitutional:      General: He is not in acute distress.    Appearance: Normal appearance. He is obese. He is not ill-appearing, toxic-appearing or diaphoretic.  HENT:     Head: Normocephalic and atraumatic.     Right Ear: External ear normal.     Left Ear: External ear normal.     Nose: Nose normal.     Mouth/Throat:     Mouth: Mucous membranes are moist.     Pharynx: Oropharynx  is clear.  Eyes:     General: No scleral icterus.       Right eye: No discharge.        Left eye: No discharge.     Extraocular Movements: Extraocular movements intact.     Conjunctiva/sclera: Conjunctivae normal.     Pupils: Pupils are equal, round, and reactive to light.  Cardiovascular:     Rate and Rhythm: Normal rate and regular rhythm.     Pulses: Normal pulses.     Heart sounds: Normal heart sounds. No murmur heard.    No friction rub. No gallop.  Pulmonary:     Effort: Pulmonary effort is normal. No respiratory distress.     Breath sounds: Normal breath sounds. No stridor. No wheezing, rhonchi or rales.  Chest:     Chest wall: No tenderness.  Musculoskeletal:        General: Normal range of motion.     Cervical back: Normal range of motion and neck supple.  Skin:    General: Skin is warm and dry.     Capillary Refill: Capillary refill takes less than 2 seconds.     Coloration: Skin is not jaundiced or pale.     Findings: No bruising, erythema, lesion or rash.  Neurological:     General: No focal deficit  present.     Mental Status: He is alert and oriented to person, place, and time. Mental status is at baseline.  Psychiatric:        Mood and Affect: Mood normal.        Behavior: Behavior normal.        Thought Content: Thought content normal.        Judgment: Judgment normal.     Results for orders placed or performed in visit on 04/16/23  Urinalysis, Routine w reflex microscopic   Collection Time: 04/16/23  9:56 AM  Result Value Ref Range   Specific Gravity, UA 1.015 1.005 - 1.030   pH, UA 7.0 5.0 - 7.5   Color, UA Yellow Yellow   Appearance Ur Clear Clear   Leukocytes,UA Negative Negative   Protein,UA Negative Negative/Trace   Glucose, UA Negative Negative   Ketones, UA Negative Negative   RBC, UA Negative Negative   Bilirubin, UA Negative Negative   Urobilinogen, Ur 0.2 0.2 - 1.0 mg/dL   Nitrite, UA Negative Negative   Microscopic Examination Comment    Comprehensive metabolic panel   Collection Time: 04/16/23  9:57 AM  Result Value Ref Range   Glucose 132 (H) 70 - 99 mg/dL   BUN 14 6 - 24 mg/dL   Creatinine, Ser 1.61 0.76 - 1.27 mg/dL   eGFR 096 >04 VW/UJW/1.19   BUN/Creatinine Ratio 16 9 - 20   Sodium 142 134 - 144 mmol/L   Potassium 4.4 3.5 - 5.2 mmol/L   Chloride 104 96 - 106 mmol/L   CO2 24 20 - 29 mmol/L   Calcium 9.4 8.7 - 10.2 mg/dL   Total Protein 6.9 6.0 - 8.5 g/dL   Albumin 4.5 3.8 - 4.9 g/dL   Globulin, Total 2.4 1.5 - 4.5 g/dL   Bilirubin Total 0.3 0.0 - 1.2 mg/dL   Alkaline Phosphatase 92 44 - 121 IU/L   AST 19 0 - 40 IU/L   ALT 19 0 - 44 IU/L  CBC with Differential/Platelet   Collection Time: 04/16/23  9:57 AM  Result Value Ref Range   WBC 12.6 (H) 3.4 - 10.8 x10E3/uL   RBC 5.14 4.14 - 5.80 x10E6/uL   Hemoglobin 16.0 13.0 - 17.7 g/dL   Hematocrit 14.7 82.9 - 51.0 %   MCV 92 79 - 97 fL   MCH 31.1 26.6 - 33.0 pg   MCHC 33.9 31.5 - 35.7 g/dL   RDW 56.2 13.0 - 86.5 %   Platelets 284 150 - 450 x10E3/uL   Neutrophils 63 Not Estab. %   Lymphs 25 Not Estab. %   Monocytes 8 Not Estab. %   Eos 3 Not Estab. %   Basos 0 Not Estab. %   Neutrophils Absolute 7.9 (H) 1.4 - 7.0 x10E3/uL   Lymphocytes Absolute 3.2 (H) 0.7 - 3.1 x10E3/uL   Monocytes Absolute 1.1 (H) 0.1 - 0.9 x10E3/uL   EOS (ABSOLUTE) 0.3 0.0 - 0.4 x10E3/uL   Basophils Absolute 0.0 0.0 - 0.2 x10E3/uL   Immature Granulocytes 1 Not Estab. %   Immature Grans (Abs) 0.1 0.0 - 0.1 x10E3/uL  Lipid Panel w/o Chol/HDL Ratio   Collection Time: 04/16/23  9:57 AM  Result Value Ref Range   Cholesterol, Total 216 (H) 100 - 199 mg/dL   Triglycerides 784 0 - 149 mg/dL   HDL 51 >69 mg/dL   VLDL Cholesterol Cal 26 5 - 40 mg/dL   LDL Chol Calc (NIH) 629 (H) 0 - 99 mg/dL  PSA   Collection Time: 04/16/23  9:57 AM  Result Value Ref Range   Prostate Specific Ag, Serum 0.5 0.0 - 4.0 ng/mL  TSH   Collection Time: 04/16/23  9:57 AM  Result Value Ref Range   TSH 2.880  0.450 - 4.500 uIU/mL      Assessment & Plan:   Problem List Items Addressed This Visit       Other   Obesity, morbid (HCC) - Primary   Tolerating his wegovy well. Down 2% of his body weight in 2 weeks. Will continue 2 weeks of 0.25mg  and then go up to the 0.5mg - will recheck in 5 weeks. Call with any concerns.       Relevant Medications   Semaglutide-Weight Management (WEGOVY) 0.5 MG/0.5ML SOAJ   Other Visit Diagnoses       Lipoma of face       Will refer to a different location. Call with any concerns.   Relevant Orders   Ambulatory referral to Plastic Surgery        Follow up plan: Return in about 5 weeks (around 01/06/2024).

## 2023-12-18 DIAGNOSIS — F4322 Adjustment disorder with anxiety: Secondary | ICD-10-CM | POA: Diagnosis not present

## 2023-12-21 DIAGNOSIS — Z419 Encounter for procedure for purposes other than remedying health state, unspecified: Secondary | ICD-10-CM | POA: Diagnosis not present

## 2024-01-20 ENCOUNTER — Other Ambulatory Visit: Payer: Self-pay

## 2024-01-20 ENCOUNTER — Encounter: Payer: Self-pay | Admitting: Family Medicine

## 2024-01-20 ENCOUNTER — Ambulatory Visit (INDEPENDENT_AMBULATORY_CARE_PROVIDER_SITE_OTHER): Admitting: Family Medicine

## 2024-01-20 DIAGNOSIS — Z419 Encounter for procedure for purposes other than remedying health state, unspecified: Secondary | ICD-10-CM | POA: Diagnosis not present

## 2024-01-20 MED ORDER — WEGOVY 1 MG/0.5ML ~~LOC~~ SOAJ
1.0000 mg | SUBCUTANEOUS | 1 refills | Status: DC
Start: 2024-01-20 — End: 2024-04-01
  Filled 2024-01-20: qty 2, 28d supply, fill #0
  Filled 2024-02-14: qty 2, 28d supply, fill #1

## 2024-01-20 NOTE — Progress Notes (Signed)
 BP (!) 146/66   Pulse 67   Ht 6' (1.829 m)   Wt (!) 300 lb 9.6 oz (136.4 kg)   SpO2 97%   BMI 40.77 kg/m    Subjective:    Patient ID: Fred Tran, male    DOB: 01/09/1966, 58 y.o.   MRN: [id number]  HPI: Fred Tran is a 58 y.o. male  Chief Complaint  Patient presents with   Obesity   OBESITY- was off his medicine for about 2 weeks due to supply issues, last week was the 1st week on the 0.5mg  Duration: chronic Previous attempts at weight loss: yes Complications of obesity:  HTN, low back pain, HLD, depression  Peak weight: 305# Weight loss goal: 200# Weight loss to date: 5# Requesting obesity pharmacotherapy: yes Current weight loss supplements/medications: yes Previous weight loss supplements/meds: no  Relevant past medical, surgical, family and social history reviewed and updated as indicated. Interim medical history since our last visit reviewed. Allergies and medications reviewed and updated.  Review of Systems  Constitutional: Negative.   Respiratory: Negative.    Cardiovascular: Negative.   Musculoskeletal: Negative.   Psychiatric/Behavioral: Negative.      Per HPI unless specifically indicated above     Objective:     BP (!) 146/66   Pulse 67   Ht 6' (1.829 m)   Wt (!) 300 lb 9.6 oz (136.4 kg)   SpO2 97%   BMI 40.77 kg/m   Wt Readings from Last 3 Encounters:  01/20/24 (!) 300 lb 9.6 oz (136.4 kg)  12/02/23 298 lb (135.2 kg)  09/24/23 (!) 305 lb 12.8 oz (138.7 kg)    Physical Exam Vitals and nursing note reviewed.  Constitutional:      General: He is not in acute distress.    Appearance: Normal appearance. He is not ill-appearing, toxic-appearing or diaphoretic.  HENT:     Head: Normocephalic and atraumatic.     Right Ear: External ear normal.     Left Ear: External ear normal.     Nose: Nose normal.     Mouth/Throat:     Mouth: Mucous membranes are moist.     Pharynx: Oropharynx is clear.  Eyes:     General: No scleral  icterus.       Right eye: No discharge.        Left eye: No discharge.     Extraocular Movements: Extraocular movements intact.     Conjunctiva/sclera: Conjunctivae normal.     Pupils: Pupils are equal, round, and reactive to light.  Cardiovascular:     Rate and Rhythm: Normal rate and regular rhythm.     Pulses: Normal pulses.     Heart sounds: Normal heart sounds. No murmur heard.    No friction rub. No gallop.  Pulmonary:     Effort: Pulmonary effort is normal. No respiratory distress.     Breath sounds: Normal breath sounds. No stridor. No wheezing, rhonchi or rales.  Chest:     Chest wall: No tenderness.  Musculoskeletal:        General: Normal range of motion.     Cervical back: Normal range of motion and neck supple.  Skin:    General: Skin is warm and dry.     Capillary Refill: Capillary refill takes less than 2 seconds.     Coloration: Skin is not jaundiced or pale.     Findings: No bruising, erythema, lesion or rash.  Neurological:     General: No focal  deficit present.     Mental Status: He is alert and oriented to person, place, and time. Mental status is at baseline.  Psychiatric:        Mood and Affect: Mood normal.        Behavior: Behavior normal.        Thought Content: Thought content normal.        Judgment: Judgment normal.     Results for orders placed or performed in visit on 04/16/23  Urinalysis, Routine w reflex microscopic   Collection Time: 04/16/23  9:56 AM  Result Value Ref Range   Specific Gravity, UA 1.015 1.005 - 1.030   pH, UA 7.0 5.0 - 7.5   Color, UA Yellow Yellow   Appearance Ur Clear Clear   Leukocytes,UA Negative Negative   Protein,UA Negative Negative/Trace   Glucose, UA Negative Negative   Ketones, UA Negative Negative   RBC, UA Negative Negative   Bilirubin, UA Negative Negative   Urobilinogen, Ur 0.2 0.2 - 1.0 mg/dL   Nitrite, UA Negative Negative   Microscopic Examination Comment   Comprehensive metabolic panel    Collection Time: 04/16/23  9:57 AM  Result Value Ref Range   Glucose 132 (H) 70 - 99 mg/dL   BUN 14 6 - 24 mg/dL   Creatinine, Ser 1.61 0.76 - 1.27 mg/dL   eGFR 096 >04 VW/UJW/1.19   BUN/Creatinine Ratio 16 9 - 20   Sodium 142 134 - 144 mmol/L   Potassium 4.4 3.5 - 5.2 mmol/L   Chloride 104 96 - 106 mmol/L   CO2 24 20 - 29 mmol/L   Calcium 9.4 8.7 - 10.2 mg/dL   Total Protein 6.9 6.0 - 8.5 g/dL   Albumin 4.5 3.8 - 4.9 g/dL   Globulin, Total 2.4 1.5 - 4.5 g/dL   Bilirubin Total 0.3 0.0 - 1.2 mg/dL   Alkaline Phosphatase 92 44 - 121 IU/L   AST 19 0 - 40 IU/L   ALT 19 0 - 44 IU/L  CBC with Differential/Platelet   Collection Time: 04/16/23  9:57 AM  Result Value Ref Range   WBC 12.6 (H) 3.4 - 10.8 x10E3/uL   RBC 5.14 4.14 - 5.80 x10E6/uL   Hemoglobin 16.0 13.0 - 17.7 g/dL   Hematocrit 14.7 82.9 - 51.0 %   MCV 92 79 - 97 fL   MCH 31.1 26.6 - 33.0 pg   MCHC 33.9 31.5 - 35.7 g/dL   RDW 56.2 13.0 - 86.5 %   Platelets 284 150 - 450 x10E3/uL   Neutrophils 63 Not Estab. %   Lymphs 25 Not Estab. %   Monocytes 8 Not Estab. %   Eos 3 Not Estab. %   Basos 0 Not Estab. %   Neutrophils Absolute 7.9 (H) 1.4 - 7.0 x10E3/uL   Lymphocytes Absolute 3.2 (H) 0.7 - 3.1 x10E3/uL   Monocytes Absolute 1.1 (H) 0.1 - 0.9 x10E3/uL   EOS (ABSOLUTE) 0.3 0.0 - 0.4 x10E3/uL   Basophils Absolute 0.0 0.0 - 0.2 x10E3/uL   Immature Granulocytes 1 Not Estab. %   Immature Grans (Abs) 0.1 0.0 - 0.1 x10E3/uL  Lipid Panel w/o Chol/HDL Ratio   Collection Time: 04/16/23  9:57 AM  Result Value Ref Range   Cholesterol, Total 216 (H) 100 - 199 mg/dL   Triglycerides 784 0 - 149 mg/dL   HDL 51 >69 mg/dL   VLDL Cholesterol Cal 26 5 - 40 mg/dL   LDL Chol Calc (NIH) 629 (H) 0 - 99 mg/dL  PSA   Collection Time: 04/16/23  9:57 AM  Result Value Ref Range   Prostate Specific Ag, Serum 0.5 0.0 - 4.0 ng/mL  TSH   Collection Time: 04/16/23  9:57 AM  Result Value Ref Range   TSH 2.880 0.450 - 4.500 uIU/mL       Assessment & Plan:   Problem List Items Addressed This Visit       Other   Obesity, morbid (HCC) - Primary   Has been off his wegovy  for 2 weeks and just restarted it. Will finish his 0.5mg  wegovy  and then increase to 1mg  weekly. Recheck 6 weeks.       Relevant Medications   Semaglutide -Weight Management (WEGOVY ) 1 MG/0.5ML SOAJ     Follow up plan: Return in about 6 weeks (around 03/02/2024).

## 2024-01-20 NOTE — Assessment & Plan Note (Signed)
 Has been off his wegovy  for 2 weeks and just restarted it. Will finish his 0.5mg  wegovy  and then increase to 1mg  weekly. Recheck 6 weeks.

## 2024-02-18 ENCOUNTER — Other Ambulatory Visit: Payer: Self-pay

## 2024-02-20 DIAGNOSIS — Z419 Encounter for procedure for purposes other than remedying health state, unspecified: Secondary | ICD-10-CM | POA: Diagnosis not present

## 2024-03-06 ENCOUNTER — Ambulatory Visit: Admitting: Family Medicine

## 2024-03-21 DIAGNOSIS — Z419 Encounter for procedure for purposes other than remedying health state, unspecified: Secondary | ICD-10-CM | POA: Diagnosis not present

## 2024-04-01 ENCOUNTER — Encounter: Payer: Self-pay | Admitting: Family Medicine

## 2024-04-01 ENCOUNTER — Ambulatory Visit (INDEPENDENT_AMBULATORY_CARE_PROVIDER_SITE_OTHER): Admitting: Family Medicine

## 2024-04-01 ENCOUNTER — Other Ambulatory Visit: Payer: Self-pay

## 2024-04-01 DIAGNOSIS — M5416 Radiculopathy, lumbar region: Secondary | ICD-10-CM | POA: Diagnosis not present

## 2024-04-01 MED ORDER — WEGOVY 1.7 MG/0.75ML ~~LOC~~ SOAJ
1.7000 mg | SUBCUTANEOUS | 3 refills | Status: DC
  Filled 2024-04-01 – 2024-04-03 (×4): qty 3, 28d supply, fill #0
  Filled 2024-04-25: qty 3, 28d supply, fill #1
  Filled 2024-06-03: qty 3, 28d supply, fill #2

## 2024-04-01 MED ORDER — IBUPROFEN 600 MG PO TABS
600.0000 mg | ORAL_TABLET | Freq: Four times a day (QID) | ORAL | 1 refills | Status: DC | PRN
  Filled 2024-04-01: qty 270, 68d supply, fill #0
  Filled 2024-06-03: qty 270, 68d supply, fill #1

## 2024-04-01 MED ORDER — OMEPRAZOLE 20 MG PO CPDR
20.0000 mg | DELAYED_RELEASE_CAPSULE | Freq: Every day | ORAL | 3 refills | Status: DC
  Filled 2024-04-01: qty 30, 30d supply, fill #0
  Filled 2024-04-26: qty 30, 30d supply, fill #1
  Filled 2024-05-18 – 2024-06-03 (×2): qty 30, 30d supply, fill #2

## 2024-04-01 NOTE — Assessment & Plan Note (Signed)
 Congratulated patient on 20lb weight loss! Continue diet and exercise. Continue to monitor. Recheck 3 months. Will increase his wegovy  to 1.7mg . Call with any concerns.

## 2024-04-01 NOTE — Progress Notes (Signed)
 BP 127/73   Pulse 68   Temp 98.1 F (36.7 C) (Oral)   Ht 6' (1.829 m)   Wt 285 lb 6.4 oz (129.5 kg)   SpO2 96%   BMI 38.71 kg/m    Subjective:    Patient ID: Fred Tran, male    DOB: [birth year], 58 y.o.   MRN: [id number]  HPI: Fred Tran is a 57 y.o. male  Chief Complaint  Patient presents with   Obesity   OBESITY- Tolerating his medicine well. No side effects. Has been taking the 1mg   Duration: chronic Previous attempts at weight loss: yes Complications of obesity:  HTN, low back pain, HLD, depression  Peak weight: 305# Weight loss goal: 200# Weight loss to date: 20# Requesting obesity pharmacotherapy: yes Current weight loss supplements/medications: yes Previous weight loss supplements/meds: no   Relevant past medical, surgical, family and social history reviewed and updated as indicated. Interim medical history since our last visit reviewed. Allergies and medications reviewed and updated.  Review of Systems  Constitutional: Negative.   Respiratory: Negative.    Cardiovascular: Negative.   Musculoskeletal:  Positive for back pain and myalgias. Negative for arthralgias, gait problem, joint swelling, neck pain and neck stiffness.  Skin: Negative.   Neurological: Negative.   Psychiatric/Behavioral: Negative.      Per HPI unless specifically indicated above     Objective:    BP 127/73   Pulse 68   Temp 98.1 F (36.7 C) (Oral)   Ht 6' (1.829 m)   Wt 285 lb 6.4 oz (129.5 kg)   SpO2 96%   BMI 38.71 kg/m   Wt Readings from Last 3 Encounters:  04/01/24 285 lb 6.4 oz (129.5 kg)  01/20/24 (!) 300 lb 9.6 oz (136.4 kg)  12/02/23 298 lb (135.2 kg)    Physical Exam Vitals and nursing note reviewed.  Constitutional:      General: He is not in acute distress.    Appearance: Normal appearance. He is obese. He is not ill-appearing, toxic-appearing or diaphoretic.  HENT:     Head: Normocephalic and atraumatic.     Right Ear: External ear normal.      Left Ear: External ear normal.     Nose: Nose normal.     Mouth/Throat:     Mouth: Mucous membranes are moist.     Pharynx: Oropharynx is clear.  Eyes:     General: No scleral icterus.       Right eye: No discharge.        Left eye: No discharge.     Extraocular Movements: Extraocular movements intact.     Conjunctiva/sclera: Conjunctivae normal.     Pupils: Pupils are equal, round, and reactive to light.  Cardiovascular:     Rate and Rhythm: Normal rate and regular rhythm.     Pulses: Normal pulses.     Heart sounds: Normal heart sounds. No murmur heard.    No friction rub. No gallop.  Pulmonary:     Effort: Pulmonary effort is normal. No respiratory distress.     Breath sounds: Normal breath sounds. No stridor. No wheezing, rhonchi or rales.  Chest:     Chest wall: No tenderness.  Musculoskeletal:        General: Normal range of motion.     Cervical back: Normal range of motion and neck supple.  Skin:    General: Skin is warm and dry.     Capillary Refill: Capillary refill takes less than 2 seconds.  Coloration: Skin is not jaundiced or pale.     Findings: No bruising, erythema, lesion or rash.  Neurological:     General: No focal deficit present.     Mental Status: He is alert and oriented to person, place, and time. Mental status is at baseline.  Psychiatric:        Mood and Affect: Mood normal.        Behavior: Behavior normal.        Thought Content: Thought content normal.        Judgment: Judgment normal.     Results for orders placed or performed in visit on 04/16/23  Urinalysis, Routine w reflex microscopic   Collection Time: 04/16/23  9:56 AM  Result Value Ref Range   Specific Gravity, UA 1.015 1.005 - 1.030   pH, UA 7.0 5.0 - 7.5   Color, UA Yellow Yellow   Appearance Ur Clear Clear   Leukocytes,UA Negative Negative   Protein,UA Negative Negative/Trace   Glucose, UA Negative Negative   Ketones, UA Negative Negative   RBC, UA Negative Negative    Bilirubin, UA Negative Negative   Urobilinogen, Ur 0.2 0.2 - 1.0 mg/dL   Nitrite, UA Negative Negative   Microscopic Examination Comment   Comprehensive metabolic panel   Collection Time: 04/16/23  9:57 AM  Result Value Ref Range   Glucose 132 (H) 70 - 99 mg/dL   BUN 14 6 - 24 mg/dL   Creatinine, Ser 9.13 0.76 - 1.27 mg/dL   eGFR 899 >40 fO/fpw/8.26   BUN/Creatinine Ratio 16 9 - 20   Sodium 142 134 - 144 mmol/L   Potassium 4.4 3.5 - 5.2 mmol/L   Chloride 104 96 - 106 mmol/L   CO2 24 20 - 29 mmol/L   Calcium 9.4 8.7 - 10.2 mg/dL   Total Protein 6.9 6.0 - 8.5 g/dL   Albumin 4.5 3.8 - 4.9 g/dL   Globulin, Total 2.4 1.5 - 4.5 g/dL   Bilirubin Total 0.3 0.0 - 1.2 mg/dL   Alkaline Phosphatase 92 44 - 121 IU/L   AST 19 0 - 40 IU/L   ALT 19 0 - 44 IU/L  CBC with Differential/Platelet   Collection Time: 04/16/23  9:57 AM  Result Value Ref Range   WBC 12.6 (H) 3.4 - 10.8 x10E3/uL   RBC 5.14 4.14 - 5.80 x10E6/uL   Hemoglobin 16.0 13.0 - 17.7 g/dL   Hematocrit 52.7 62.4 - 51.0 %   MCV 92 79 - 97 fL   MCH 31.1 26.6 - 33.0 pg   MCHC 33.9 31.5 - 35.7 g/dL   RDW 87.1 88.3 - 84.5 %   Platelets 284 150 - 450 x10E3/uL   Neutrophils 63 Not Estab. %   Lymphs 25 Not Estab. %   Monocytes 8 Not Estab. %   Eos 3 Not Estab. %   Basos 0 Not Estab. %   Neutrophils Absolute 7.9 (H) 1.4 - 7.0 x10E3/uL   Lymphocytes Absolute 3.2 (H) 0.7 - 3.1 x10E3/uL   Monocytes Absolute 1.1 (H) 0.1 - 0.9 x10E3/uL   EOS (ABSOLUTE) 0.3 0.0 - 0.4 x10E3/uL   Basophils Absolute 0.0 0.0 - 0.2 x10E3/uL   Immature Granulocytes 1 Not Estab. %   Immature Grans (Abs) 0.1 0.0 - 0.1 x10E3/uL  Lipid Panel w/o Chol/HDL Ratio   Collection Time: 04/16/23  9:57 AM  Result Value Ref Range   Cholesterol, Total 216 (H) 100 - 199 mg/dL   Triglycerides 854 0 - 149 mg/dL  HDL 51 >39 mg/dL   VLDL Cholesterol Cal 26 5 - 40 mg/dL   LDL Chol Calc (NIH) 860 (H) 0 - 99 mg/dL  PSA   Collection Time: 04/16/23  9:57 AM  Result Value  Ref Range   Prostate Specific Ag, Serum 0.5 0.0 - 4.0 ng/mL  TSH   Collection Time: 04/16/23  9:57 AM  Result Value Ref Range   TSH 2.880 0.450 - 4.500 uIU/mL      Assessment & Plan:   Problem List Items Addressed This Visit       Nervous and Auditory   Lumbar radiculopathy   Would like to see pain management at [facility]. Referral placed today.       Relevant Medications   Semaglutide -Weight Management (WEGOVY ) 1.7 MG/0.75ML SOAJ   Other Relevant Orders   Ambulatory referral to Pain Clinic     Other   Obesity, morbid (HCC) - Primary   Congratulated patient on 20lb weight loss! Continue diet and exercise. Continue to monitor. Recheck 3 months. Will increase his wegovy  to 1.7mg . Call with any concerns.       Relevant Medications   Semaglutide -Weight Management (WEGOVY ) 1.7 MG/0.75ML SOAJ     Follow up plan: Return in about 3 months (around 07/02/2024) for physical.

## 2024-04-01 NOTE — Assessment & Plan Note (Signed)
 Would like to see pain management at [facility]: [facility] Rhonda W. Referral placed today.

## 2024-04-02 ENCOUNTER — Telehealth: Payer: Self-pay

## 2024-04-02 ENCOUNTER — Other Ambulatory Visit: Payer: Self-pay

## 2024-04-02 NOTE — Telephone Encounter (Signed)
 Pharmacy Patient Advocate Encounter   Received notification from Onbase that prior authorization for Wegovy  1.7MG /0.75ML auto-injectors  is required/requested.   Insurance verification completed.   The patient is insured through [facility] [place] .   Per test claim: PA required; PA submitted to above mentioned insurance via CoverMyMeds Key/confirmation #/EOC [id number] Status is pending

## 2024-04-03 ENCOUNTER — Other Ambulatory Visit (HOSPITAL_COMMUNITY): Payer: Self-pay

## 2024-04-03 ENCOUNTER — Other Ambulatory Visit: Payer: Self-pay

## 2024-04-03 NOTE — Telephone Encounter (Signed)
 Pharmacy Patient Advocate Encounter  Received notification from [facility] Medicaid that Prior Authorization for Wegovy  1.7MG /0.75ML auto-injectors  has been APPROVED from 04/02/24 to 09/29/24. Ran test claim, Copay is $4. This test claim was processed through [facility]- copay amounts may vary at other pharmacies due to pharmacy/plan contracts, or as the patient moves through the different stages of their insurance plan.   PA #/Case ID/Reference #: [id number]

## 2024-04-13 ENCOUNTER — Other Ambulatory Visit: Payer: Self-pay

## 2024-04-21 DIAGNOSIS — Z419 Encounter for procedure for purposes other than remedying health state, unspecified: Secondary | ICD-10-CM | POA: Diagnosis not present

## 2024-05-18 ENCOUNTER — Other Ambulatory Visit: Payer: Self-pay

## 2024-05-22 DIAGNOSIS — Z419 Encounter for procedure for purposes other than remedying health state, unspecified: Secondary | ICD-10-CM | POA: Diagnosis not present

## 2024-05-30 ENCOUNTER — Other Ambulatory Visit: Payer: Self-pay

## 2024-06-03 ENCOUNTER — Other Ambulatory Visit: Payer: Self-pay

## 2024-06-04 ENCOUNTER — Encounter: Payer: Self-pay | Admitting: Family Medicine

## 2024-06-09 NOTE — Progress Notes (Unsigned)
 PROVIDER NOTE: Interpretation of information contained herein should be left to medically-trained personnel. Specific patient instructions are provided elsewhere under Patient Instructions section of medical record. This document was created in part using AI and STT-dictation technology, any transcriptional errors that may result from this process are unintentional.  Patient: Fred Tran  Service: E/M Encounter  Provider: Eric DELENA Como, Fred Tran  DOB: July 31, 1966  Delivery: Face-to-face  Specialty: Interventional Pain Management  MRN: [id number]  Setting: Ambulatory outpatient facility  Specialty designation: 09  Type: New Patient  Location: Outpatient office facility  PCP: Fred Duwaine SQUIBB, DO  DOS: 06/10/2024    Referring Prov.: Fred Duwaine SQUIBB, DO   Primary Reason(s) for Visit: Encounter for initial evaluation of one or more chronic problems (new to examiner) potentially causing chronic pain, and posing a threat to normal musculoskeletal function. (Level of risk: High) CC: No chief complaint on file.  HPI  Fred Tran is a 58 y.o. year old, male patient, who comes for the first time to our practice referred by Fred Duwaine SQUIBB, DO for our initial evaluation of his chronic pain. He has Tobacco abuse; Hyperlipidemia; Adjustment disorder with mixed anxiety and depressed mood; Other intervertebral disc degeneration, lumbar region; Lumbar radiculopathy; and Obesity, morbid (HCC) on their problem list. Today he comes in for evaluation of his No chief complaint on file.  Pain Assessment: Location:     Radiating:   Onset:   Duration:   Quality:   Severity:  /10 (subjective, self-reported pain score)  Effect on ADL:   Timing:   Modifying factors:   BP:    HR:    Onset and Duration: {Hx; Onset and Duration:210120511} Cause of pain: {Hx; Cause:210120521} Severity: {Pain Severity:210120502} Timing: {Symptoms; Timing:210120501} Aggravating Factors: {Causes; Aggravating pain  factors:210120507} Alleviating Factors: {Causes; Alleviating Factors:210120500} Associated Problems: {Hx; Associated problems:210120515} Quality of Pain: {Hx; Symptom quality or Descriptor:210120531} Previous Examinations or Tests: {Hx; Previous examinations or test:210120529} Previous Treatments: {Hx; Previous Treatment:210120503}  Fred Tran is being evaluated for possible interventional pain management therapies for the treatment of his chronic pain.  Discussed the use of AI scribe software for clinical note transcription with the patient, who gave verbal consent to proceed.  History of Present Illness            Fred Tran has been informed that this initial visit was an evaluation only.  On the follow up appointment I will go over the results, including ordered tests and available interventional therapies. At that time he will have the opportunity to decide whether to proceed with offered therapies or not. In the event that Fred Tran prefers avoiding interventional options, this will conclude our involvement in the case.  Medication management recommendations may be provided upon request.  Patient informed that diagnostic tests may be ordered to assist in identifying underlying causes, narrow the list of differential diagnoses and aid in determining candidacy for (or contraindications to) planned therapeutic interventions.  Historic Controlled Substance Pharmacotherapy Review PMP and historical list of controlled substances: ***  Most recently prescribed controlled substance(s): Opioid Analgesic: *** MME/day: *** mg/day  Historical Monitoring: The patient  reports no history of drug use. List of prior UDS Testing: No results found for: MDMA, COCAINSCRNUR, PCPSCRNUR, PCPQUANT, CANNABQUANT, THCU, ETH, CBDTHCR, D8THCCBX, D9THCCBX Historical Background Evaluation: [place] PMP: PDMP reviewed during this encounter. Review of the past 38-months conducted.              PMP NARX Score Report:  Narcotic: 030 Sedative: 060 Stimulant: 000  [place] Department of public safety, offender search: Engineer, mining Information) Non-contributory Risk Assessment Profile: Aberrant behavior: None observed or detected today Risk factors for fatal opioid overdose: None identified today PMP NARX Overdose Risk Score: 320 Fatal overdose hazard ratio (HR): Calculation deferred Non-fatal overdose hazard ratio (HR): Calculation deferred Risk of opioid abuse or dependence: 0.7-3.0% with doses <= 36 MME/day and 6.1-26% with doses >= 120 MME/day. Substance use disorder (SUD) risk level: See below Personal History of Substance Abuse (SUD-Substance use disorder):  Alcohol:    Illegal Drugs:    Rx Drugs:    ORT Risk Level calculation:    ORT Scoring interpretation table:  Score <3 = Low Risk for SUD  Score between 4-7 = Moderate Risk for SUD  Score >8 = High Risk for Opioid Abuse   PHQ-2 Depression Scale:  Total score:    PHQ-2 Scoring interpretation table: (Score and probability of major depressive disorder)  Score 0 = No depression  Score 1 = 15.4% Probability  Score 2 = 21.1% Probability  Score 3 = 38.4% Probability  Score 4 = 45.5% Probability  Score 5 = 56.4% Probability  Score 6 = 78.6% Probability   PHQ-9 Depression Scale:  Total score:    PHQ-9 Scoring interpretation table:  Score 0-4 = No depression  Score 5-9 = Mild depression  Score 10-14 = Moderate depression  Score 15-19 = Moderately severe depression  Score 20-27 = Severe depression (2.4 times higher risk of SUD and 2.89 times higher risk of overuse)   Pharmacologic Plan: As per protocol, I have not taken over any controlled substance management, pending the results of ordered tests and/or consults.            Initial impression: Pending review of available data and ordered tests.  Meds   Current Outpatient Medications:    baclofen  (LIORESAL ) 20 MG tablet, Take 20 mg by mouth 1 day or 1 dose., Disp: , Rfl:     DULoxetine (CYMBALTA) 30 MG capsule, Take by mouth., Disp: , Rfl:    fexofenadine  (ALLEGRA  ALLERGY) 180 MG tablet, Take 1 tablet (180 mg total) by mouth daily., Disp: 10 tablet, Rfl: 1   ibuprofen  (ADVIL ) 600 MG tablet, Take 1 tablet (600 mg total) by mouth every 6 (six) hours as needed for moderate pain (pain score 4-6) or mild pain (pain score 1-3)., Disp: 270 tablet, Rfl: 1   omeprazole  (PRILOSEC) 20 MG capsule, Take 1 capsule (20 mg total) by mouth daily., Disp: 30 capsule, Rfl: 3   pregabalin  (LYRICA ) 100 MG capsule, Take by mouth., Disp: , Rfl:    semaglutide -weight management (WEGOVY ) 1.7 MG/0.75ML SOAJ SQ injection, Inject 1.7 mg into the skin once a week., Disp: 3 mL, Rfl: 3   varenicline  (CHANTIX  CONTINUING MONTH PAK) 1 MG tablet, Take 1 tablet (1 mg total) by mouth 2 (two) times daily., Disp: 60 tablet, Rfl: 3  Imaging Review  Cervical Imaging: Cervical MR wo contrast: No results found for this or any previous visit.  Cervical MR wo contrast: No results found for this or any previous visit.  Cervical MR w/wo contrast: No results found for this or any previous visit.  Cervical MR w contrast: No results found for this or any previous visit.  Cervical CT wo contrast: No results found for this or any previous visit.  Cervical CT w/wo contrast: No results found for this or any previous visit.  Cervical CT w/wo contrast: No results found for this or any previous visit.  Cervical CT  w contrast: No results found for this or any previous visit.  Cervical CT outside: No results found for this or any previous visit.  Cervical DG 1 view: No results found for this or any previous visit.  Cervical DG 2-3 views: No results found for this or any previous visit.  Cervical DG F/E views: No results found for this or any previous visit.  Cervical DG 2-3 clearing views: No results found for this or any previous visit.  Cervical DG Bending/F/E views: No results found for this or any  previous visit.  Cervical DG complete: No results found for this or any previous visit.  Cervical DG Myelogram views: No results found for this or any previous visit.  Cervical DG Myelogram views: No results found for this or any previous visit.  Cervical Discogram views: No results found for this or any previous visit.   Shoulder Imaging: Shoulder-R MR w contrast: No results found for this or any previous visit.  Shoulder-L MR w contrast: No results found for this or any previous visit.  Shoulder-R MR w/wo contrast: No results found for this or any previous visit.  Shoulder-L MR w/wo contrast: No results found for this or any previous visit.  Shoulder-R MR wo contrast: No results found for this or any previous visit.  Shoulder-L MR wo contrast: No results found for this or any previous visit.  Shoulder-R CT w contrast: No results found for this or any previous visit.  Shoulder-L CT w contrast: No results found for this or any previous visit.  Shoulder-R CT w/wo contrast: No results found for this or any previous visit.  Shoulder-L CT w/wo contrast: No results found for this or any previous visit.  Shoulder-R CT wo contrast: No results found for this or any previous visit.  Shoulder-L CT wo contrast: No results found for this or any previous visit.  Shoulder-R DG Arthrogram: No results found for this or any previous visit.  Shoulder-L DG Arthrogram: No results found for this or any previous visit.  Shoulder-R DG 1 view: No results found for this or any previous visit.  Shoulder-L DG 1 view: No results found for this or any previous visit.  Shoulder-R DG: No results found for this or any previous visit.  Shoulder-L DG: No results found for this or any previous visit.   Thoracic Imaging: Thoracic MR wo contrast: No results found for this or any previous visit.  Thoracic MR wo contrast: No results found for this or any previous visit.  Thoracic MR w/wo contrast: No  results found for this or any previous visit.  Thoracic MR w contrast: No results found for this or any previous visit.  Thoracic CT wo contrast: No results found for this or any previous visit.  Thoracic CT w/wo contrast: No results found for this or any previous visit.  Thoracic CT w/wo contrast: No results found for this or any previous visit.  Thoracic CT w contrast: No results found for this or any previous visit.  Thoracic DG 2-3 views: No results found for this or any previous visit.  Thoracic DG 4 views: No results found for this or any previous visit.  Thoracic DG: No results found for this or any previous visit.  Thoracic DG w/swimmers view: No results found for this or any previous visit.  Thoracic DG Myelogram views: No results found for this or any previous visit.  Thoracic DG Myelogram views: No results found for this or any previous visit.   Lumbosacral  Imaging: Lumbar MR wo contrast: No results found for this or any previous visit.  Lumbar MR wo contrast: No results found for this or any previous visit.  Lumbar MR w/wo contrast: No results found for this or any previous visit.  Lumbar MR w/wo contrast: No results found for this or any previous visit.  Lumbar MR w contrast: No results found for this or any previous visit.  Lumbar CT wo contrast: No results found for this or any previous visit.  Lumbar CT w/wo contrast: No results found for this or any previous visit.  Lumbar CT w/wo contrast: No results found for this or any previous visit.  Lumbar CT w contrast: No results found for this or any previous visit.  Lumbar DG 1V: No results found for this or any previous visit.  Lumbar DG 1V (Clearing): No results found for this or any previous visit.  Lumbar DG 2-3V (Clearing): No results found for this or any previous visit.  Lumbar DG 2-3 views: No results found for this or any previous visit.  Lumbar DG (Complete) 4+V: Results for orders placed  during the hospital encounter of 06/19/18  DG Lumbar Spine Complete  Narrative CLINICAL DATA:  Acute right-sided low back pain with sciatica  EXAM: LUMBAR SPINE - COMPLETE 4+ VIEW  COMPARISON:  None.  FINDINGS: Mild dextroscoliosis at L2-3. Asymmetric disc space narrowing and spurring on the left at L2-3. Mild retrolisthesis L2-3.  Mild loss of height of the L2 vertebral body. Some of this is due to scoliosis however there may be a mild fracture L2. No other fracture. Mild disc degeneration at L3-4, L4-5, L5-S1. Negative for pars defect.  IMPRESSION: Scoliosis and degenerative changes most prominent on the left at L2-3.  Mild loss of height of L2 vertebral body consistent with mild fracture of indeterminate age.   Electronically Signed By: Carlin Gaskins M.D. On: 06/20/2018 Hip Imaging: Hip-R DG 2-3 views: Results for orders placed during the hospital encounter of 10/1 DG HIP UNILAT WITH PELVIS 2-3 VIEWS RIGHT  Narrative CLINICAL DATA:  Right hip pain  EXAM: DG HIP (WITH OR WITHOUT PELVIS) 2-3V RIGHT  COMPARISON:  None.  FINDINGS: There is no evidence of hip fracture or dislocation. There is no evidence of arthropathy or other focal bone abnormality.  IMPRESSION: Negative.   Electronically Signed By: Carlin Gaskins M.D. On: 06/20/2018 08:18  Foot Imaging: Foot-R DG Complete: Results for orders placed during the hospital encounter of 01/28/20 DG Foot Complete Right  Narrative CLINICAL DATA:  Foot injury and pain.  EXAM: RIGHT FOOT COMPLETE - 3+ VIEW  COMPARISON:  None.  FINDINGS: There is no evidence of fracture or dislocation. Incidental plantar heel spur. Sub fibular ossicle and mild spurring about the ankle.  IMPRESSION: No acute fracture.   Electronically Signed By: Cassondra Roulette M.D. On: 01/28/2020 06:27  Complexity Note: Imaging results reviewed.                         ROS  Cardiovascular: {Hx; Cardiovascular  History:210120525} Pulmonary or Respiratory: {Hx; Pumonary and/or Respiratory History:210120523} Neurological: {Hx; Neurological:210120504} Psychological-Psychiatric: {Hx; Psychological-Psychiatric History:210120512} Gastrointestinal: {Hx; Gastrointestinal:210120527} Genitourinary: {Hx; Genitourinary:210120506} Hematological: {Hx; Hematological:210120510} Endocrine: {Hx; Endocrine history:210120509} Rheumatologic: {Hx; Rheumatological:210120530} Musculoskeletal: {Hx; Musculoskeletal:210120528} Work History: {Hx; Work history:210120514}  Allergies  Mr. Uphoff has no known allergies.  Laboratory Chemistry Profile   Renal Lab Results  Component Value Date   BUN 14 04/16/2023   CREATININE 0.86 04/16/2023   BCR  16 04/16/2023   SPECGRAV 1.015 04/16/2023   PHUR 7.0 04/16/2023   PROTEINUR Negative 04/16/2023     Electrolytes Lab Results  Component Value Date   NA 142 04/16/2023   K 4.4 04/16/2023   CL 104 04/16/2023   CALCIUM 9.4 04/16/2023     Hepatic Lab Results  Component Value Date   AST 19 04/16/2023   ALT 19 04/16/2023   ALBUMIN 4.5 04/16/2023   ALKPHOS 92 04/16/2023     ID Lab Results  Component Value Date   HIV Non Reactive 11/09/2020   SARSCOV2NAA Not Detected 10/01/2019     Bone No results found for: VD25OH, CI874NY7UNU, CI6874NY7, CI7874NY7, 25OHVITD1, 25OHVITD2, 25OHVITD3, TESTOFREE, TESTOSTERONE   Endocrine Lab Results  Component Value Date   GLUCOSE 132 (H) 04/16/2023   GLUCOSEU Negative 04/16/2023   HGBA1C 5.5 07/05/2021   TSH 2.880 04/16/2023     Neuropathy Lab Results  Component Value Date   HGBA1C 5.5 07/05/2021   HIV Non Reactive 11/09/2020     CNS No results found for: COLORCSF, APPEARCSF, RBCCOUNTCSF, WBCCSF, POLYSCSF, LYMPHSCSF, EOSCSF, PROTEINCSF, GLUCCSF, JCVIRUS, CSFOLI, IGGCSF, LABACHR, ACETBL   Inflammation (CRP: Acute  ESR: Chronic) No results found for: CRP, ESRSEDRATE,  LATICACIDVEN   Rheumatology No results found for: RF, ANA, LABURIC, URICUR, LYMEIGGIGMAB, LYMEABIGMQN, HLAB27   Coagulation Lab Results  Component Value Date   INR 1.0 02/28/2021   PLT 284 04/16/2023     Cardiovascular Lab Results  Component Value Date   HGB 16.0 04/16/2023   HCT 47.2 04/16/2023     Screening Lab Results  Component Value Date   SARSCOV2NAA Not Detected 10/01/2019   HIV Non Reactive 11/09/2020     Cancer No results found for: CEA, CA125, LABCA2   Allergens No results found for: ALMOND, APPLE, ASPARAGUS, AVOCADO, BANANA, BARLEY, BASIL, BAYLEAF, GREENBEAN, LIMABEAN, WHITEBEAN, BEEFIGE, REDBEET, BLUEBERRY, BROCCOLI, CABBAGE, MELON, CARROT, CASEIN, CASHEWNUT, CAULIFLOWER, CELERY     Note: Lab results reviewed.  PFSH  Drug: Mr. Franze  reports no history of drug use. Alcohol:  reports no history of alcohol use. Tobacco:  reports that he has been smoking cigarettes. He has a 35 pack-year smoking history. He has never used smokeless tobacco. Medical:  has a past medical history of Arthritis, Hyperlipidemia, Obesity (BMI 35.0-39.9 without comorbidity), and Tobacco abuse. Family: family history includes Alcohol abuse in his father; Arthritis in his mother; Drug abuse in his father; Hyperlipidemia in his mother; Hypertension in his mother; Mental illness in his mother; Thyroid disease in his mother.  Past Surgical History:  Procedure Laterality Date   ARTHRODESIS,ANTERIOR INTERBODY TECHNIQUE,INCLUDE MINIMAL DISKECTOMY TO PREPARE INTERSPACE; LUMBAR  01/29/2022   BACK SURGERY     FINGER SURGERY Left    ring finger   KNEE SURGERY Right    Active Ambulatory Problems    Diagnosis Date Noted   Tobacco abuse    Hyperlipidemia    Adjustment disorder with mixed anxiety and depressed mood 04/03/2019   Other intervertebral disc degeneration, lumbar region 10/26/2020   Lumbar radiculopathy 12/08/2021    Obesity, morbid (HCC) 09/24/2023   Resolved Ambulatory Problems    Diagnosis Date Noted   Obesity (BMI 35.0-39.9 without comorbidity)    Need for influenza vaccination 07/05/2021   Dizziness 07/05/2021   Past Medical History:  Diagnosis Date   Arthritis    Constitutional Exam  General appearance: Well nourished, well developed, and well hydrated. In no apparent acute distress There were no vitals filed for this visit. BMI Assessment:  Estimated body mass index is 38.71 kg/m as calculated from the following:   Height as of 04/01/24: 6' (1.829 m).   Weight as of 04/01/24: 285 lb 6.4 oz (129.5 kg).  BMI interpretation table: BMI level Category Range association with higher incidence of chronic pain  <18 kg/m2 Underweight   18.5-24.9 kg/m2 Ideal body weight   25-29.9 kg/m2 Overweight Increased incidence by 20%  30-34.9 kg/m2 Obese (Class I) Increased incidence by 68%  35-39.9 kg/m2 Severe obesity (Class II) Increased incidence by 136%  >40 kg/m2 Extreme obesity (Class III) Increased incidence by 254%   Patient's current BMI Ideal Body weight  There is no height or weight on file to calculate BMI. Patient weight not recorded   BMI Readings from Last 4 Encounters:  04/01/24 38.71 kg/m  01/20/24 40.77 kg/m  12/02/23 40.42 kg/m  09/24/23 41.47 kg/m   Wt Readings from Last 4 Encounters:  04/01/24 285 lb 6.4 oz (129.5 kg)  01/20/24 (!) 300 lb 9.6 oz (136.4 kg)  12/02/23 298 lb (135.2 kg)  09/24/23 (!) 305 lb 12.8 oz (138.7 kg)    Psych/Mental status: Alert, oriented x 3 (person, place, & time)       Eyes: PERLA Respiratory: No evidence of acute respiratory distress  Assessment  Primary Diagnosis & Pertinent Problem List: There were no encounter diagnoses.  Visit Diagnosis (New problems to examiner): No diagnosis found. Plan of Care (Initial workup plan)  Note: Mr. Guice was reminded that as per protocol, today's visit has been an evaluation only. We have not taken  over the patient's controlled substance management.  Problem-specific plan: Assessment and Plan            Lab Orders  No laboratory test(s) ordered today   Imaging Orders  No imaging studies ordered today   Referral Orders  No referral(s) requested today   Procedure Orders    No procedure(s) ordered today   Pharmacotherapy (current): Medications ordered:  No orders of the defined types were placed in this encounter.  Medications administered during this visit: Fred FABIENE Arnett had no medications administered during this visit.   Analgesic Pharmacotherapy:  Opioid Analgesics: For patients currently taking or requesting to take opioid analgesics, in accordance with [place]  Medical Board Guidelines, we will assess their risks and indications for the use of these substances. After completing our evaluation, we may offer recommendations, but we no longer take patients for medication management. The prescribing physician will ultimately decide, based on his/her training and level of comfort whether to adopt any of the recommendations, including whether or not to prescribe such medicines.  Membrane stabilizer: To be determined at a later time  Muscle relaxant: To be determined at a later time  NSAID: To be determined at a later time  Other analgesic(s): To be determined at a later time   Interventional management options: Mr. Gales was informed that there is no guarantee that he would be a candidate for interventional therapies. The decision will be based on the results of diagnostic studies, as well as Mr. Ybarbo risk profile.  Procedure(s) under consideration:  Pending results of ordered studies     Interventional Therapies  Risk Factors  Considerations  Medical Comorbidities:     Planned  Pending:      Under consideration:   Pending   Completed: (Analgesic benefit)1  None at this time   Therapeutic  Palliative (PRN) options:   None established    Completed by other providers:   None reported  1(Analgesic benefit): Expressed in percentage (%). (Local anesthetic[LA] +/- sedation  L.A.Local Anesthetic  Steroid benefit  Ongoing benefit)   Provider-requested follow-up: No follow-ups on file.  Future Appointments  Date Time Provider Department Center  06/10/2024 11:00 AM Fred Glisson, Fred Tran [facility] None  07/07/2024  2:40 PM Fred Duwaine SQUIBB, DO [facility] [address]   I discussed the assessment and treatment plan with the patient. The patient was provided an opportunity to ask questions and all were answered. The patient agreed with the plan and demonstrated an understanding of the instructions.  Patient advised to call back or seek an in-person evaluation if the symptoms or condition worsens.  Duration of encounter: *** minutes.  Total time on encounter, as per AMA guidelines included both the face-to-face and non-face-to-face time personally spent by the physician and/or other qualified health care professional(s) on the day of the encounter (includes time in activities that require the physician or other qualified health care professional and does not include time in activities normally performed by clinical staff). Physician's time may include the following activities when performed: Preparing to see the patient (e.g., pre-charting review of records, searching for previously ordered imaging, lab work, and nerve conduction tests) Review of prior analgesic pharmacotherapies. Reviewing PMP Interpreting ordered tests (e.g., lab work, imaging, nerve conduction tests) Performing post-procedure evaluations, including interpretation of diagnostic procedures Obtaining and/or reviewing separately obtained history Performing a medically appropriate examination and/or evaluation Counseling and educating the patient/family/caregiver Ordering medications, tests, or procedures Referring and communicating with other health care  professionals (when not separately reported) Documenting clinical information in the electronic or other health record Independently interpreting results (not separately reported) and communicating results to the patient/ family/caregiver Care coordination (not separately reported)  Note by: Tran DELENA Tanya, Fred Tran (TTS and AI technology used. I apologize for any typographical errors that were not detected and corrected.) Date: 06/10/2024; Time: 3:36 PM

## 2024-06-10 ENCOUNTER — Ambulatory Visit: Attending: Pain Medicine | Admitting: Pain Medicine

## 2024-06-10 ENCOUNTER — Encounter: Payer: Self-pay | Admitting: Pain Medicine

## 2024-06-10 VITALS — BP 138/64 | HR 76 | Temp 97.2°F | Ht 72.0 in | Wt 275.0 lb

## 2024-06-10 DIAGNOSIS — M899 Disorder of bone, unspecified: Secondary | ICD-10-CM | POA: Insufficient documentation

## 2024-06-10 DIAGNOSIS — M4310 Spondylolisthesis, site unspecified: Secondary | ICD-10-CM | POA: Insufficient documentation

## 2024-06-10 DIAGNOSIS — G8929 Other chronic pain: Secondary | ICD-10-CM | POA: Diagnosis present

## 2024-06-10 DIAGNOSIS — M25552 Pain in left hip: Secondary | ICD-10-CM | POA: Diagnosis not present

## 2024-06-10 DIAGNOSIS — Z981 Arthrodesis status: Secondary | ICD-10-CM | POA: Insufficient documentation

## 2024-06-10 DIAGNOSIS — Z79899 Other long term (current) drug therapy: Secondary | ICD-10-CM | POA: Insufficient documentation

## 2024-06-10 DIAGNOSIS — Z967 Presence of other bone and tendon implants: Secondary | ICD-10-CM | POA: Diagnosis not present

## 2024-06-10 DIAGNOSIS — M5417 Radiculopathy, lumbosacral region: Secondary | ICD-10-CM | POA: Insufficient documentation

## 2024-06-10 DIAGNOSIS — R9389 Abnormal findings on diagnostic imaging of other specified body structures: Secondary | ICD-10-CM | POA: Insufficient documentation

## 2024-06-10 DIAGNOSIS — G894 Chronic pain syndrome: Secondary | ICD-10-CM | POA: Diagnosis not present

## 2024-06-10 DIAGNOSIS — M4316 Spondylolisthesis, lumbar region: Secondary | ICD-10-CM | POA: Diagnosis not present

## 2024-06-10 DIAGNOSIS — M25512 Pain in left shoulder: Secondary | ICD-10-CM | POA: Insufficient documentation

## 2024-06-10 DIAGNOSIS — M961 Postlaminectomy syndrome, not elsewhere classified: Secondary | ICD-10-CM | POA: Insufficient documentation

## 2024-06-10 DIAGNOSIS — M545 Low back pain, unspecified: Secondary | ICD-10-CM | POA: Diagnosis not present

## 2024-06-10 DIAGNOSIS — M25511 Pain in right shoulder: Secondary | ICD-10-CM | POA: Diagnosis not present

## 2024-06-10 DIAGNOSIS — Z789 Other specified health status: Secondary | ICD-10-CM | POA: Diagnosis not present

## 2024-06-10 DIAGNOSIS — M79605 Pain in left leg: Secondary | ICD-10-CM | POA: Diagnosis not present

## 2024-06-10 DIAGNOSIS — M25562 Pain in left knee: Secondary | ICD-10-CM | POA: Diagnosis not present

## 2024-06-10 NOTE — Progress Notes (Signed)
 Safety precautions to be maintained throughout the outpatient stay will include: orient to surroundings, keep bed in low position, maintain call bell within reach at all times, provide assistance with transfer out of bed and ambulation.

## 2024-06-10 NOTE — Patient Instructions (Signed)

## 2024-06-14 LAB — COMPLIANCE DRUG ANALYSIS, UR

## 2024-06-15 ENCOUNTER — Ambulatory Visit
Admission: RE | Admit: 2024-06-15 | Discharge: 2024-06-15 | Disposition: A | Discharge status: Home / Self Care | Attending: Pain Medicine | Admitting: Pain Medicine

## 2024-06-15 ENCOUNTER — Ambulatory Visit
Admission: RE | Admit: 2024-06-15 | Discharge: 2024-06-15 | Disposition: A | Source: Ambulatory Visit | Attending: Pain Medicine | Admitting: Pain Medicine

## 2024-06-15 DIAGNOSIS — M545 Low back pain, unspecified: Secondary | ICD-10-CM

## 2024-06-15 DIAGNOSIS — M19011 Primary osteoarthritis, right shoulder: Secondary | ICD-10-CM | POA: Diagnosis not present

## 2024-06-15 DIAGNOSIS — M1712 Unilateral primary osteoarthritis, left knee: Secondary | ICD-10-CM | POA: Diagnosis not present

## 2024-06-15 DIAGNOSIS — Z967 Presence of other bone and tendon implants: Secondary | ICD-10-CM

## 2024-06-15 DIAGNOSIS — M25512 Pain in left shoulder: Secondary | ICD-10-CM | POA: Insufficient documentation

## 2024-06-15 DIAGNOSIS — M79605 Pain in left leg: Secondary | ICD-10-CM

## 2024-06-15 DIAGNOSIS — Z981 Arthrodesis status: Secondary | ICD-10-CM | POA: Diagnosis not present

## 2024-06-15 DIAGNOSIS — G8929 Other chronic pain: Secondary | ICD-10-CM | POA: Insufficient documentation

## 2024-06-15 DIAGNOSIS — R9389 Abnormal findings on diagnostic imaging of other specified body structures: Secondary | ICD-10-CM | POA: Insufficient documentation

## 2024-06-15 DIAGNOSIS — M25562 Pain in left knee: Secondary | ICD-10-CM | POA: Insufficient documentation

## 2024-06-15 DIAGNOSIS — Z4789 Encounter for other orthopedic aftercare: Secondary | ICD-10-CM | POA: Diagnosis not present

## 2024-06-15 DIAGNOSIS — M961 Postlaminectomy syndrome, not elsewhere classified: Secondary | ICD-10-CM | POA: Insufficient documentation

## 2024-06-15 DIAGNOSIS — M4310 Spondylolisthesis, site unspecified: Secondary | ICD-10-CM | POA: Insufficient documentation

## 2024-06-15 DIAGNOSIS — M25511 Pain in right shoulder: Secondary | ICD-10-CM | POA: Insufficient documentation

## 2024-06-15 DIAGNOSIS — M25552 Pain in left hip: Secondary | ICD-10-CM | POA: Diagnosis not present

## 2024-06-15 DIAGNOSIS — M19012 Primary osteoarthritis, left shoulder: Secondary | ICD-10-CM | POA: Diagnosis not present

## 2024-06-19 LAB — COMP. METABOLIC PANEL (12)
AST: 24 IU/L (ref 0–40)
Albumin: 4.6 g/dL (ref 3.8–4.9)
Alkaline Phosphatase: 121 IU/L (ref 47–123)
BUN/Creatinine Ratio: 10 (ref 9–20)
BUN: 7 mg/dL (ref 6–24)
Bilirubin Total: 0.3 mg/dL (ref 0.0–1.2)
Calcium: 9.8 mg/dL (ref 8.7–10.2)
Chloride: 102 mmol/L (ref 96–106)
Creatinine, Ser: 0.73 mg/dL — ABNORMAL LOW (ref 0.76–1.27)
Globulin, Total: 2.8 g/dL (ref 1.5–4.5)
Glucose: 103 mg/dL — ABNORMAL HIGH (ref 70–99)
Potassium: 5.1 mmol/L (ref 3.5–5.2)
Sodium: 142 mmol/L (ref 134–144)
Total Protein: 7.4 g/dL (ref 6.0–8.5)
eGFR: 105 mL/min/1.73 (ref >59)

## 2024-06-19 LAB — MAGNESIUM: Magnesium: 2.2 mg/dL (ref 1.6–2.3)

## 2024-06-19 LAB — 25-HYDROXY VITAMIN D LCMS D2+D3
25-Hydroxy, Vitamin D-2: <1 ng/mL
25-Hydroxy, Vitamin D-3: 30 ng/mL
25-Hydroxy, Vitamin D: 30 ng/mL

## 2024-06-19 LAB — VITAMIN B12: Vitamin B-12: 471 pg/mL (ref 232–1245)

## 2024-06-19 LAB — SEDIMENTATION RATE: Sed Rate: 26 mm/h (ref 0–30)

## 2024-06-19 LAB — C-REACTIVE PROTEIN: CRP: 5 mg/L (ref 0–10)

## 2024-06-23 ENCOUNTER — Ambulatory Visit
Admission: RE | Admit: 2024-06-23 | Discharge: 2024-06-23 | Disposition: A | Discharge status: Home / Self Care | Source: Ambulatory Visit | Attending: Pain Medicine | Admitting: Pain Medicine

## 2024-06-23 DIAGNOSIS — G8929 Other chronic pain: Secondary | ICD-10-CM | POA: Diagnosis not present

## 2024-06-23 DIAGNOSIS — M4310 Spondylolisthesis, site unspecified: Secondary | ICD-10-CM | POA: Insufficient documentation

## 2024-06-23 DIAGNOSIS — M961 Postlaminectomy syndrome, not elsewhere classified: Secondary | ICD-10-CM | POA: Diagnosis not present

## 2024-06-23 DIAGNOSIS — M4807 Spinal stenosis, lumbosacral region: Secondary | ICD-10-CM | POA: Diagnosis not present

## 2024-06-23 DIAGNOSIS — M5417 Radiculopathy, lumbosacral region: Secondary | ICD-10-CM | POA: Insufficient documentation

## 2024-06-23 DIAGNOSIS — M25562 Pain in left knee: Secondary | ICD-10-CM | POA: Insufficient documentation

## 2024-06-23 DIAGNOSIS — M25552 Pain in left hip: Secondary | ICD-10-CM | POA: Diagnosis not present

## 2024-06-23 DIAGNOSIS — R9389 Abnormal findings on diagnostic imaging of other specified body structures: Secondary | ICD-10-CM | POA: Diagnosis not present

## 2024-06-23 DIAGNOSIS — M79605 Pain in left leg: Secondary | ICD-10-CM | POA: Insufficient documentation

## 2024-06-23 DIAGNOSIS — M545 Low back pain, unspecified: Secondary | ICD-10-CM | POA: Diagnosis not present

## 2024-06-23 DIAGNOSIS — M48061 Spinal stenosis, lumbar region without neurogenic claudication: Secondary | ICD-10-CM | POA: Diagnosis not present

## 2024-06-23 DIAGNOSIS — M5116 Intervertebral disc disorders with radiculopathy, lumbar region: Secondary | ICD-10-CM | POA: Diagnosis not present

## 2024-06-23 DIAGNOSIS — Z967 Presence of other bone and tendon implants: Secondary | ICD-10-CM | POA: Diagnosis not present

## 2024-06-23 DIAGNOSIS — Z981 Arthrodesis status: Secondary | ICD-10-CM | POA: Insufficient documentation

## 2024-06-23 DIAGNOSIS — D1809 Hemangioma of other sites: Secondary | ICD-10-CM | POA: Diagnosis not present

## 2024-06-23 DIAGNOSIS — M5117 Intervertebral disc disorders with radiculopathy, lumbosacral region: Secondary | ICD-10-CM | POA: Diagnosis not present

## 2024-06-25 ENCOUNTER — Other Ambulatory Visit: Payer: Self-pay

## 2024-07-03 ENCOUNTER — Encounter: Admitting: Family Medicine

## 2024-07-07 ENCOUNTER — Ambulatory Visit (INDEPENDENT_AMBULATORY_CARE_PROVIDER_SITE_OTHER): Admitting: Family Medicine

## 2024-07-07 ENCOUNTER — Other Ambulatory Visit: Payer: Self-pay

## 2024-07-07 VITALS — BP 147/77 | HR 66 | Temp 97.5°F | Ht 72.0 in | Wt 276.6 lb

## 2024-07-07 DIAGNOSIS — Z23 Encounter for immunization: Secondary | ICD-10-CM

## 2024-07-07 DIAGNOSIS — F17200 Nicotine dependence, unspecified, uncomplicated: Secondary | ICD-10-CM | POA: Diagnosis not present

## 2024-07-07 DIAGNOSIS — R0683 Snoring: Secondary | ICD-10-CM

## 2024-07-07 DIAGNOSIS — Z0000 Encounter for general adult medical examination without abnormal findings: Secondary | ICD-10-CM

## 2024-07-07 LAB — BAYER DCA HB A1C WAIVED: HB A1C (BAYER DCA - WAIVED): 5.8 % — ABNORMAL HIGH (ref 4.8–5.6)

## 2024-07-07 MED ORDER — OMEPRAZOLE 20 MG PO CPDR
20.0000 mg | DELAYED_RELEASE_CAPSULE | Freq: Every day | ORAL | 3 refills | Status: AC
  Filled 2024-07-07: qty 30, 30d supply, fill #0
  Filled 2024-08-11: qty 30, 30d supply, fill #1
  Filled 2024-09-11: qty 30, 30d supply, fill #2

## 2024-07-07 NOTE — Progress Notes (Signed)
 BP (!) 147/77   Pulse 66   Temp (!) 97.5 F (36.4 C) (Oral)   Ht 6' (1.829 m)   Wt 276 lb 9.6 oz (125.5 kg)   SpO2 97%   BMI 37.51 kg/m    Subjective:    Patient ID: Fred Tran, male    DOB: [birth year], 58 y.o.   MRN: [id number]  HPI: Fred Tran is a 58 y.o. male presenting on 07/07/2024 for comprehensive medical examination. Current medical complaints include:  ????SLEEP APNEA Sleep apnea status: unknown Duration: chronic Satisfied with current treatment?:  no Last sleep study: never Treatments attempted: none Wakes feeling refreshed:  no Daytime hypersomnolence:  yes Fatigue:  yes Insomnia:  no Good sleep hygiene:  yes Difficulty falling asleep:  no Difficulty staying asleep:  no Snoring bothers bed partner:  yes Observed apnea by bed partner: yes Obesity:  yes Hypertension: yes  Pulmonary hypertension:  no Coronary artery disease:  no  He currently lives with: wife and kids and grandkids Interim Problems from his last visit: no  Depression Screen done today and results listed below:     06/10/2024   11:07 AM 12/02/2023    4:21 PM 04/16/2023    9:51 AM 07/16/2022    9:15 AM 06/15/2022    8:12 AM  Depression screen PHQ 2/9  Decreased Interest 0 0 0 0 0  Down, Depressed, Hopeless 1 0 0 1 1  PHQ - 2 Score 1 0 0 1 1  Altered sleeping  0 0 0 1  Tired, decreased energy  0 0 1 1  Change in appetite  0 0 1 0  Feeling bad or failure about yourself   0 0 0 0  Trouble concentrating  0 0 0 0  Moving slowly or fidgety/restless  0 0 0 0  Suicidal thoughts  0 0 0 0  PHQ-9 Score  0 0 3 3  Difficult doing work/chores  Not difficult at all Not difficult at all Not difficult at all Somewhat difficult   Past Medical History:  Past Medical History:  Diagnosis Date   Arthritis    Hyperlipidemia    Obesity (BMI 35.0-39.9 without comorbidity)    Tobacco abuse     Surgical History:  Past Surgical History:  Procedure Laterality Date   ARTHRODESIS,ANTERIOR  INTERBODY TECHNIQUE,INCLUDE MINIMAL DISKECTOMY TO PREPARE INTERSPACE; LUMBAR  01/29/2022   BACK SURGERY     FINGER SURGERY Left    ring finger   KNEE SURGERY Right     Medications:  Current Outpatient Medications on File Prior to Visit  Medication Sig   baclofen  (LIORESAL ) 20 MG tablet Take 20 mg by mouth 1 day or 1 dose.   fexofenadine  (ALLEGRA  ALLERGY) 180 MG tablet Take 1 tablet (180 mg total) by mouth daily.   ibuprofen  (ADVIL ) 600 MG tablet Take 1 tablet (600 mg total) by mouth every 6 (six) hours as needed for moderate pain (pain score 4-6) or mild pain (pain score 1-3).   No current facility-administered medications on file prior to visit.    Allergies:  No Known Allergies  Social History:  Social History   Socioeconomic History   Marital status: Married    Spouse name: Not on file   Number of children: Not on file   Years of education: Not on file   Highest education level: 12th grade  Occupational History   Not on file  Tobacco Use   Smoking status: Every Day    Current packs/day:  1.00    Average packs/day: 1 pack/day for 35.0 years (35.0 ttl pk-yrs)    Types: Cigarettes   Smokeless tobacco: Never  Vaping Use   Vaping status: Never Used  Substance and Sexual Activity   Alcohol use: No   Drug use: No   Sexual activity: Yes  Other Topics Concern   Not on file  Social History Narrative   Not on file   Social Drivers of Health   Financial Resource Strain: Low Risk  (07/07/2024)   Overall Financial Resource Strain (CARDIA)    Difficulty of Paying Living Expenses: Not hard at all  Food Insecurity: No Food Insecurity (07/07/2024)   Hunger Vital Sign    Worried About Running Out of Food in the Last Year: Never true    Ran Out of Food in the Last Year: Never true  Transportation Needs: No Transportation Needs (07/07/2024)   PRAPARE - Administrator, Civil Service (Medical): No    Lack of Transportation (Non-Medical): No  Physical Activity:  Inactive (07/07/2024)   Exercise Vital Sign    Days of Exercise per Week: 0 days    Minutes of Exercise per Session: Not on file  Stress: No Stress Concern Present (07/07/2024)   [employer] of Occupational Health - Occupational Stress Questionnaire    Feeling of Stress: Only a little  Social Connections: Socially Isolated (07/07/2024)   Social Connection and Isolation Panel    Frequency of Communication with Friends and Family: Never    Frequency of Social Gatherings with Friends and Family: Twice a week    Attends Religious Services: Never    Database Administrator or Organizations: No    Attends Engineer, Structural: Not on file    Marital Status: Married  Catering Manager Violence: Not At Risk (07/07/2024)   Humiliation, Afraid, Rape, and Kick questionnaire    Fear of Current or Ex-Partner: No    Emotionally Abused: No    Physically Abused: No    Sexually Abused: No   Social History   Tobacco Use  Smoking Status Every Day   Current packs/day: 1.00   Average packs/day: 1 pack/day for 35.0 years (35.0 ttl pk-yrs)   Types: Cigarettes  Smokeless Tobacco Never   Social History   Substance and Sexual Activity  Alcohol Use No    Family History:  Family History  Problem Relation Age of Onset   Arthritis Mother    Hyperlipidemia Mother    Hypertension Mother    Mental illness Mother    Thyroid disease Mother    Alcohol abuse Father    Drug abuse Father     Past medical history, surgical history, medications, allergies, family history and social history reviewed with patient today and changes made to appropriate areas of the chart.   Review of Systems  Constitutional: Negative.   HENT: Negative.    Eyes: Negative.   Respiratory: Negative.    Cardiovascular: Negative.   Gastrointestinal: Negative.   Genitourinary:  Positive for frequency. Negative for dysuria, flank pain, hematuria and urgency.  Musculoskeletal:  Positive for back pain. Negative for  falls, joint pain, myalgias and neck pain.  Skin: Negative.   Neurological: Negative.   Endo/Heme/Allergies:  Positive for environmental allergies. Negative for polydipsia. Does not bruise/bleed easily.  Psychiatric/Behavioral: Negative.     All other ROS negative except what is listed above and in the HPI.      Objective:    BP (!) 147/77   Pulse 66  Temp (!) 97.5 F (36.4 C) (Oral)   Ht 6' (1.829 m)   Wt 276 lb 9.6 oz (125.5 kg)   SpO2 97%   BMI 37.51 kg/m   Wt Readings from Last 3 Encounters:  07/07/24 276 lb 9.6 oz (125.5 kg)  06/10/24 275 lb (124.7 kg)  04/01/24 285 lb 6.4 oz (129.5 kg)    Physical Exam Vitals and nursing note reviewed.  Constitutional:      General: He is not in acute distress.    Appearance: Normal appearance. He is obese. He is not ill-appearing, toxic-appearing or diaphoretic.  HENT:     Head: Normocephalic and atraumatic.     Right Ear: Tympanic membrane, ear canal and external ear normal. There is no impacted cerumen.     Left Ear: Tympanic membrane, ear canal and external ear normal. There is no impacted cerumen.     Nose: Nose normal. No congestion or rhinorrhea.     Mouth/Throat:     Mouth: Mucous membranes are moist.     Pharynx: Oropharynx is clear. No oropharyngeal exudate or posterior oropharyngeal erythema.  Eyes:     General: No scleral icterus.       Right eye: No discharge.        Left eye: No discharge.     Extraocular Movements: Extraocular movements intact.     Conjunctiva/sclera: Conjunctivae normal.     Pupils: Pupils are equal, round, and reactive to light.  Neck:     Vascular: No carotid bruit.  Cardiovascular:     Rate and Rhythm: Normal rate and regular rhythm.     Pulses: Normal pulses.     Heart sounds: No murmur heard.    No friction rub. No gallop.  Pulmonary:     Effort: Pulmonary effort is normal. No respiratory distress.     Breath sounds: Normal breath sounds. No stridor. No wheezing, rhonchi or rales.   Chest:     Chest wall: No tenderness.  Abdominal:     General: Abdomen is flat. Bowel sounds are normal. There is no distension.     Palpations: Abdomen is soft. There is no mass.     Tenderness: There is no abdominal tenderness. There is no right CVA tenderness, left CVA tenderness, guarding or rebound.     Hernia: No hernia is present.  Genitourinary:    Comments: Genital exam deferred with shared decision making Musculoskeletal:        General: No swelling, tenderness, deformity or signs of injury.     Cervical back: Normal range of motion and neck supple. No rigidity. No muscular tenderness.     Right lower leg: No edema.     Left lower leg: No edema.  Lymphadenopathy:     Cervical: No cervical adenopathy.  Skin:    General: Skin is warm and dry.     Capillary Refill: Capillary refill takes less than 2 seconds.     Coloration: Skin is not jaundiced or pale.     Findings: No bruising, erythema, lesion or rash.  Neurological:     General: No focal deficit present.     Mental Status: He is alert and oriented to person, place, and time.     Cranial Nerves: No cranial nerve deficit.     Sensory: No sensory deficit.     Motor: No weakness.     Coordination: Coordination normal.     Gait: Gait normal.     Deep Tendon Reflexes: Reflexes normal.  Psychiatric:  Mood and Affect: Mood normal.        Behavior: Behavior normal.        Thought Content: Thought content normal.        Judgment: Judgment normal.     Results for orders placed or performed in visit on 06/10/24  Comp. Metabolic Panel (12)   Collection Time: 06/10/24 11:54 AM  Result Value Ref Range   Glucose 103 (H) 70 - 99 mg/dL   BUN 7 6 - 24 mg/dL   Creatinine, Ser 9.26 (L) 0.76 - 1.27 mg/dL   eGFR 894 >40 fO/fpw/8.26   BUN/Creatinine Ratio 10 9 - 20   Sodium 142 134 - 144 mmol/L   Potassium 5.1 3.5 - 5.2 mmol/L   Chloride 102 96 - 106 mmol/L   Calcium 9.8 8.7 - 10.2 mg/dL   Total Protein 7.4 6.0 - 8.5  g/dL   Albumin 4.6 3.8 - 4.9 g/dL   Globulin, Total 2.8 1.5 - 4.5 g/dL   Bilirubin Total 0.3 0.0 - 1.2 mg/dL   Alkaline Phosphatase 121 47 - 123 IU/L   AST 24 0 - 40 IU/L  Magnesium   Collection Time: 06/10/24 11:54 AM  Result Value Ref Range   Magnesium 2.2 1.6 - 2.3 mg/dL  Vitamin B12   Collection Time: 06/10/24 11:54 AM  Result Value Ref Range   Vitamin B-12 471 232 - 1,245 pg/mL  Sedimentation rate   Collection Time: 06/10/24 11:54 AM  Result Value Ref Range   Sed Rate 26 0 - 30 mm/hr  25-Hydroxy vitamin D Lcms D2+D3   Collection Time: 06/10/24 11:54 AM  Result Value Ref Range   25-Hydroxy, Vitamin D 30 ng/mL   25-Hydroxy, Vitamin D-2 <1.0 ng/mL   25-Hydroxy, Vitamin D-3 30 ng/mL  C-reactive protein   Collection Time: 06/10/24 11:54 AM  Result Value Ref Range   CRP 5 0 - 10 mg/L  Compliance Drug Analysis, Ur   Collection Time: 06/10/24 12:07 PM  Result Value Ref Range   Summary FINAL       Assessment & Plan:   Problem List Items Addressed This Visit       Other   Tobacco use disorder   Would qualify for LDCT- referral placed today.       Relevant Orders   Ambulatory Referral Lung Cancer Screening [facility]   Other Visit Diagnoses       Routine general medical examination at a health care facility    -  Primary   Vaccines up to date/ will return. Screening labs checked today. Will do cologuard at home. Continue diet and exercise. Call with any concerns.   Relevant Orders   Comprehensive metabolic panel with GFR   CBC with Differential/Platelet   Lipid Panel w/o Chol/HDL Ratio   PSA   TSH   Hepatitis B surface antibody,quantitative   Bayer DCA Hb A1c Waived     Snoring       Will get him set up for sleep study. Await results.   Relevant Orders   Ambulatory referral to Sleep Studies     Needs flu shot       Will come back and get it with his wife. Ordered today.   Relevant Orders   Flu vaccine trivalent PF, 6mos and  older(Flulaval,Afluria,Fluarix,Fluzone)     Need for COVID-19 vaccine       Will come back and get it with his wife. Ordered today.   Relevant Orders   Pfizer Comirnaty Covid -19  Vaccine 84yrs and older        LABORATORY TESTING:  Health maintenance labs ordered today as discussed above.   The natural history of prostate cancer and ongoing controversy regarding screening and potential treatment outcomes of prostate cancer has been discussed with the patient. The meaning of a false positive PSA and a false negative PSA has been discussed. He indicates understanding of the limitations of this screening test and wishes to proceed with screening PSA testing.   IMMUNIZATIONS:   - Tdap: Tetanus vaccination status reviewed: last tetanus booster within 10 years. - Influenza: Will come back- ordered - Pneumovax: Up to date - Prevnar: Up to date - COVID: Will come back- ordered - HPV: Not applicable - Shingrix  vaccine: Up to date  SCREENING: - Colonoscopy: has it at home  Discussed with patient purpose of the colonoscopy is to detect colon cancer at curable precancerous or early stages    PATIENT COUNSELING:    Sexuality: Discussed sexually transmitted diseases, partner selection, use of condoms, avoidance of unintended pregnancy  and contraceptive alternatives.   Advised to avoid cigarette smoking.  I discussed with the patient that most people either abstain from alcohol or drink within safe limits (<=14/week and <=4 drinks/occasion for males, <=7/weeks and <= 3 drinks/occasion for females) and that the risk for alcohol disorders and other health effects rises proportionally with the number of drinks per week and how often a drinker exceeds daily limits.  Discussed cessation/primary prevention of drug use and availability of treatment for abuse.   Diet: Encouraged to adjust caloric intake to maintain  or achieve ideal body weight, to reduce intake of dietary saturated fat and total  fat, to limit sodium intake by avoiding high sodium foods and not adding table salt, and to maintain adequate dietary potassium and calcium preferably from fresh fruits, vegetables, and low-fat dairy products.    stressed the importance of regular exercise  Injury prevention: Discussed safety belts, safety helmets, smoke detector, smoking near bedding or upholstery.   Dental health: Discussed importance of regular tooth brushing, flossing, and dental visits.   Follow up plan: NEXT PREVENTATIVE PHYSICAL DUE IN 1 YEAR. Return 2-3 months.

## 2024-07-07 NOTE — Assessment & Plan Note (Signed)
 Would qualify for LDCT- referral placed today.

## 2024-07-08 ENCOUNTER — Other Ambulatory Visit: Payer: Self-pay

## 2024-07-08 LAB — COMPREHENSIVE METABOLIC PANEL WITH GFR
ALT: 27 IU/L (ref 0–44)
AST: 26 IU/L (ref 0–40)
Albumin: 4.3 g/dL (ref 3.8–4.9)
Alkaline Phosphatase: 116 IU/L (ref 47–123)
BUN/Creatinine Ratio: 11 (ref 9–20)
BUN: 9 mg/dL (ref 6–24)
Bilirubin Total: 0.3 mg/dL (ref 0.0–1.2)
CO2: 23 mmol/L (ref 20–29)
Calcium: 9.6 mg/dL (ref 8.7–10.2)
Chloride: 99 mmol/L (ref 96–106)
Creatinine, Ser: 0.79 mg/dL (ref 0.76–1.27)
Globulin, Total: 2.6 g/dL (ref 1.5–4.5)
Glucose: 104 mg/dL — ABNORMAL HIGH (ref 70–99)
Potassium: 4.4 mmol/L (ref 3.5–5.2)
Sodium: 138 mmol/L (ref 134–144)
Total Protein: 6.9 g/dL (ref 6.0–8.5)
eGFR: 102 mL/min/1.73 (ref >59)

## 2024-07-08 LAB — CBC WITH DIFFERENTIAL/PLATELET
Basophils Absolute: 0 x10E3/uL (ref 0.0–0.2)
Basos: 0 %
EOS (ABSOLUTE): 0.2 x10E3/uL (ref 0.0–0.4)
Eos: 2 %
Hematocrit: 47.8 % (ref 37.5–51.0)
Hemoglobin: 16.3 g/dL (ref 13.0–17.7)
Immature Grans (Abs): 0 x10E3/uL (ref 0.0–0.1)
Immature Granulocytes: 0 %
Lymphocytes Absolute: 2.5 x10E3/uL (ref 0.7–3.1)
Lymphs: 29 %
MCH: 32.3 pg (ref 26.6–33.0)
MCHC: 34.1 g/dL (ref 31.5–35.7)
MCV: 95 fL (ref 79–97)
Monocytes Absolute: 0.6 x10E3/uL (ref 0.1–0.9)
Monocytes: 7 %
Neutrophils Absolute: 5.4 x10E3/uL (ref 1.4–7.0)
Neutrophils: 62 %
Platelets: 321 x10E3/uL (ref 150–450)
RBC: 5.04 x10E6/uL (ref 4.14–5.80)
RDW: 12.8 % (ref 11.6–15.4)
WBC: 8.8 x10E3/uL (ref 3.4–10.8)

## 2024-07-08 LAB — LIPID PANEL W/O CHOL/HDL RATIO
Cholesterol, Total: 200 mg/dL — ABNORMAL HIGH (ref 100–199)
HDL: 38 mg/dL — ABNORMAL LOW (ref >39)
LDL Chol Calc (NIH): 141 mg/dL — ABNORMAL HIGH (ref 0–99)
Triglycerides: 115 mg/dL (ref 0–149)
VLDL Cholesterol Cal: 21 mg/dL (ref 5–40)

## 2024-07-08 LAB — PSA: Prostate Specific Ag, Serum: 0.9 ng/mL (ref 0.0–4.0)

## 2024-07-08 LAB — HEPATITIS B SURFACE ANTIBODY, QUANTITATIVE: Hepatitis B Surf Ab Quant: <3.5 m[IU]/mL — ABNORMAL LOW

## 2024-07-08 LAB — TSH: TSH: 1.56 u[IU]/mL (ref 0.450–4.500)

## 2024-07-10 ENCOUNTER — Ambulatory Visit: Payer: Self-pay | Admitting: Family Medicine

## 2024-07-13 ENCOUNTER — Other Ambulatory Visit: Payer: Self-pay

## 2024-07-13 ENCOUNTER — Ambulatory Visit (INDEPENDENT_AMBULATORY_CARE_PROVIDER_SITE_OTHER)

## 2024-07-13 DIAGNOSIS — Z23 Encounter for immunization: Secondary | ICD-10-CM | POA: Diagnosis not present

## 2024-07-13 NOTE — Progress Notes (Signed)
 Patient is in office today for a nurse visit for both a Flu Immunization and Covid Immunization. Flu immunization given to the left deltoid and Covid immunization given in the right deltoid. Patient tolerated well with no concerns.

## 2024-07-15 ENCOUNTER — Telehealth: Payer: Self-pay | Admitting: Acute Care

## 2024-07-15 DIAGNOSIS — Z87891 Personal history of nicotine dependence: Secondary | ICD-10-CM

## 2024-07-15 DIAGNOSIS — Z122 Encounter for screening for malignant neoplasm of respiratory organs: Secondary | ICD-10-CM

## 2024-07-15 DIAGNOSIS — F17210 Nicotine dependence, cigarettes, uncomplicated: Secondary | ICD-10-CM

## 2024-07-15 NOTE — Telephone Encounter (Signed)
 Lung Cancer Screening Narrative/Criteria Questionnaire (Cigarette Smokers Only- No Cigars/Pipes/vapes)   Fred Tran   SDMV:07/22/24 at 1030a/[facility]                                           [birth year]               LDCT: 07/23/24 at 10a/[facility]    58 y.o.   Phone: [phone]  Lung Screening Narrative (confirm age 67-77 yrs Medicare / 50-80 yrs Private pay insurance)   Insurance information:[employer] and medicaid   Referring Provider:[facility]   This screening involves an initial phone call with a team member from our program. It is called a shared decision making visit. The initial meeting is required by insurance and Medicare to make sure you understand the program. This appointment takes about 15-20 minutes to complete. The CT scan will completed at a separate date/time. This scan takes about 5-10 minutes to complete and you may eat and drink before and after the scan.  Criteria questions for Lung Cancer Screening:   Are you a current or former smoker? Current Age began smoking: 14y   If you are a former smoker, what year did you quit smoking? NA   To calculate your smoking history, I need an accurate estimate of how many packs of cigarettes you smoked per day and for how many years. (Not just the number of PPD you are now smoking)   Years smoking 44 x Packs per day 1-2 ppd = Pack years 66   (at least 20 pack yrs)   (Make sure they understand that we need to know how much they have smoked in the past, not just the number of PPD they are smoking now)  Do you have a personal history of cancer?  No    Do you have a family history of cancer? No  Are you coughing up blood?  No  Have you had unexplained weight loss of 15 lbs or more in the last 6 months? No  It looks like you meet all criteria.     Additional information: N/A

## 2024-07-17 ENCOUNTER — Other Ambulatory Visit: Payer: Self-pay

## 2024-07-22 ENCOUNTER — Encounter: Payer: Self-pay | Admitting: Adult Health

## 2024-07-22 ENCOUNTER — Ambulatory Visit (INDEPENDENT_AMBULATORY_CARE_PROVIDER_SITE_OTHER): Admitting: Adult Health

## 2024-07-22 DIAGNOSIS — Z419 Encounter for procedure for purposes other than remedying health state, unspecified: Secondary | ICD-10-CM | POA: Diagnosis not present

## 2024-07-22 DIAGNOSIS — F17210 Nicotine dependence, cigarettes, uncomplicated: Secondary | ICD-10-CM | POA: Diagnosis not present

## 2024-07-22 NOTE — Progress Notes (Signed)
  Virtual Visit via Telephone Note  I connected with Fred Tran , 07/22/24 10:21 AM by a telemedicine application and verified that I am speaking with the correct person using two identifiers.  Location: Patient: home Provider: home   I discussed the limitations of evaluation and management by telemedicine and the availability of in person appointments. The patient expressed understanding and agreed to proceed.   Shared Decision Making Visit Lung Cancer Screening Program [phone])   Eligibility: 58 y.o. Pack Years Smoking History Calculation =66 pack years  (# packs/per year x # years smoked) Recent History of coughing up blood  no Unexplained weight loss? no ( >Than 15 pounds within the last 6 months ) Prior History Lung / other cancer no (Diagnosis within the last 5 years already requiring surveillance chest CT Scans). Smoking Status Current Smoker   Visit Components: Discussion included one or more decision making aids. YES Discussion included risk/benefits of screening. YES Discussion included potential follow up diagnostic testing for abnormal scans. YES Discussion included meaning and risk of over diagnosis. YES Discussion included meaning and risk of False Positives. YES Discussion included meaning of total radiation exposure. YES  Counseling Included: Importance of adherence to annual lung cancer LDCT screening. YES Impact of comorbidities on ability to participate in the program. YES Ability and willingness to under diagnostic treatment. YES  Smoking Cessation Counseling: Current Smokers:  Discussed importance of smoking cessation. yes Information about tobacco cessation classes and interventions provided to patient. yes Patient provided with ticket for LDCT Scan. yes Symptomatic Patient. NO Diagnosis Code: Tobacco Use Z72.0 Asymptomatic Patient yes  Counseling - 4 minutes of smoking cessation counseling (CT Chest Lung Cancer Screening Low Dose W/O CM)  [id number]  Smoking/Tobacco Cessation Counseling [name] is a current user of tobacco or nicotine products. He is considering quitting at this time. Counseling provided today addressed the risks of continued use and the benefits of cessation. Discussed tobacco/nicotine use history, readiness to quit, and evidence-based treatment options including behavioral strategies, support resources, and pharmacologic therapies. Provided encouragement and educational materials on steps and resources to quit smoking. Patient questions were addressed, and follow-up recommended for continued support. Total time spent on counseling: 3 minutes.    Z12.2-Screening of respiratory organs Z87.891-Personal history of nicotine dependence   [name] 07/22/24

## 2024-07-22 NOTE — Patient Instructions (Signed)

## 2024-07-23 ENCOUNTER — Ambulatory Visit
Admission: RE | Admit: 2024-07-23 | Discharge: 2024-07-23 | Disposition: A | Discharge status: Home / Self Care | Source: Ambulatory Visit | Attending: Acute Care | Admitting: Acute Care

## 2024-07-23 DIAGNOSIS — Z87891 Personal history of nicotine dependence: Secondary | ICD-10-CM | POA: Insufficient documentation

## 2024-07-23 DIAGNOSIS — F17210 Nicotine dependence, cigarettes, uncomplicated: Secondary | ICD-10-CM | POA: Diagnosis not present

## 2024-07-23 DIAGNOSIS — Z122 Encounter for screening for malignant neoplasm of respiratory organs: Secondary | ICD-10-CM | POA: Diagnosis present

## 2024-07-30 ENCOUNTER — Ambulatory Visit: Admitting: Nurse Practitioner

## 2024-07-30 ENCOUNTER — Other Ambulatory Visit: Payer: Self-pay

## 2024-07-30 ENCOUNTER — Encounter: Payer: Self-pay | Admitting: Nurse Practitioner

## 2024-07-30 ENCOUNTER — Ambulatory Visit: Payer: Self-pay

## 2024-07-30 VITALS — BP 135/79 | HR 78 | Temp 97.0°F | Ht 72.01 in | Wt 277.8 lb

## 2024-07-30 DIAGNOSIS — J0110 Acute frontal sinusitis, unspecified: Secondary | ICD-10-CM | POA: Diagnosis not present

## 2024-07-30 MED ORDER — AMOXICILLIN-POT CLAVULANATE 875-125 MG PO TABS
1.0000 | ORAL_TABLET | Freq: Two times a day (BID) | ORAL | 0 refills | Status: DC
  Filled 2024-07-30: qty 20, 10d supply, fill #0

## 2024-07-30 NOTE — Progress Notes (Signed)
 BP 135/79 (BP Location: Left Arm, Patient Position: Sitting, Cuff Size: Large)   Pulse 78   Temp (!) 97 F (36.1 C) (Oral)   Ht 6' 0.01 (1.829 m)   Wt 277 lb 12.8 oz (126 kg)   BMI 37.67 kg/m    Subjective:    Patient ID: Fred Tran, male    DOB: 1966/08/24, 57 y.o.   MRN: [id number]  HPI: DONATELLO Tran is a 58 y.o. male  Chief Complaint  Patient presents with   Sinus Problem    Patient states it's been ongoing since last week with sinus issues and ear pain. He has taken anything for it as of now.   UPPER RESPIRATORY TRACT INFECTION Worst symptom: symptoms started about a week ago Fever: no Cough: yes Shortness of breath: no Wheezing: no Chest pain: no Chest tightness: no Chest congestion: no Nasal congestion: yes Runny nose: no Post nasal drip: no Sneezing: no Sore throat: no Swollen glands: no Sinus pressure: yes Headache: yes Face pain: yes Toothache: no Ear pain: yes left Ear pressure: yes bilateral Eyes red/itching:no Eye drainage/crusting: no  Vomiting: no Rash: no Fatigue: yes Sick contacts: no Strep contacts: no  Context: stable Recurrent sinusitis: no Relief with OTC cold/cough medications: no  Treatments attempted: none   Relevant past medical, surgical, family and social history reviewed and updated as indicated. Interim medical history since our last visit reviewed. Allergies and medications reviewed and updated.  Review of Systems  Constitutional:  Positive for fatigue. Negative for fever.  HENT:  Positive for congestion. Negative for ear pain, postnasal drip, rhinorrhea, sinus pressure, sinus pain, sneezing and sore throat.   Respiratory:  Positive for cough. Negative for chest tightness, shortness of breath and wheezing.   Gastrointestinal:  Negative for vomiting.  Skin:  Negative for rash.  Neurological:  Positive for headaches.    Per HPI unless specifically indicated above     Objective:    BP 135/79 (BP Location:  Left Arm, Patient Position: Sitting, Cuff Size: Large)   Pulse 78   Temp (!) 97 F (36.1 C) (Oral)   Ht 6' 0.01 (1.829 m)   Wt 277 lb 12.8 oz (126 kg)   BMI 37.67 kg/m   Wt Readings from Last 3 Encounters:  07/30/24 277 lb 12.8 oz (126 kg)  07/23/24 275 lb (124.7 kg)  07/07/24 276 lb 9.6 oz (125.5 kg)    Physical Exam Vitals and nursing note reviewed.  Constitutional:      General: He is not in acute distress.    Appearance: Normal appearance. He is not ill-appearing, toxic-appearing or diaphoretic.  HENT:     Head: Normocephalic.     Right Ear: External ear normal. A middle ear effusion is present.     Left Ear: External ear normal. A middle ear effusion is present.     Nose: Congestion and rhinorrhea present.     Right Sinus: Frontal sinus tenderness present.     Left Sinus: Frontal sinus tenderness present.     Mouth/Throat:     Mouth: Mucous membranes are moist.     Pharynx: Posterior oropharyngeal erythema present.  Eyes:     General:        Right eye: No discharge.        Left eye: No discharge.     Extraocular Movements: Extraocular movements intact.     Conjunctiva/sclera: Conjunctivae normal.     Pupils: Pupils are equal, round, and reactive to light.  Cardiovascular:  Rate and Rhythm: Normal rate and regular rhythm.     Heart sounds: No murmur heard. Pulmonary:     Effort: Pulmonary effort is normal. No respiratory distress.     Breath sounds: Normal breath sounds. No wheezing, rhonchi or rales.  Abdominal:     General: Abdomen is flat. Bowel sounds are normal.  Musculoskeletal:     Cervical back: Normal range of motion and neck supple.  Skin:    General: Skin is warm and dry.     Capillary Refill: Capillary refill takes less than 2 seconds.  Neurological:     General: No focal deficit present.     Mental Status: He is alert and oriented to person, place, and time.  Psychiatric:        Mood and Affect: Mood normal.        Behavior: Behavior normal.         Thought Content: Thought content normal.        Judgment: Judgment normal.     Results for orders placed or performed in visit on 07/07/24  Bayer DCA Hb A1c Waived   Collection Time: 07/07/24  3:03 PM  Result Value Ref Range   HB A1C (BAYER DCA - WAIVED) 5.8 (H) 4.8 - 5.6 %  Comprehensive metabolic panel with GFR   Collection Time: 07/07/24  3:04 PM  Result Value Ref Range   Glucose 104 (H) 70 - 99 mg/dL   BUN 9 6 - 24 mg/dL   Creatinine, Ser 9.20 0.76 - 1.27 mg/dL   eGFR 897 >40 fO/fpw/8.26   BUN/Creatinine Ratio 11 9 - 20   Sodium 138 134 - 144 mmol/L   Potassium 4.4 3.5 - 5.2 mmol/L   Chloride 99 96 - 106 mmol/L   CO2 23 20 - 29 mmol/L   Calcium 9.6 8.7 - 10.2 mg/dL   Total Protein 6.9 6.0 - 8.5 g/dL   Albumin 4.3 3.8 - 4.9 g/dL   Globulin, Total 2.6 1.5 - 4.5 g/dL   Bilirubin Total 0.3 0.0 - 1.2 mg/dL   Alkaline Phosphatase 116 47 - 123 IU/L   AST 26 0 - 40 IU/L   ALT 27 0 - 44 IU/L  CBC with Differential/Platelet   Collection Time: 07/07/24  3:04 PM  Result Value Ref Range   WBC 8.8 3.4 - 10.8 x10E3/uL   RBC 5.04 4.14 - 5.80 x10E6/uL   Hemoglobin 16.3 13.0 - 17.7 g/dL   Hematocrit 52.1 62.4 - 51.0 %   MCV 95 79 - 97 fL   MCH 32.3 26.6 - 33.0 pg   MCHC 34.1 31.5 - 35.7 g/dL   RDW 87.1 88.3 - 84.5 %   Platelets 321 150 - 450 x10E3/uL   Neutrophils 62 Not Estab. %   Lymphs 29 Not Estab. %   Monocytes 7 Not Estab. %   Eos 2 Not Estab. %   Basos 0 Not Estab. %   Neutrophils Absolute 5.4 1.4 - 7.0 x10E3/uL   Lymphocytes Absolute 2.5 0.7 - 3.1 x10E3/uL   Monocytes Absolute 0.6 0.1 - 0.9 x10E3/uL   EOS (ABSOLUTE) 0.2 0.0 - 0.4 x10E3/uL   Basophils Absolute 0.0 0.0 - 0.2 x10E3/uL   Immature Granulocytes 0 Not Estab. %   Immature Grans (Abs) 0.0 0.0 - 0.1 x10E3/uL  Lipid Panel w/o Chol/HDL Ratio   Collection Time: 07/07/24  3:04 PM  Result Value Ref Range   Cholesterol, Total 200 (H) 100 - 199 mg/dL   Triglycerides 884 0 - 149  mg/dL   HDL 38 (L) >60 mg/dL    VLDL Cholesterol Cal 21 5 - 40 mg/dL   LDL Chol Calc (NIH) 858 (H) 0 - 99 mg/dL  PSA   Collection Time: 07/07/24  3:04 PM  Result Value Ref Range   Prostate Specific Ag, Serum 0.9 0.0 - 4.0 ng/mL  TSH   Collection Time: 07/07/24  3:04 PM  Result Value Ref Range   TSH 1.560 0.450 - 4.500 uIU/mL  Hepatitis B surface antibody,quantitative   Collection Time: 07/07/24  3:04 PM  Result Value Ref Range   Hepatitis B Surf Ab Quant <3.5 (L) Immunity>10 mIU/mL      Assessment & Plan:   Problem List Items Addressed This Visit   None Visit Diagnoses       Acute non-recurrent frontal sinusitis    -  Primary   Will treat with augmentin . Complete course of medication.  Okay to use OTC symptom management follow up if symptoms worsen or fail to improve.   Relevant Medications   amoxicillin -clavulanate (AUGMENTIN ) 875-125 MG tablet        Follow up plan: Return if symptoms worsen or fail to improve.

## 2024-07-30 NOTE — Telephone Encounter (Signed)
 FYI Only or Action Required?: FYI only for provider: appointment scheduled on appt this morning.  Patient was last seen in primary care on 07/07/2024 by Vicci Bouchard P, DO.  Called Nurse Triage reporting Sinusitis. And ear pain  Symptoms began a week ago. Ear pain started yesterday  Interventions attempted: Nothing.  Symptoms are: gradually worsening.  Triage Disposition: See Physician Within 24 Hours  Patient/caregiver understands and will follow disposition?: Yes                          Copied from CRM #[id number]. Topic: Clinical - Red Word Triage >> Jul 30, 2024  8:17 AM Fred Tran wrote: Red Word that prompted transfer to Nurse Triage: Possible sinus infection .Fred Tran Earache, stopped up nose, cough mucus (yellow green) Reason for Disposition  Earache  Answer Assessment - Initial Assessment Questions 1. LOCATION: Where does it hurt?      Sinus pain and pressure, Ear pain 2. ONSET: When did the sinus pain start?  (e.g., hours, days)      1 week 3. SEVERITY: How bad is the pain?   (Scale 0-10; or none, mild, moderate or severe)     moderate 4. RECURRENT SYMPTOM: Have you ever had sinus problems before? If Yes, ask: When was the last time? and What happened that time?      yes 5. NASAL CONGESTION: Is the nose blocked? If Yes, ask: Can you open it or must you breathe through your mouth?     yes 6. NASAL DISCHARGE: Do you have discharge from your nose? If so ask, What color?     Yellow green 7. FEVER: Do you have a fever? If Yes, ask: What is it, how was it measured, and when did it start?      no 8. OTHER SYMPTOMS: Do you have any other symptoms? (e.g., sore throat, cough, earache, difficulty breathing)     Ear pain  Protocols used: Sinus Pain or Congestion-A-AH

## 2024-07-31 ENCOUNTER — Other Ambulatory Visit: Payer: Self-pay

## 2024-07-31 ENCOUNTER — Ambulatory Visit

## 2024-07-31 DIAGNOSIS — Z122 Encounter for screening for malignant neoplasm of respiratory organs: Secondary | ICD-10-CM

## 2024-07-31 DIAGNOSIS — F17210 Nicotine dependence, cigarettes, uncomplicated: Secondary | ICD-10-CM

## 2024-07-31 DIAGNOSIS — Z87891 Personal history of nicotine dependence: Secondary | ICD-10-CM

## 2024-08-04 ENCOUNTER — Ambulatory Visit (INDEPENDENT_AMBULATORY_CARE_PROVIDER_SITE_OTHER)

## 2024-08-04 DIAGNOSIS — Z23 Encounter for immunization: Secondary | ICD-10-CM

## 2024-08-04 NOTE — Progress Notes (Signed)
 Patient is in office today for a nurse visit for Hepatitis B Immunization. Patient Injection was given in the  Right deltoid. Patient tolerated injection well.

## 2024-08-11 ENCOUNTER — Other Ambulatory Visit: Payer: Self-pay

## 2024-08-29 ENCOUNTER — Other Ambulatory Visit: Payer: Self-pay

## 2024-08-29 DIAGNOSIS — Z03818 Encounter for observation for suspected exposure to other biological agents ruled out: Secondary | ICD-10-CM | POA: Diagnosis not present

## 2024-08-29 DIAGNOSIS — R062 Wheezing: Secondary | ICD-10-CM | POA: Diagnosis not present

## 2024-08-29 DIAGNOSIS — J101 Influenza due to other identified influenza virus with other respiratory manifestations: Secondary | ICD-10-CM | POA: Diagnosis not present

## 2024-08-29 MED ORDER — ALBUTEROL SULFATE HFA 108 (90 BASE) MCG/ACT IN AERS
INHALATION_SPRAY | RESPIRATORY_TRACT | 0 refills | Status: AC
  Filled 2024-08-29: qty 18, 20d supply, fill #0

## 2024-08-29 MED ORDER — EASIVENT MISC: 2 refills | Status: AC

## 2024-08-29 MED ORDER — IBUPROFEN 600 MG PO TABS
ORAL_TABLET | ORAL | 0 refills | Status: DC
  Filled 2024-08-29: qty 28, 7d supply, fill #0

## 2024-08-29 MED ORDER — FLUTICASONE PROPIONATE 50 MCG/ACT NA SUSP
NASAL | 0 refills | Status: AC
  Filled 2024-08-29: qty 16, 30d supply, fill #0

## 2024-08-29 MED ORDER — OSELTAMIVIR PHOSPHATE 75 MG PO CAPS
ORAL_CAPSULE | ORAL | 0 refills | Status: DC
  Filled 2024-08-29: qty 10, 5d supply, fill #0

## 2024-08-29 MED ORDER — PREDNISONE 20 MG PO TABS
ORAL_TABLET | ORAL | 0 refills | Status: DC
  Filled 2024-08-29: qty 10, 5d supply, fill #0

## 2024-08-29 NOTE — Progress Notes (Signed)
 " HPI  SUBJECTIVE:   History of Present Illness Fred Tran is a 58 year old male who presents with cough and nasal congestion.  He developed cough and nasal congestion about two days ago. He notes sweating after taking Alka-Seltzer Plus but has not measured a temperature above 100.16F. He has occasional clear sputum. He denies body aches, headache, sinus pain or pressure, sore throat, postnasal drip, wheezing, chest pain, or shortness of breath, including with exertion.  He had nausea today, which he associates with not eating. His daughter and possibly his wife currently have influenza.  He got the flu vaccine this year.  He has used Alka-Seltzer Plus and Tylenol  500 mg liquid for symptom relief. Tylenol  lowers his perceived fever for about four hours.  No antipyretic in the past 6 hours.  He smokes about one pack per day and has done so for 40 to 45 years. He denies asthma, emphysema, or COPD.  He has a BMI of above 30, hypercholesterolemia.  PCP: [facility].  He has an appointment with them on January 2    No past medical history on file.  History reviewed. No pertinent surgical history.  No family history on file.  Social History   Tobacco Use   Smoking status: Every Day    Types: Cigarettes    Passive exposure: Current   Smokeless tobacco: Current  Vaping Use   Vaping status: Never Used  Substance Use Topics   Alcohol use: Not Currently   Drug use: Defer     Current Outpatient Medications:    aspirin 81 MG EC tablet, , Disp: , Rfl:    omeprazole  (PRILOSEC) 20 MG DR capsule, Take 20 mg by mouth once daily, Disp: , Rfl:    albuterol  MDI, PROVENTIL , VENTOLIN , PROAIR , HFA 90 mcg/actuation inhaler, Inhale 2 inhalations into the lungs every 4 (four) hours as needed for Wheezing, Disp: 1 each, Rfl: 0   fluticasone  propionate (FLONASE ) 50 mcg/actuation nasal spray, Place 2 sprays into both nostrils once daily, Disp: 16 g, Rfl: 0   ibuprofen   (MOTRIN ) 600 MG tablet, Take 1 tablet (600 mg total) by mouth every 6 (six) hours as needed for Pain for up to 7 days, Disp: 28 tablet, Rfl: 0   inhalational spacer (AEROCHAMBER) spacer, Please instruct patient on use, Disp: 1 each, Rfl: 2   oseltamivir  (TAMIFLU ) 75 MG capsule, Take 1 capsule (75 mg total) by mouth 2 (two) times daily for 5 days, Disp: 10 capsule, Rfl: 0   predniSONE  (DELTASONE ) 20 MG tablet, Take 2 tablets (40 mg total) by mouth once daily for 5 days, Disp: 10 tablet, Rfl: 0  No Known Allergies   ROS  As noted in HPI.   Physical Exam  BP (!) 148/70   Pulse 88   Temp 37.2 C (99 F) (Oral)   Ht 182.9 cm (6')   Wt (!) 129.7 kg (286 lb)   SpO2 97%   BMI 38.79 kg/m   Constitutional: Well developed, well nourished, no acute distress Eyes: PERRL, EOMI, conjunctiva normal bilaterally HENT: Normocephalic, atraumatic,mucus membranes moist.  Mild nasal congestion.  Normal turns.  No maxillary, frontal sinus tenderness.  Normal oropharynx.  No postnasal drip. Neck: Positive cervical lymphadenopathy Respiratory: Wheezing right side.  Rhonchi right lower lobe.  Fair air movement. Cardiovascular: Normal rate and rhythm, no murmurs, no gallops, no rubs GI: nondistended, skin: No rash, skin intact Musculoskeletal: no deformities Neurologic: Alert & oriented x 3, CN III-XII grossly intact, no motor deficits,  sensation grossly intact Psychiatric: Speech and behavior appropriate  Walk-In Course     Orders Placed This Encounter  Procedures   Extended Respiratory Viral Panel - [facility]    Standing Status:   Future    Number of Occurrences:   1    Expected Date:   08/29/2024    Expiration Date:   12/28/2024    Employed in healthcare setting?:   No    Symptomatic for COVID-19 as defined by CDC?:   Yes    Hospitalized for COVID-19?:   No    Admitted to ICU for COVID-19?:   No    Resident in a congregate (group) care setting?:   No    Release to patient:   Immediate     First COVID-19 test?:   No    Results for orders placed or performed in visit on 08/29/24  Extended Respiratory Viral Panel - [facility]   Specimen: Nasal Swab; Other  Result Value Ref Range   Influenza A PCR Positive (!) Negative   Influenza B PCR Negative Negative   RSV PCR Negative Negative   SARS-CoV2 PCR Negative Negative   Narrative   Positive  Positive results are indicative of the presence of the identified virus, but do not rule out bacterial infection or co-infection with other pathogens not detected by the test. Clinical correlation with patient history and other diagnostic information is necessary to determine patient infection status.  Negative  Negative results do not preclude SARS-CoV-2 infection, Influenza A virus, Influenza B virus and/or RSV infection and should not be used as the sole basis for treatment or other patient management decisions. Negative results must be combined with clinical observations, patient history, and/or epidemiological information.        Walk-In Clinical Impression  1. Influenza A   2. Encounter for observation for suspected exposure to other biological agents ruled out   3. Wheezing      Walk-In Assessment/Plan  Patient consented to the use of AI during this encounter.  Influenza A positive.  COVID, RSV, flu B-.  Discussed all results with patient while in department.  We discussed getting a chest x-ray, but it would not change management.  If he has a pneumonia I expect influenza pneumonia rather than a bacterial one. Home with Tamiflu , regularly scheduled albuterol  inhaler with a spacer for 4 days, then as needed thereafter, prednisone  40 mg for 5 days, Promethazine DM, Tylenol  combined with ibuprofen  together 3-4 times daily as needed, Mucinex D, saline nasal irrigation, discontinue other cold medications, follow-up with PCP as needed.  ER return precautions given.  Discussed labs,  MDM, treatment plan, and plan for follow-up with  patient. Discussed sn/sx that should prompt return to the ED. patient agrees with plan.   Requested Prescriptions   Signed Prescriptions Disp Refills   albuterol  MDI, PROVENTIL , VENTOLIN , PROAIR , HFA 90 mcg/actuation inhaler 1 each 0    Sig: Inhale 2 inhalations into the lungs every 4 (four) hours as needed for Wheezing   fluticasone  propionate (FLONASE ) 50 mcg/actuation nasal spray 16 g 0    Sig: Place 2 sprays into both nostrils once daily   oseltamivir  (TAMIFLU ) 75 MG capsule 10 capsule 0    Sig: Take 1 capsule (75 mg total) by mouth 2 (two) times daily for 5 days   predniSONE  (DELTASONE ) 20 MG tablet 10 tablet 0    Sig: Take 2 tablets (40 mg total) by mouth once daily for 5 days   ibuprofen  (MOTRIN ) 600 MG  tablet 28 tablet 0    Sig: Take 1 tablet (600 mg total) by mouth every 6 (six) hours as needed for Pain for up to 7 days   inhalational spacer (AEROCHAMBER) spacer 1 each 2    Sig: Please instruct patient on use   This note has been created using automated tools and reviewed for accuracy by ASHLEY MCBRIDE MORTENSON.   *This clinic note was created using Dragon dictation software. Therefore, there may be occasional mistakes despite careful proofreading.  Rosina Woodard Shirts, MD  Rosina Shirts, M.D.    "

## 2024-09-04 ENCOUNTER — Other Ambulatory Visit: Payer: Self-pay

## 2024-09-04 ENCOUNTER — Encounter: Payer: Self-pay | Admitting: Student

## 2024-09-04 ENCOUNTER — Ambulatory Visit: Admitting: Student

## 2024-09-04 VITALS — BP 138/60 | HR 78 | Ht 72.01 in | Wt 285.0 lb

## 2024-09-04 DIAGNOSIS — J101 Influenza due to other identified influenza virus with other respiratory manifestations: Secondary | ICD-10-CM | POA: Diagnosis not present

## 2024-09-04 DIAGNOSIS — H66003 Acute suppurative otitis media without spontaneous rupture of ear drum, bilateral: Secondary | ICD-10-CM

## 2024-09-04 MED ORDER — AMOXICILLIN-POT CLAVULANATE 875-125 MG PO TABS
1.0000 | ORAL_TABLET | Freq: Two times a day (BID) | ORAL | 0 refills | Status: DC
  Filled 2024-09-04: qty 14, 7d supply, fill #0

## 2024-09-04 MED ORDER — BENZONATATE 100 MG PO CAPS
100.0000 mg | ORAL_CAPSULE | Freq: Two times a day (BID) | ORAL | 0 refills | Status: DC | PRN
  Filled 2024-09-04: qty 20, 10d supply, fill #0

## 2024-09-04 NOTE — Progress Notes (Signed)
 "  Established Patient Office Visit  Subjective   Patient ID: Fred Tran, male    DOB: 1966/05/01  Age: 58 y.o. MRN: [id number]  Chief Complaint  Patient presents with   Ear Pain    Bil ear pain R>L Right ear is draining. Pain and drainage started yesterday.     Fred Tran is a 58 y.o. person with medical hx listed below who presents today for bilateral ear pain that started yesterday. R ear is painful mild left ear feels full as well. Feels hearing is worse due to this. Right ear is draining [place] os yellow liquid.  Denies fevers, chills, dyspnea, chest pain, ringing in the ears, or head trauma.   Patient Active Problem List   Diagnosis Date Noted   Chronic pain syndrome 06/10/2024   Pharmacologic therapy 06/10/2024   Disorder of skeletal system 06/10/2024   Problems influencing health status 06/10/2024   Grade 1 Retrolisthesis of L2/L3 06/10/2024   Abnormal MRI (cervical/thoracic/lumbar) (07/23/2022) ([facility]) (incomplete) 06/10/2024   Failed back surgical syndrome 06/10/2024   Chronic low back pain (1ry area of Pain) (Bilateral) w/o sciatica 06/10/2024   Chronic lower extremity pain (Left) 06/10/2024   Chronic hip pain (Left) 06/10/2024   Chronic knee pain (Left) 06/10/2024   History of lumbar spinal fusion 06/10/2024   Low back pain of over 3 months duration 06/10/2024   Low back pain radiating to left leg 06/10/2024   Multifactorial low back pain 06/10/2024   Fixation hardware in spine 06/10/2024   Lumbosacral radiculopathy at L5 (Left) 06/10/2024   Chronic shoulder pain (Bilateral) 06/10/2024   Obesity, morbid (HCC) 09/24/2023   Lumbar radiculopathy 12/08/2021   Obesity (BMI 35.0-39.9 without comorbidity) 09/29/2021   Hyperlipidemia 09/29/2021   Tobacco use disorder 04/18/2021   Lumbar spinal stenosis 04/13/2021   Other intervertebral disc degeneration, lumbar region 10/26/2020   Adjustment disorder with mixed anxiety and depressed mood 04/03/2019       ROS Refer to HPI    Objective:     Outpatient Encounter Medications as of 09/04/2024  Medication Sig   albuterol  (VENTOLIN  HFA) 108 (90 Base) MCG/ACT inhaler Inhale 2 puffs into the lungs every 4 (four) hours as needed for Wheezing   amoxicillin -clavulanate (AUGMENTIN ) 875-125 MG tablet Take 1 tablet by mouth 2 (two) times daily for 7 days.   baclofen  (LIORESAL ) 20 MG tablet Take 20 mg by mouth 1 day or 1 dose.   benzonatate  (TESSALON ) 100 MG capsule Take 1 capsule (100 mg total) by mouth 2 (two) times daily as needed for cough.   fexofenadine  (ALLEGRA  ALLERGY) 180 MG tablet Take 1 tablet (180 mg total) by mouth daily.   fluticasone  (FLONASE ) 50 MCG/ACT nasal spray Place 2 sprays into both nostrils once daily   ibuprofen  (ADVIL ) 600 MG tablet Take 1 tablet (600 mg total) by mouth every 6 (six) hours as needed for moderate pain (pain score 4-6) or mild pain (pain score 1-3).   ibuprofen  (ADVIL ) 600 MG tablet Take 1 tablet (600 mg total) by mouth every 6 (six) hours as needed for Pain for up to 7 days   omeprazole  (PRILOSEC) 20 MG capsule Take 1 capsule (20 mg total) by mouth daily.   Spacer/Aero-Holding Chambers (EASIVENT) inhaler Please instruct patient on use   oseltamivir  (TAMIFLU ) 75 MG capsule Take 1 capsule (75 mg total) by mouth 2 (two) times daily for 5 days   predniSONE  (DELTASONE ) 20 MG tablet Take 2 tablets (40 mg total) by mouth once  daily for 5 days   [DISCONTINUED] amoxicillin -clavulanate (AUGMENTIN ) 875-125 MG tablet Take 1 tablet by mouth 2 (two) times daily.   No facility-administered encounter medications on file as of 09/04/2024.    BP 138/60   Pulse 78   Ht 6' 0.01 (1.829 m)   Wt 285 lb (129.3 kg)   SpO2 98%   BMI 38.64 kg/m  BP Readings from Last 3 Encounters:  09/04/24 138/60  07/30/24 135/79  07/07/24 (!) 147/77    Physical Exam Constitutional:      Appearance: Normal appearance.  HENT:     Right Ear: A middle ear effusion is present. Tympanic  membrane is erythematous and bulging. Tympanic membrane is not perforated.     Left Ear: Tympanic membrane is erythematous and bulging. Tympanic membrane is not perforated.     Nose:     Right Turbinates: Not enlarged or swollen.     Left Turbinates: Not enlarged or swollen.     Mouth/Throat:     Mouth: Mucous membranes are moist.     Pharynx: Oropharynx is clear.     Tonsils: No tonsillar exudate or tonsillar abscesses.  Cardiovascular:     Rate and Rhythm: Normal rate and regular rhythm.  Pulmonary:     Effort: Pulmonary effort is normal. No respiratory distress.     Breath sounds: Normal breath sounds. No rhonchi or rales.  Abdominal:     General: Abdomen is flat. Bowel sounds are normal. There is no distension.     Palpations: Abdomen is soft.     Tenderness: There is no abdominal tenderness.  Musculoskeletal:        General: Normal range of motion.     Right lower leg: No edema.     Left lower leg: No edema.  Skin:    General: Skin is warm and dry.     Capillary Refill: Capillary refill takes less than 2 seconds.  Neurological:     General: No focal deficit present.     Mental Status: He is alert and oriented to person, place, and time.  Psychiatric:        Mood and Affect: Mood normal.        Behavior: Behavior normal.        09/04/2024    1:23 PM 07/30/2024   10:54 AM 06/10/2024   11:07 AM  Depression screen PHQ 2/9  Decreased Interest 0 0 0  Down, Depressed, Hopeless 0 0 1  PHQ - 2 Score 0 0 1  Altered sleeping  0   Tired, decreased energy  0   Change in appetite  0   Feeling bad or failure about yourself   0   Trouble concentrating  0   Moving slowly or fidgety/restless  0   Suicidal thoughts  0   PHQ-9 Score  0   Difficult doing work/chores  Not difficult at all        07/30/2024   10:55 AM 12/02/2023    4:21 PM 04/16/2023    9:51 AM 07/16/2022    9:15 AM  GAD 7 : Generalized Anxiety Score  Nervous, Anxious, on Edge 0 0 0 0  Control/stop worrying 0 0  0 1  Worry too much - different things 0 0 0 0  Trouble relaxing 0 0 0 0  Restless 0 0 0 0  Easily annoyed or irritable 0 0 0 1  Afraid - awful might happen 0 0 0 0  Total GAD 7 Score 0 0 0  2  Anxiety Difficulty Not difficult at all Not difficult at all  Not difficult at all    No results found for any visits on 09/04/24.    The 10-year ASCVD risk score (Arnett DK, et al., 2019) is: 17.8%    Assessment & Plan:  Acute suppurative otitis media of both ears without spontaneous rupture of tympanic membranes, recurrence not specified Exam c/w AOM. Treat with Augmentin .  Other orders -     Amoxicillin -Pot Clavulanate; Take 1 tablet by mouth 2 (two) times daily for 7 days.  Dispense: 14 tablet; Refill: 0 -     Benzonatate ; Take 1 capsule (100 mg total) by mouth 2 (two) times daily as needed for cough.  Dispense: 20 capsule; Refill: 0  Influenza A Positive for influenza A on 08/29/2024. Reports symptoms are improving still has lingering cough. Lung exam is reassuring today. Tessalon  Perles for cough.  Other orders -     Amoxicillin -Pot Clavulanate; Take 1 tablet by mouth 2 (two) times daily for 7 days.  Dispense: 14 tablet; Refill: 0 -     Benzonatate ; Take 1 capsule (100 mg total) by mouth 2 (two) times daily as needed for cough.  Dispense: 20 capsule; Refill: 0    Harlene Saddler, MD "

## 2024-09-11 ENCOUNTER — Ambulatory Visit: Admitting: Family Medicine

## 2024-09-11 ENCOUNTER — Other Ambulatory Visit: Payer: Self-pay

## 2024-09-11 ENCOUNTER — Encounter: Payer: Self-pay | Admitting: Family Medicine

## 2024-09-11 VITALS — BP 134/58 | HR 72 | Temp 97.9°F | Ht 72.0 in | Wt 285.4 lb

## 2024-09-11 DIAGNOSIS — R0683 Snoring: Secondary | ICD-10-CM

## 2024-09-11 DIAGNOSIS — H66004 Acute suppurative otitis media without spontaneous rupture of ear drum, recurrent, right ear: Secondary | ICD-10-CM

## 2024-09-11 MED ORDER — PREDNISONE 50 MG PO TABS
50.0000 mg | ORAL_TABLET | Freq: Every day | ORAL | 0 refills | Status: DC
  Filled 2024-09-11: qty 5, 5d supply, fill #0

## 2024-09-11 MED ORDER — DOXYCYCLINE HYCLATE 100 MG PO TABS
100.0000 mg | ORAL_TABLET | Freq: Two times a day (BID) | ORAL | 0 refills | Status: DC
  Filled 2024-09-11: qty 20, 10d supply, fill #0

## 2024-09-11 NOTE — Progress Notes (Signed)
 "  BP (!) 134/58   Pulse 72   Temp 97.9 F (36.6 C) (Oral)   Ht 6' (1.829 m)   Wt 285 lb 6.4 oz (129.5 kg)   SpO2 95%   BMI 38.71 kg/m    Subjective:    Patient ID: Fred Tran, male    DOB: Sep 07, 1966, 59 y.o.   MRN: [id number]  HPI: Fred Tran is a 59 y.o. male  Chief Complaint  Patient presents with   Ear Fullness   Ear Pain    Post flu   EAR PAIN Duration: about 2 weeks Involved ear(s): right Severity:  severe  Quality:  aching and sore Fever: no Otorrhea: no Upper respiratory infection symptoms: no Pruritus: no Hearing loss: yes Water immersion no Using Q-tips: no Recurrent otitis media: yes Status: stable Treatments attempted: augmentin   ????SLEEP APNEA Sleep apnea status: unknown Duration: chronic Satisfied with current treatment?:  no Last sleep study: inconclusive study in November Treatments attempted: none Wakes feeling refreshed:  no Daytime hypersomnolence:  yes Fatigue:  yes Insomnia:  no Good sleep hygiene:  yes Difficulty falling asleep:  no Difficulty staying asleep:  no Snoring bothers bed partner:  yes Observed apnea by bed partner: yes Obesity:  yes Hypertension: yes  Pulmonary hypertension:  no Coronary artery disease:  no   Relevant past medical, surgical, family and social history reviewed and updated as indicated. Interim medical history since our last visit reviewed. Allergies and medications reviewed and updated.  Review of Systems  Constitutional: Negative.   HENT:  Positive for ear pain and hearing loss. Negative for congestion, dental problem, drooling, ear discharge, facial swelling, mouth sores, nosebleeds, postnasal drip, rhinorrhea, sinus pressure, sinus pain, sneezing, sore throat, tinnitus, trouble swallowing and voice change.   Respiratory: Negative.    Cardiovascular: Negative.   Psychiatric/Behavioral: Negative.      Per HPI unless specifically indicated above     Objective:    BP (!) 134/58    Pulse 72   Temp 97.9 F (36.6 C) (Oral)   Ht 6' (1.829 m)   Wt 285 lb 6.4 oz (129.5 kg)   SpO2 95%   BMI 38.71 kg/m   Wt Readings from Last 3 Encounters:  09/11/24 285 lb 6.4 oz (129.5 kg)  09/04/24 285 lb (129.3 kg)  07/30/24 277 lb 12.8 oz (126 kg)    Physical Exam Vitals and nursing note reviewed.  Constitutional:      General: He is not in acute distress.    Appearance: Normal appearance. He is obese. He is not ill-appearing, toxic-appearing or diaphoretic.  HENT:     Head: Normocephalic and atraumatic.     Right Ear: External ear normal. Tympanic membrane is erythematous and bulging.     Left Ear: Tympanic membrane, ear canal and external ear normal.     Nose: Nose normal. No congestion or rhinorrhea.     Mouth/Throat:     Mouth: Mucous membranes are moist.     Pharynx: Oropharynx is clear. No oropharyngeal exudate or posterior oropharyngeal erythema.  Eyes:     General: No scleral icterus.       Right eye: No discharge.        Left eye: No discharge.     Extraocular Movements: Extraocular movements intact.     Conjunctiva/sclera: Conjunctivae normal.     Pupils: Pupils are equal, round, and reactive to light.  Cardiovascular:     Rate and Rhythm: Normal rate and regular rhythm.  Pulses: Normal pulses.     Heart sounds: Normal heart sounds. No murmur heard.    No friction rub. No gallop.  Pulmonary:     Effort: Pulmonary effort is normal. No respiratory distress.     Breath sounds: Normal breath sounds. No stridor. No wheezing, rhonchi or rales.  Chest:     Chest wall: No tenderness.  Musculoskeletal:        General: Normal range of motion.     Cervical back: Normal range of motion and neck supple.  Skin:    General: Skin is warm and dry.     Capillary Refill: Capillary refill takes less than 2 seconds.     Coloration: Skin is not jaundiced or pale.     Findings: No bruising, erythema, lesion or rash.  Neurological:     General: No focal deficit present.      Mental Status: He is alert and oriented to person, place, and time. Mental status is at baseline.  Psychiatric:        Mood and Affect: Mood normal.        Behavior: Behavior normal.        Thought Content: Thought content normal.        Judgment: Judgment normal.     Results for orders placed or performed in visit on 07/07/24  Bayer DCA Hb A1c Waived   Collection Time: 07/07/24  3:03 PM  Result Value Ref Range   HB A1C (BAYER DCA - WAIVED) 5.8 (H) 4.8 - 5.6 %  Comprehensive metabolic panel with GFR   Collection Time: 07/07/24  3:04 PM  Result Value Ref Range   Glucose 104 (H) 70 - 99 mg/dL   BUN 9 6 - 24 mg/dL   Creatinine, Ser 9.20 0.76 - 1.27 mg/dL   eGFR 897 >40 fO/fpw/8.26   BUN/Creatinine Ratio 11 9 - 20   Sodium 138 134 - 144 mmol/L   Potassium 4.4 3.5 - 5.2 mmol/L   Chloride 99 96 - 106 mmol/L   CO2 23 20 - 29 mmol/L   Calcium 9.6 8.7 - 10.2 mg/dL   Total Protein 6.9 6.0 - 8.5 g/dL   Albumin 4.3 3.8 - 4.9 g/dL   Globulin, Total 2.6 1.5 - 4.5 g/dL   Bilirubin Total 0.3 0.0 - 1.2 mg/dL   Alkaline Phosphatase 116 47 - 123 IU/L   AST 26 0 - 40 IU/L   ALT 27 0 - 44 IU/L  CBC with Differential/Platelet   Collection Time: 07/07/24  3:04 PM  Result Value Ref Range   WBC 8.8 3.4 - 10.8 x10E3/uL   RBC 5.04 4.14 - 5.80 x10E6/uL   Hemoglobin 16.3 13.0 - 17.7 g/dL   Hematocrit 52.1 62.4 - 51.0 %   MCV 95 79 - 97 fL   MCH 32.3 26.6 - 33.0 pg   MCHC 34.1 31.5 - 35.7 g/dL   RDW 87.1 88.3 - 84.5 %   Platelets 321 150 - 450 x10E3/uL   Neutrophils 62 Not Estab. %   Lymphs 29 Not Estab. %   Monocytes 7 Not Estab. %   Eos 2 Not Estab. %   Basos 0 Not Estab. %   Neutrophils Absolute 5.4 1.4 - 7.0 x10E3/uL   Lymphocytes Absolute 2.5 0.7 - 3.1 x10E3/uL   Monocytes Absolute 0.6 0.1 - 0.9 x10E3/uL   EOS (ABSOLUTE) 0.2 0.0 - 0.4 x10E3/uL   Basophils Absolute 0.0 0.0 - 0.2 x10E3/uL   Immature Granulocytes 0 Not Estab. %  Immature Grans (Abs) 0.0 0.0 - 0.1 x10E3/uL  Lipid  Panel w/o Chol/HDL Ratio   Collection Time: 07/07/24  3:04 PM  Result Value Ref Range   Cholesterol, Total 200 (H) 100 - 199 mg/dL   Triglycerides 884 0 - 149 mg/dL   HDL 38 (L) >60 mg/dL   VLDL Cholesterol Cal 21 5 - 40 mg/dL   LDL Chol Calc (NIH) 858 (H) 0 - 99 mg/dL  PSA   Collection Time: 07/07/24  3:04 PM  Result Value Ref Range   Prostate Specific Ag, Serum 0.9 0.0 - 4.0 ng/mL  TSH   Collection Time: 07/07/24  3:04 PM  Result Value Ref Range   TSH 1.560 0.450 - 4.500 uIU/mL  Hepatitis B surface antibody,quantitative   Collection Time: 07/07/24  3:04 PM  Result Value Ref Range   Hepatitis B Surf Ab Quant <3.5 (L) Immunity>10 mIU/mL      Assessment & Plan:   Problem List Items Addressed This Visit   None Visit Diagnoses       Recurrent acute suppurative otitis media of right ear without spontaneous rupture of tympanic membrane    -  Primary   Will treat with prednisone  and doxycycline. Recheck 2 weeks to make sure it resolves. Call with any concerns.   Relevant Medications   doxycycline (VIBRA-TABS) 100 MG tablet     Snoring       Sleep study non-conclusive as he couldn't sleep. Will try to get one at home. Await results. Treat as needed.   Relevant Orders   Ambulatory referral to Sleep Studies        Follow up plan: Return in about 2 weeks (around 09/25/2024) for OK to double book- ear recheck.      "

## 2024-09-28 ENCOUNTER — Other Ambulatory Visit: Payer: Self-pay

## 2024-09-28 ENCOUNTER — Ambulatory Visit: Admitting: Family Medicine

## 2024-09-28 ENCOUNTER — Encounter: Payer: Self-pay | Admitting: Family Medicine

## 2024-09-28 VITALS — BP 149/79 | HR 82 | Temp 97.6°F | Wt 291.6 lb

## 2024-09-28 DIAGNOSIS — H6993 Unspecified Eustachian tube disorder, bilateral: Secondary | ICD-10-CM | POA: Diagnosis not present

## 2024-09-28 MED ORDER — PREDNISONE 50 MG PO TABS
50.0000 mg | ORAL_TABLET | Freq: Every day | ORAL | 0 refills | Status: AC
  Filled 2024-09-28: qty 5, 5d supply, fill #0

## 2024-09-28 MED ORDER — IBUPROFEN 600 MG PO TABS
600.0000 mg | ORAL_TABLET | Freq: Four times a day (QID) | ORAL | 1 refills | Status: AC | PRN
  Filled 2024-09-28: qty 270, 68d supply, fill #0

## 2024-09-28 NOTE — Progress Notes (Signed)
 "  BP (!) 149/79   Pulse 82   Temp 97.6 F (36.4 C) (Oral)   Wt 291 lb 9.6 oz (132.3 kg)   BMI 39.55 kg/m    Subjective:    Patient ID: Fred Tran, male    DOB: Jul 10, 1966, 59 y.o.   MRN: [id number]  HPI: Fred Tran is a 59 y.o. male  Chief Complaint  Patient presents with   Ear Pain   EAG CLOGGED Duration: weeks Involved ear(s):  bilateral Sensation of feeling clogged/plugged: yes Decreased/muffled hearing:yes Ear pain: no Fever: no Otorrhea: no Hearing loss: no Upper respiratory infection symptoms: yes Using Q-Tips: no Status: better History of cerumenosis: no Treatments attempted: doxycycline   Relevant past medical, surgical, family and social history reviewed and updated as indicated. Interim medical history since our last visit reviewed. Allergies and medications reviewed and updated.  Review of Systems  Constitutional: Negative.   HENT:  Positive for ear pain and sore throat. Negative for congestion, dental problem, drooling, ear discharge, facial swelling, hearing loss, mouth sores, nosebleeds, postnasal drip, rhinorrhea, sinus pressure, sinus pain, sneezing, tinnitus, trouble swallowing and voice change.   Respiratory: Negative.    Cardiovascular: Negative.   Musculoskeletal: Negative.   Psychiatric/Behavioral: Negative.      Per HPI unless specifically indicated above     Objective:    BP (!) 149/79   Pulse 82   Temp 97.6 F (36.4 C) (Oral)   Wt 291 lb 9.6 oz (132.3 kg)   BMI 39.55 kg/m   Wt Readings from Last 3 Encounters:  09/28/24 291 lb 9.6 oz (132.3 kg)  09/11/24 285 lb 6.4 oz (129.5 kg)  09/04/24 285 lb (129.3 kg)    Physical Exam Vitals and nursing note reviewed.  Constitutional:      General: He is not in acute distress.    Appearance: Normal appearance. He is obese. He is not ill-appearing, toxic-appearing or diaphoretic.  HENT:     Head: Normocephalic and atraumatic.     Right Ear: Tympanic membrane, ear canal and  external ear normal.     Left Ear: Tympanic membrane, ear canal and external ear normal.     Nose: Nose normal. No congestion or rhinorrhea.     Mouth/Throat:     Mouth: Mucous membranes are moist.     Pharynx: Oropharynx is clear. No oropharyngeal exudate or posterior oropharyngeal erythema.  Eyes:     General: No scleral icterus.       Right eye: No discharge.        Left eye: No discharge.     Extraocular Movements: Extraocular movements intact.     Conjunctiva/sclera: Conjunctivae normal.     Pupils: Pupils are equal, round, and reactive to light.  Cardiovascular:     Rate and Rhythm: Normal rate and regular rhythm.     Pulses: Normal pulses.     Heart sounds: Normal heart sounds. No murmur heard.    No friction rub. No gallop.  Pulmonary:     Effort: Pulmonary effort is normal. No respiratory distress.     Breath sounds: No stridor. Wheezing present. No rhonchi or rales.  Chest:     Chest wall: No tenderness.  Musculoskeletal:        General: Normal range of motion.     Cervical back: Normal range of motion and neck supple.  Skin:    General: Skin is warm and dry.     Capillary Refill: Capillary refill takes less than 2 seconds.  Coloration: Skin is not jaundiced or pale.     Findings: No bruising, erythema, lesion or rash.  Neurological:     General: No focal deficit present.     Mental Status: He is alert and oriented to person, place, and time. Mental status is at baseline.  Psychiatric:        Mood and Affect: Mood normal.        Behavior: Behavior normal.        Thought Content: Thought content normal.        Judgment: Judgment normal.     Results for orders placed or performed in visit on 07/07/24  Bayer DCA Hb A1c Waived   Collection Time: 07/07/24  3:03 PM  Result Value Ref Range   HB A1C (BAYER DCA - WAIVED) 5.8 (H) 4.8 - 5.6 %  Comprehensive metabolic panel with GFR   Collection Time: 07/07/24  3:04 PM  Result Value Ref Range   Glucose 104 (H) 70 -  99 mg/dL   BUN 9 6 - 24 mg/dL   Creatinine, Ser 9.20 0.76 - 1.27 mg/dL   eGFR 897 >40 fO/fpw/8.26   BUN/Creatinine Ratio 11 9 - 20   Sodium 138 134 - 144 mmol/L   Potassium 4.4 3.5 - 5.2 mmol/L   Chloride 99 96 - 106 mmol/L   CO2 23 20 - 29 mmol/L   Calcium 9.6 8.7 - 10.2 mg/dL   Total Protein 6.9 6.0 - 8.5 g/dL   Albumin 4.3 3.8 - 4.9 g/dL   Globulin, Total 2.6 1.5 - 4.5 g/dL   Bilirubin Total 0.3 0.0 - 1.2 mg/dL   Alkaline Phosphatase 116 47 - 123 IU/L   AST 26 0 - 40 IU/L   ALT 27 0 - 44 IU/L  CBC with Differential/Platelet   Collection Time: 07/07/24  3:04 PM  Result Value Ref Range   WBC 8.8 3.4 - 10.8 x10E3/uL   RBC 5.04 4.14 - 5.80 x10E6/uL   Hemoglobin 16.3 13.0 - 17.7 g/dL   Hematocrit 52.1 62.4 - 51.0 %   MCV 95 79 - 97 fL   MCH 32.3 26.6 - 33.0 pg   MCHC 34.1 31.5 - 35.7 g/dL   RDW 87.1 88.3 - 84.5 %   Platelets 321 150 - 450 x10E3/uL   Neutrophils 62 Not Estab. %   Lymphs 29 Not Estab. %   Monocytes 7 Not Estab. %   Eos 2 Not Estab. %   Basos 0 Not Estab. %   Neutrophils Absolute 5.4 1.4 - 7.0 x10E3/uL   Lymphocytes Absolute 2.5 0.7 - 3.1 x10E3/uL   Monocytes Absolute 0.6 0.1 - 0.9 x10E3/uL   EOS (ABSOLUTE) 0.2 0.0 - 0.4 x10E3/uL   Basophils Absolute 0.0 0.0 - 0.2 x10E3/uL   Immature Granulocytes 0 Not Estab. %   Immature Grans (Abs) 0.0 0.0 - 0.1 x10E3/uL  Lipid Panel w/o Chol/HDL Ratio   Collection Time: 07/07/24  3:04 PM  Result Value Ref Range   Cholesterol, Total 200 (H) 100 - 199 mg/dL   Triglycerides 884 0 - 149 mg/dL   HDL 38 (L) >60 mg/dL   VLDL Cholesterol Cal 21 5 - 40 mg/dL   LDL Chol Calc (NIH) 858 (H) 0 - 99 mg/dL  PSA   Collection Time: 07/07/24  3:04 PM  Result Value Ref Range   Prostate Specific Ag, Serum 0.9 0.0 - 4.0 ng/mL  TSH   Collection Time: 07/07/24  3:04 PM  Result Value Ref Range   TSH  1.560 0.450 - 4.500 uIU/mL  Hepatitis B surface antibody,quantitative   Collection Time: 07/07/24  3:04 PM  Result Value Ref Range    Hepatitis B Surf Ab Quant <3.5 (L) Immunity>10 mIU/mL      Assessment & Plan:   Problem List Items Addressed This Visit   None Visit Diagnoses       Dysfunction of both eustachian tubes    -  Primary   Will treat with prednisone  burst. Call with any concerns. Otitis media resolved.        Follow up plan: Return if symptoms worsen or fail to improve.      "

## 2024-09-29 ENCOUNTER — Encounter: Payer: Self-pay | Admitting: Family Medicine

## 2024-10-13 NOTE — Telephone Encounter (Signed)
 Scheduled

## 2024-10-13 NOTE — Telephone Encounter (Signed)
 appt

## 2024-10-29 ENCOUNTER — Ambulatory Visit: Admitting: Family Medicine
